# Patient Record
Sex: Female | Born: 1935 | Race: White | Hispanic: No | Marital: Married | Smoking: Never smoker
Health system: Southern US, Community
[De-identification: ages and names within clinical notes are randomized; demographics above are authoritative.]

## PROBLEM LIST (undated history)

## (undated) DIAGNOSIS — K8070 Calculus of gallbladder and bile duct without cholecystitis without obstruction: Secondary | ICD-10-CM

## (undated) DIAGNOSIS — J310 Chronic rhinitis: Secondary | ICD-10-CM

## (undated) DIAGNOSIS — H547 Unspecified visual loss: Secondary | ICD-10-CM

## (undated) DIAGNOSIS — F0390 Unspecified dementia without behavioral disturbance: Secondary | ICD-10-CM

## (undated) DIAGNOSIS — Z9889 Other specified postprocedural states: Secondary | ICD-10-CM

## (undated) DIAGNOSIS — I10 Essential (primary) hypertension: Secondary | ICD-10-CM

## (undated) DIAGNOSIS — J189 Pneumonia, unspecified organism: Secondary | ICD-10-CM

## (undated) DIAGNOSIS — J383 Other diseases of vocal cords: Secondary | ICD-10-CM

## (undated) DIAGNOSIS — A0472 Enterocolitis due to Clostridium difficile, not specified as recurrent: Secondary | ICD-10-CM

## (undated) DIAGNOSIS — H579 Unspecified disorder of eye and adnexa: Secondary | ICD-10-CM

## (undated) DIAGNOSIS — M349 Systemic sclerosis, unspecified: Secondary | ICD-10-CM

## (undated) DIAGNOSIS — H409 Unspecified glaucoma: Secondary | ICD-10-CM

## (undated) DIAGNOSIS — L719 Rosacea, unspecified: Secondary | ICD-10-CM

## (undated) DIAGNOSIS — I7300 Raynaud's syndrome without gangrene: Secondary | ICD-10-CM

## (undated) DIAGNOSIS — G250 Essential tremor: Secondary | ICD-10-CM

## (undated) HISTORY — DX: Chronic rhinitis: J31.0

## (undated) HISTORY — DX: Raynaud's syndrome without gangrene: I73.00

## (undated) HISTORY — DX: Rosacea, unspecified: L71.9

## (undated) HISTORY — DX: Calculus of gallbladder and bile duct without cholecystitis without obstruction: K80.70

## (undated) HISTORY — DX: Unspecified glaucoma: H40.9

## (undated) HISTORY — DX: Pneumonia, unspecified organism: J18.9

## (undated) HISTORY — DX: Unspecified visual loss: H54.7

## (undated) HISTORY — PX: CORNEAL TRANSPLANT: SHX108

## (undated) HISTORY — DX: Unspecified disorder of eye and adnexa: H57.9

## (undated) HISTORY — DX: Other diseases of vocal cords: J38.3

## (undated) HISTORY — DX: Other specified postprocedural states: Z98.890

## (undated) HISTORY — DX: Unspecified dementia, unspecified severity, without behavioral disturbance, psychotic disturbance, mood disturbance, and anxiety: F03.90

## (undated) HISTORY — DX: Essential tremor: G25.0

## (undated) HISTORY — DX: Enterocolitis due to Clostridium difficile, not specified as recurrent: A04.72

## (undated) HISTORY — DX: Systemic sclerosis, unspecified: M34.9

---

## 1980-12-06 HISTORY — PX: LUNG SURGERY: SHX703

## 2000-01-07 ENCOUNTER — Encounter: Payer: Self-pay | Admitting: Internal Medicine

## 2000-01-07 ENCOUNTER — Encounter: Admission: RE | Admit: 2000-01-07 | Discharge: 2000-01-07 | Payer: Self-pay | Admitting: Internal Medicine

## 2000-08-30 ENCOUNTER — Encounter (INDEPENDENT_AMBULATORY_CARE_PROVIDER_SITE_OTHER): Payer: Self-pay | Admitting: Specialist

## 2000-08-30 ENCOUNTER — Ambulatory Visit (HOSPITAL_COMMUNITY): Admission: RE | Admit: 2000-08-30 | Discharge: 2000-08-30 | Payer: Self-pay | Admitting: Obstetrics and Gynecology

## 2001-01-09 ENCOUNTER — Encounter: Admission: RE | Admit: 2001-01-09 | Discharge: 2001-01-09 | Payer: Self-pay | Admitting: Obstetrics and Gynecology

## 2001-01-09 ENCOUNTER — Encounter: Payer: Self-pay | Admitting: Obstetrics and Gynecology

## 2001-02-08 ENCOUNTER — Other Ambulatory Visit: Admission: RE | Admit: 2001-02-08 | Discharge: 2001-02-08 | Payer: Self-pay | Admitting: Obstetrics and Gynecology

## 2002-01-18 ENCOUNTER — Encounter: Admission: RE | Admit: 2002-01-18 | Discharge: 2002-01-18 | Payer: Self-pay | Admitting: Internal Medicine

## 2002-01-18 ENCOUNTER — Encounter: Payer: Self-pay | Admitting: Internal Medicine

## 2002-03-19 ENCOUNTER — Other Ambulatory Visit: Admission: RE | Admit: 2002-03-19 | Discharge: 2002-03-19 | Payer: Self-pay

## 2003-01-25 ENCOUNTER — Encounter: Payer: Self-pay | Admitting: Internal Medicine

## 2003-01-25 ENCOUNTER — Encounter: Admission: RE | Admit: 2003-01-25 | Discharge: 2003-01-25 | Payer: Self-pay | Admitting: Internal Medicine

## 2003-05-27 ENCOUNTER — Other Ambulatory Visit: Admission: RE | Admit: 2003-05-27 | Discharge: 2003-05-27 | Payer: Self-pay

## 2004-01-28 ENCOUNTER — Ambulatory Visit (HOSPITAL_COMMUNITY): Admission: RE | Admit: 2004-01-28 | Discharge: 2004-01-28 | Payer: Self-pay | Admitting: Obstetrics and Gynecology

## 2004-08-17 ENCOUNTER — Other Ambulatory Visit: Admission: RE | Admit: 2004-08-17 | Discharge: 2004-08-17 | Payer: Self-pay | Admitting: Obstetrics and Gynecology

## 2005-01-28 ENCOUNTER — Ambulatory Visit (HOSPITAL_COMMUNITY): Admission: RE | Admit: 2005-01-28 | Discharge: 2005-01-28 | Payer: Self-pay | Admitting: Internal Medicine

## 2005-02-03 ENCOUNTER — Encounter: Admission: RE | Admit: 2005-02-03 | Discharge: 2005-02-03 | Payer: Self-pay | Admitting: Internal Medicine

## 2005-08-26 ENCOUNTER — Other Ambulatory Visit: Admission: RE | Admit: 2005-08-26 | Discharge: 2005-08-26 | Payer: Self-pay | Admitting: Obstetrics and Gynecology

## 2005-12-06 DIAGNOSIS — J189 Pneumonia, unspecified organism: Secondary | ICD-10-CM

## 2005-12-06 DIAGNOSIS — K8070 Calculus of gallbladder and bile duct without cholecystitis without obstruction: Secondary | ICD-10-CM

## 2005-12-06 HISTORY — DX: Calculus of gallbladder and bile duct without cholecystitis without obstruction: K80.70

## 2005-12-06 HISTORY — DX: Pneumonia, unspecified organism: J18.9

## 2006-01-27 ENCOUNTER — Encounter: Admission: RE | Admit: 2006-01-27 | Discharge: 2006-01-27 | Payer: Self-pay | Admitting: Internal Medicine

## 2006-02-07 ENCOUNTER — Encounter: Admission: RE | Admit: 2006-02-07 | Discharge: 2006-02-07 | Payer: Self-pay | Admitting: Internal Medicine

## 2006-07-24 ENCOUNTER — Emergency Department (HOSPITAL_COMMUNITY): Admission: EM | Admit: 2006-07-24 | Discharge: 2006-07-24 | Payer: Self-pay | Admitting: Emergency Medicine

## 2006-08-29 ENCOUNTER — Other Ambulatory Visit: Admission: RE | Admit: 2006-08-29 | Discharge: 2006-08-29 | Payer: Self-pay | Admitting: Obstetrics and Gynecology

## 2006-10-06 DIAGNOSIS — A0472 Enterocolitis due to Clostridium difficile, not specified as recurrent: Secondary | ICD-10-CM

## 2006-10-06 HISTORY — DX: Enterocolitis due to Clostridium difficile, not specified as recurrent: A04.72

## 2007-02-09 ENCOUNTER — Encounter: Admission: RE | Admit: 2007-02-09 | Discharge: 2007-02-09 | Payer: Self-pay | Admitting: Internal Medicine

## 2007-10-30 ENCOUNTER — Encounter: Admission: RE | Admit: 2007-10-30 | Discharge: 2007-10-30 | Payer: Self-pay | Admitting: Internal Medicine

## 2008-03-13 ENCOUNTER — Encounter: Admission: RE | Admit: 2008-03-13 | Discharge: 2008-03-13 | Payer: Self-pay | Admitting: Internal Medicine

## 2009-03-14 ENCOUNTER — Encounter: Admission: RE | Admit: 2009-03-14 | Discharge: 2009-03-14 | Payer: Self-pay | Admitting: Internal Medicine

## 2009-05-01 ENCOUNTER — Emergency Department (HOSPITAL_COMMUNITY): Admission: EM | Admit: 2009-05-01 | Discharge: 2009-05-01 | Payer: Self-pay | Admitting: Emergency Medicine

## 2009-08-22 ENCOUNTER — Encounter: Admission: RE | Admit: 2009-08-22 | Discharge: 2009-08-22 | Payer: Self-pay | Admitting: Neurology

## 2010-04-06 ENCOUNTER — Encounter: Admission: RE | Admit: 2010-04-06 | Discharge: 2010-04-06 | Payer: Self-pay | Admitting: Internal Medicine

## 2010-08-14 ENCOUNTER — Ambulatory Visit (HOSPITAL_COMMUNITY): Admission: RE | Admit: 2010-08-14 | Discharge: 2010-08-14 | Payer: Self-pay | Admitting: Family Medicine

## 2010-08-21 ENCOUNTER — Encounter: Admission: RE | Admit: 2010-08-21 | Discharge: 2010-08-21 | Payer: Self-pay | Admitting: Otolaryngology

## 2010-09-05 DIAGNOSIS — Z9889 Other specified postprocedural states: Secondary | ICD-10-CM

## 2010-09-05 HISTORY — DX: Other specified postprocedural states: Z98.890

## 2010-09-10 ENCOUNTER — Inpatient Hospital Stay (HOSPITAL_BASED_OUTPATIENT_CLINIC_OR_DEPARTMENT_OTHER): Admission: RE | Admit: 2010-09-10 | Discharge: 2010-09-10 | Payer: Self-pay | Admitting: Cardiology

## 2010-12-27 ENCOUNTER — Encounter: Payer: Self-pay | Admitting: Internal Medicine

## 2011-02-18 LAB — POCT I-STAT 3, VENOUS BLOOD GAS (G3P V)
Acid-Base Excess: 1 mmol/L (ref 0.0–2.0)
Acid-base deficit: 1 mmol/L (ref 0.0–2.0)
Bicarbonate: 25.2 mEq/L — ABNORMAL HIGH (ref 20.0–24.0)
O2 Saturation: 71 %
TCO2: 27 mmol/L (ref 0–100)
TCO2: 27 mmol/L (ref 0–100)
pH, Ven: 7.358 — ABNORMAL HIGH (ref 7.250–7.300)
pO2, Ven: 36 mmHg (ref 30.0–45.0)
pO2, Ven: 38 mmHg (ref 30.0–45.0)

## 2011-03-15 ENCOUNTER — Other Ambulatory Visit: Payer: Self-pay | Admitting: Internal Medicine

## 2011-03-15 DIAGNOSIS — Z1231 Encounter for screening mammogram for malignant neoplasm of breast: Secondary | ICD-10-CM

## 2011-04-08 ENCOUNTER — Ambulatory Visit
Admission: RE | Admit: 2011-04-08 | Discharge: 2011-04-08 | Disposition: A | Discharge status: Home / Self Care | Payer: Medicare Other | Source: Ambulatory Visit | Attending: Internal Medicine | Admitting: Internal Medicine

## 2011-04-08 DIAGNOSIS — Z1231 Encounter for screening mammogram for malignant neoplasm of breast: Secondary | ICD-10-CM

## 2011-04-23 NOTE — H&P (Signed)
[facility]  Patient:    Audrey Obrien, Audrey Obrien                          MRN: [id number] Attending:  Esmeralda Arthur, M.D.                         History and Physical  HISTORY OF PRESENT ILLNESS:   This is a 77 year old female, para 2-0-2, who is admitted to the hospital for hysteroscopy and D&C for irregular bleeding. This patient takes Premarin and Provera and had an ultrasound done in July of 2000 which showed a solid endometrial stripe of 3 mm.  The ovaries were normal size.  It was hard to visualize.  Because she has had spotting again, she was admitted for tissue evaluation.  ALLERGIES:                    No known drug allergies.  Her last Pap smear was in March of 2001 which was reported as normal.  Her last period was August 16, 2000.  She has had no abnormal spotting since then.  She has had no hot flushes.  MEDICATIONS:                  1. Premarin 0.625 everyday.                               2. Medroxyprogesterone 10 mg 1 through 10.                               3. Plendil 10 mg everyday.                               4. Propanolol 80 mg everyday for blood pressure.                               5. Medicine for her eyes.  She has had no surgery since her last visit.  REVIEW OF SYSTEMS:            Basically within normal limits except her hypertension.  PERSONAL PAST HISTORY:        She used to take medicine for blood pressure. SHe has glaucoma.  FAMILY HISTORY:               Her mother had a stroke at age 48.  She has sisters and the mother had high blood pressure.  There is no history of cancer.  PHYSICAL EXAMINATION:  GENERAL:                      This is a well-developed, well-nourished female, oriented and alert.  VITAL SIGNS:                  Blood pressure 150/70, weight 130 pounds.  HEART:                        Normal sinus rhythm.  LUNGS:                        Clear to auscultation and percussion.  BREASTS:  Type IV without masses.  ABDOMEN:                      Liver is not palpable.  Spleen is not palpable.  PELVIC:                       External genitalia within normal limits.  The vagina has some slight estrogen effect.  She has a cystocele.  She has a minimal rectocele.  Her cervix is epithelialized.  Uterus feels normal size. Ovaries are not palpable.  Adnexa without masses.  IMPRESSION:                   Irregular bleeding, postmenopausal, on hormone replacement therapy, hypertensive cardiovascular disease on treatment.  DISPOSITION:                  Admit for hysteroscopy and D&C. DD:  08/29/00 TD:  08/29/00 Job: [id number]

## 2011-04-23 NOTE — Op Note (Signed)
[facility]  Patient:    Audrey Obrien, Audrey Obrien                        MRN: [id number] Proc. Date: 08/30/00 Adm. Date:  [id number] Disc. Date: [id number] Attending:  Amanda Cockayne                           Operative Report  PREOPERATIVE DIAGNOSIS:  Irregular bleeding, postmenopausal, hormone replacement therapy, slightly enlarged uterus.  POSTOPERATIVE DIAGNOSIS:  Irregular bleeding, postmenopausal, hormone replacement therapy, slightly enlarged uterus.  OPERATION PERFORMED:  SURGEON:  Esmeralda Arthur, M.D.  ANESTHESIA:  General.  PACKS:  None.  CATHETERS:  None.  MEDIUM:  Sorbitol deficit 30 cc.  ESTIMATED BLOOD LOSS:  30 cc.  OPERATIVE FINDINGS:  The patients uterus sounded to 80 cm.  She had loose endometrial tissue ____________ polyps and she had an irregular lining that causes her to bleed.  DESCRIPTION OF PROCEDURE:  The patient was taken to the operating room and after satisfactory anesthesia, placed in lithotomy position, prepped and draped in sterile field.  The bladder was emptied by catheterization.  The patients uterus was felt to be midlevel.  A weighted speculum was placed in the posterior vagina, cervix grasped with a tenaculum and the uterus sounded to 8 cm easily.  We then dilated to a #25 Hanks dilator and inserted the hysteroscope.  The tubal openings were negative.  They had good opening.  She had three or four areas of loose endometrial tissue which could have caused her to have bleeding.  I could not definitely see a polyp.  I then explored the uterus with Randall stone grasping forceps and did endometrial curettage with a small amount of tissue obtained.  I then looked again with the scope and it looked like she had a polyp on her left which was probably from doing the curettage.  We then removed some tissue on the left.  After we looked again and half the tissues removed.  I plan to see what the  pathology report is of this and cycle the patient cycly for two or three months to see if she can shed the lining and put her back on her original medication. DD:  08/30/00 TD:  08/31/00 Job: [id number]

## 2011-07-06 ENCOUNTER — Other Ambulatory Visit: Payer: Self-pay | Admitting: Geriatric Medicine

## 2011-07-06 ENCOUNTER — Ambulatory Visit
Admission: RE | Admit: 2011-07-06 | Discharge: 2011-07-06 | Disposition: A | Discharge status: Home / Self Care | Payer: Medicare Other | Source: Ambulatory Visit | Attending: Geriatric Medicine | Admitting: Geriatric Medicine

## 2011-07-06 MED ORDER — IOHEXOL 300 MG/ML  SOLN
100.0000 mL | Freq: Once | INTRAMUSCULAR | Status: AC | PRN
  Administered 2011-07-06: 100 mL via INTRAVENOUS

## 2011-09-02 ENCOUNTER — Emergency Department (HOSPITAL_COMMUNITY): Payer: Medicare Other

## 2011-09-02 ENCOUNTER — Encounter (HOSPITAL_COMMUNITY): Payer: Self-pay | Admitting: Radiology

## 2011-09-02 ENCOUNTER — Inpatient Hospital Stay (HOSPITAL_COMMUNITY)
Admission: EM | Admit: 2011-09-02 | Discharge: 2011-09-03 | DRG: 192 | Disposition: A | Discharge status: Home / Self Care | Payer: Medicare Other | Attending: Internal Medicine | Admitting: Internal Medicine

## 2011-09-02 DIAGNOSIS — J479 Bronchiectasis, uncomplicated: Principal | ICD-10-CM | POA: Diagnosis present

## 2011-09-02 DIAGNOSIS — R05 Cough: Secondary | ICD-10-CM

## 2011-09-02 DIAGNOSIS — K219 Gastro-esophageal reflux disease without esophagitis: Secondary | ICD-10-CM | POA: Diagnosis present

## 2011-09-02 DIAGNOSIS — I10 Essential (primary) hypertension: Secondary | ICD-10-CM | POA: Diagnosis present

## 2011-09-02 DIAGNOSIS — M810 Age-related osteoporosis without current pathological fracture: Secondary | ICD-10-CM | POA: Diagnosis present

## 2011-09-02 DIAGNOSIS — G250 Essential tremor: Secondary | ICD-10-CM | POA: Diagnosis present

## 2011-09-02 DIAGNOSIS — Z79899 Other long term (current) drug therapy: Secondary | ICD-10-CM

## 2011-09-02 DIAGNOSIS — G252 Other specified forms of tremor: Secondary | ICD-10-CM | POA: Diagnosis present

## 2011-09-02 DIAGNOSIS — R042 Hemoptysis: Secondary | ICD-10-CM

## 2011-09-02 DIAGNOSIS — R911 Solitary pulmonary nodule: Secondary | ICD-10-CM

## 2011-09-02 DIAGNOSIS — M349 Systemic sclerosis, unspecified: Secondary | ICD-10-CM | POA: Diagnosis present

## 2011-09-02 HISTORY — DX: Essential (primary) hypertension: I10

## 2011-09-02 LAB — POCT I-STAT, CHEM 8
Calcium, Ion: 1.24 mmol/L (ref 1.12–1.32)
Creatinine, Ser: 0.8 mg/dL (ref 0.50–1.10)
Hemoglobin: 11.6 g/dL — ABNORMAL LOW (ref 12.0–15.0)
Sodium: 130 mEq/L — ABNORMAL LOW (ref 135–145)
TCO2: 23 mmol/L (ref 0–100)

## 2011-09-02 LAB — CBC
Hemoglobin: 10.9 g/dL — ABNORMAL LOW (ref 12.0–15.0)
MCH: 32.6 pg (ref 26.0–34.0)
MCHC: 35.4 g/dL (ref 30.0–36.0)
Platelets: 269 10*3/uL (ref 150–400)
RDW: 13.6 % (ref 11.5–15.5)

## 2011-09-02 LAB — COMPREHENSIVE METABOLIC PANEL
Albumin: 3.6 g/dL (ref 3.5–5.2)
Alkaline Phosphatase: 95 U/L (ref 39–117)
BUN: 13 mg/dL (ref 6–23)
Creatinine, Ser: 0.6 mg/dL (ref 0.50–1.10)
GFR calc Af Amer: >60 mL/min (ref >60)
Glucose, Bld: 96 mg/dL (ref 70–99)
Total Protein: 7.2 g/dL (ref 6.0–8.3)

## 2011-09-02 LAB — DIFFERENTIAL
Basophils Absolute: 0 10*3/uL (ref 0.0–0.1)
Basophils Relative: 0 % (ref 0–1)
Eosinophils Absolute: 0.2 10*3/uL (ref 0.0–0.7)
Eosinophils Relative: 3 % (ref 0–5)
Monocytes Absolute: 1.1 10*3/uL — ABNORMAL HIGH (ref 0.1–1.0)
Monocytes Relative: 17 % — ABNORMAL HIGH (ref 3–12)
Neutro Abs: 4.6 10*3/uL (ref 1.7–7.7)

## 2011-09-02 LAB — TYPE AND SCREEN
ABO/RH(D): A POS
Antibody Screen: NEGATIVE

## 2011-09-02 LAB — PROTIME-INR
INR: 1.03 (ref 0.00–1.49)
Prothrombin Time: 13.7 seconds (ref 11.6–15.2)

## 2011-09-02 MED ORDER — IOHEXOL 300 MG/ML  SOLN
80.0000 mL | Freq: Once | INTRAMUSCULAR | Status: AC | PRN
  Administered 2011-09-02: 80 mL via INTRAVENOUS

## 2011-09-03 ENCOUNTER — Encounter (HOSPITAL_COMMUNITY): Payer: Medicare Other

## 2011-09-03 ENCOUNTER — Other Ambulatory Visit: Payer: Self-pay | Admitting: Emergency Medicine

## 2011-09-03 ENCOUNTER — Inpatient Hospital Stay (HOSPITAL_COMMUNITY): Payer: Medicare Other

## 2011-09-03 ENCOUNTER — Other Ambulatory Visit: Payer: Self-pay | Admitting: Internal Medicine

## 2011-09-03 LAB — BASIC METABOLIC PANEL
BUN: 16 mg/dL (ref 6–23)
CO2: 25 mEq/L (ref 19–32)
Chloride: 94 mEq/L — ABNORMAL LOW (ref 96–112)
GFR calc non Af Amer: >60 mL/min (ref >60)
Glucose, Bld: 103 mg/dL — ABNORMAL HIGH (ref 70–99)
Potassium: 4 mEq/L (ref 3.5–5.1)

## 2011-09-03 LAB — CBC
Hemoglobin: 12.2 g/dL (ref 12.0–15.0)
MCH: 32.5 pg (ref 26.0–34.0)
MCHC: 35.4 g/dL (ref 30.0–36.0)
MCV: 92 fL (ref 78.0–100.0)
Platelets: 297 10*3/uL (ref 150–400)
RBC: 3.75 MIL/uL — ABNORMAL LOW (ref 3.87–5.11)

## 2011-09-03 LAB — SURGICAL PCR SCREEN: MRSA, PCR: NEGATIVE

## 2011-09-03 LAB — ABO/RH: ABO/RH(D): A POS

## 2011-09-03 LAB — EXPECTORATED SPUTUM ASSESSMENT W GRAM STAIN, RFLX TO RESP C

## 2011-09-06 LAB — CULTURE, RESPIRATORY W GRAM STAIN

## 2011-09-10 NOTE — Op Note (Signed)
NAMEEVEY, MCMAHAN NO.:  [id number]  MEDICAL RECORD NO.:  [id number]  LOCATION:  5025                         FACILITY:  [facility]  PHYSICIAN:  Leslye Peer, MD    DATE OF BIRTH:  [birth year]  DATE OF PROCEDURE:  09/03/2011 DATE OF DISCHARGE:                              OPERATIVE REPORT   PROCEDURE:  Video fiberoptic bronchoscopy with bronchoalveolar lavage.  INDICATIONS:  Hemoptysis with abnormal CT scan of the chest.  OPERATOR:  Leslye Peer, MD  MEDICATIONS GIVEN:  Fentanyl 25 mcg IV, Versed 3 mg IV in divided doses, lidocaine 1% to the bronchoalveolar tree.  CONSENT:  Informed consent was obtained from the patient and a signed copy was on her hospital chart.  PROCEDURE DETAILS:  After informed consent was obtained, a formal time- out was performed and then conscious sedation was initiated as outlined above.  The bronchoscope was introduced into both nares to confirm that there was no evidence of epistaxis, no bleeding, either active or evidence for old bleeding was noted.  The bronchoscope was then introduced through the oropharynx and the posterior pharynx was visualized.  This was normal in appearance with some mild posterior pharyngeal cobblestoning.  The glottis and epiglottis were normal.  The false and true cords moved normally with inspiration and with phonation. The area was anesthetized with 1% lidocaine and then the trachea was intubated.  The proximal trachea was normal in appearance.  The bronchoscope was advanced to the main carina which was sharp without any significant abnormality.  Inspection of the left side showed a normal left mainstem bronchus.  The left upper lobe and lingular airways were all within normal limits.  There was a small amount of old blood noted in the left lower lobe which was easily suctioned away and there was no reaccumulation.  I suspect that this was blood-tinged mucous that had originated from elsewhere.   Attention was then turned to the right-sided exam.  The right mainstem bronchus was normal.  The right upper lobe had some thick old blood emanating from the posterior subsegment.  The right middle lobe and right lower lobe airways were completely normal.  The blood emanating from the posterior segment of the right upper lobe was suctioned without difficulty.  No endobronchial lesions could be seen. Once the blood was removed, some evidence of old blood emanating from the airways was noted but the scope would not pass into the smaller caliber airways to see its source.  A bronchoalveolar lavage was performed in the posterior segment of the right upper lobe to be sent for cytology and microbiology including AFB and fungal smears, 60 mL of normal saline was instilled and 20 mL was returned.  After the bronchoalveolar lavage, some fresh bright red blood was noted emanating from the posterior segmental airway.  This was suctioned away and there was good hemostasis at the end of the case.  No biopsies were performed. The patient tolerated the procedure well with the exception of some coughing.  There were no obvious complications.  She was recovered in the endoscopy suite before returning to her hospital room.  SAMPLES:  Bronchoalveolar lavage from the  posterior segment of the right upper lobe to be sent for AFB smear and culture, fungal smear and culture, bacterial culture, and cytology.  PLAN:  I would like to follow up the bronchoalveolar lavage results with Ms. Goodspeed when they mature.  I will follow up with her in the hospital if she remains an inpatient.  If she is to be discharged from the hospital before the results are final, please arrange for her to follow up with Dr. Levy Pupa at [facility] to go over these final results in detail.  The phone number is [phone].     Leslye Peer, MD     RSB/MEDQ  D:  09/03/2011  T:  09/03/2011  Job:  [id number]  Electronically  Signed by Levy Pupa MD on 09/10/2011 02:06:48 PM

## 2011-09-14 ENCOUNTER — Encounter: Payer: Self-pay | Admitting: Pulmonary Disease

## 2011-09-15 ENCOUNTER — Ambulatory Visit (INDEPENDENT_AMBULATORY_CARE_PROVIDER_SITE_OTHER): Payer: Medicare Other | Admitting: Emergency Medicine

## 2011-09-15 ENCOUNTER — Encounter: Payer: Self-pay | Admitting: Emergency Medicine

## 2011-09-15 DIAGNOSIS — R05 Cough: Secondary | ICD-10-CM

## 2011-09-15 DIAGNOSIS — J479 Bronchiectasis, uncomplicated: Secondary | ICD-10-CM

## 2011-09-15 DIAGNOSIS — K219 Gastro-esophageal reflux disease without esophagitis: Secondary | ICD-10-CM

## 2011-09-15 DIAGNOSIS — J309 Allergic rhinitis, unspecified: Secondary | ICD-10-CM | POA: Insufficient documentation

## 2011-09-15 MED ORDER — LORATADINE 10 MG PO TABS: 10.0000 mg | ORAL_TABLET | Freq: Every day | ORAL | Status: DC

## 2011-09-15 MED ORDER — FLUTICASONE PROPIONATE 50 MCG/ACT NA SUSP: 2.0000 | Freq: Two times a day (BID) | NASAL | Status: DC

## 2011-09-15 NOTE — Assessment & Plan Note (Signed)
-   follow cx data to completion; I believe the pseudomonas is a colonizer, want to r/o [facility] - repeat CT scan in march 2013  - rov after

## 2011-09-15 NOTE — Progress Notes (Signed)
Subjective:    Patient ID: Audrey Obrien, female    DOB: 11/16/1931, 77 y.o.   MRN: [id number]  HPI 77 yo never smoker w hx scleroderma, HTN, GERD, chronic cough (2-3 yrs). 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 I saw her in the hospital 9/27 for cough and hemoptysis. CT scan of the chest 9/27 showed nodular parenchymal opacity within the RUL with a  'tree in bud ' appearance, progressed compared with 06/2011. FOB done 9/28 showed old blood in the RUL posterior segment, BAL has been smear negative for AFB and fungal (final cx pending). The bacterial cx grew sensitive pseudomonas. Cytology on BAL negative. She has nasal gtt and a lot of throat mucous. Has tried nasonex before, not sure it helped. Not currently on anything. Started mucinex a week ago.    Review of Systems  Constitutional: Negative.  Negative for fever and unexpected weight change.  HENT: Positive for congestion. Negative for ear pain, nosebleeds, sore throat, rhinorrhea, sneezing, trouble swallowing, dental problem, postnasal drip and sinus pressure.   Eyes: Negative.  Negative for redness and itching.  Respiratory: Positive for cough. Negative for chest tightness, shortness of breath and wheezing.   Cardiovascular: Negative.  Negative for palpitations and leg swelling.  Gastrointestinal: Negative.  Negative for nausea and vomiting.  Genitourinary: Negative.  Negative for dysuria.  Musculoskeletal: Positive for joint swelling and arthralgias.  Skin: Negative.  Negative for rash.  Neurological: Negative.  Negative for headaches.  Hematological: Negative.  Does not bruise/bleed easily.  Psychiatric/Behavioral: Negative.  Negative for dysphoric mood. The patient is not nervous/anxious.     Past Medical History  Diagnosis Date  . Hypertension   . Acne rosacea   . Chronic rhinitis     no benefit from antihistamines  . Gallbladder & bile duct stone 2007    1.6 cm GB  stone; no symptoms  . Pneumonia 2007  . 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 C. difficile colitis 11/07  . Spastic dysphonia     botox injs at [facility]  . Hypertension   . Scleroderma     probable, ANA+ 1:160, anticentromers strongly pos, USRNP-, TH/TO-,RF equivocal, CCP-, Skin puffiness of hands, telangectasias of hands, raynauds, ECHO increased pulmonary pressures 8/11, pfts stable  . S/P right heart catheterization 10/11  . Benign essential tremor   . Osteoporosis     started alendronate 6/12     Family History  Problem Relation Age of Onset  . Asthma Son      History   Social History  . Marital Status: Married    Spouse Name: N/A    Number of Children: N/A  . Years of Education: N/A   Occupational History  . retired Ship broker   Social History Main Topics  . Smoking status: Never Smoker   . Smokeless tobacco: Not on file  . Alcohol Use: No  . Drug Use: No  . Sexually Active: Not on file   Other Topics Concern  . Not on file   Social History Narrative  . No narrative on file     No Known Allergies   Outpatient Prescriptions Prior to Visit  Medication Sig Dispense Refill  . alendronate (FOSAMAX) 70 MG tablet Take 70 mg by mouth every 7 (seven) days. Take with a full glass of water on an empty stomach.       . Ascorbic Acid (  VITAMIN C) 1000 MG tablet Take 1,000 mg by mouth daily.        Marland Kitchen aspirin 81 MG chewable tablet Chew 81 mg by mouth daily.        . Calcium Carbonate-Vitamin D (CALTRATE 600+D) 600-400 MG-UNIT per tablet Take 2 tablets by mouth daily.        . Cholecalciferol (VITAMIN D) 1000 UNITS capsule Take 1,000 Units by mouth daily.        . Cranberry (CRAN-MAX) 500 MG CAPS Take by mouth. Take as directed       . esomeprazole (NEXIUM) 40 MG capsule Take 40 mg by mouth daily.        . fish oil-omega-3 fatty acids 1000 MG capsule Take 1 g by mouth daily.        Marland Kitchen METRONIDAZOLE, TOPICAL, (METROLOTION) 0.75 % LOTN Apply topically.  As directed twice a day       . Multiple Vitamin (MULTIVITAMIN) tablet Take 1 tablet by mouth daily.        . nisoldipine (SULAR) 40 MG 24 hr tablet Take 40 mg by mouth daily.        . primidone (MYSOLINE) 50 MG tablet Take 50 mg by mouth. 2 tabs once a day       . propranolol (INDERAL LA) 80 MG 24 hr capsule Take 80 mg by mouth. 2 caps once a day       . telmisartan (MICARDIS) 80 MG tablet Take 80 mg by mouth daily.               Objective:   Physical Exam  Gen: Pleasant, well-nourished, in no distress,  normal affect  ENT: No lesions,  mouth clear,  oropharynx clear, gravel voice quality, some postnasal drip  Neck: No JVD, no TMG, no carotid bruits  Lungs: No use of accessory muscles, no dullness to percussion, clear without rales or rhonchi  Cardiovascular: RRR, heart sounds normal, no murmur or gallops, no peripheral edema  Musculoskeletal: No deformities, no cyanosis or clubbing  Neuro: alert, non focal, slightly slow to respond, mild tremor.   Skin: Warm, no lesions or rashes      Assessment & Plan:  Bronchiectasis without acute exacerbation - follow cx data to completion; I believe the pseudomonas is a colonizer, want to r/o [facility] - repeat CT scan in march 2013  - rov after  Chronic cough Suspect a contribution from allergic rhinitis and draiinage. ? Also a component of aspiration and poor airway protection after her botox injections - the cough seemed to start when the botox was started. The RUL bronchiectasis is likely also a factor.   Chronic allergic rhinitis - guifenesin - start loratadine and fluticasone - start [place]  GERD (gastroesophageal reflux disease) - nexium

## 2011-09-15 NOTE — Patient Instructions (Signed)
Continue your Mucinex Start loratadine 10mg  daily Start fluticasone 2 sprays each nostril twice a day Start nasal saline washes once a day We will repeat your CT scan in March 2013 Follow up with Dr Delton Coombes in march after your CT scan or sooner if you notice worsening symptoms, cough up blood.

## 2011-09-15 NOTE — Assessment & Plan Note (Signed)
nexium 

## 2011-09-15 NOTE — Assessment & Plan Note (Signed)
Suspect a contribution from allergic rhinitis and draiinage. ? Also a component of aspiration and poor airway protection after her botox injections - the cough seemed to start when the botox was started. The RUL bronchiectasis is likely also a factor.

## 2011-09-15 NOTE — Assessment & Plan Note (Signed)
-   guifenesin - start loratadine and fluticasone - start [employer]

## 2011-09-16 NOTE — Discharge Summary (Signed)
  NAMEANHAR, MCDERMOTT NO.:  [id number]  MEDICAL RECORD NO.:  [id number]  LOCATION:  5025                         FACILITY:  [facility]  PHYSICIAN:  Gordy Savers, MDDATE OF BIRTH:  12/02/1931  DATE OF ADMISSION:  09/02/2011 DATE OF DISCHARGE:  09/03/2011                              DISCHARGE SUMMARY   FINAL DIAGNOSES: 1. Hemoptysis. 2. Bronchiectasis.  ADDITIONAL DIAGNOSES: 1. Scleroderma. 2. Gastroesophageal flux disease. 3. Osteoporosis. 4. Chronic benign essential tremor. 5. Hypertension.  DISCHARGE MEDICATIONS: 1. Fosamax 70 mg weekly. 2. Primidone 50 mg one b.i.d. 3. Inderal 80 mg b.i.d. 4. Nexium 40 mg daily. 5. Sular 40 mg daily. 6. Micardis 80 mg daily.  HISTORY OF PRESENT ILLNESS:  The patient is a 77 year old female with past medical history of scleroderma, diagnosed in 2010.  She has a history of chronic cough and suspected bronchiectasis and began having hemoptysis a few days prior to admission.  This was approximated to be approximately a tablespoon in volume, described as bright red, and associated with coughing.  The patient had been seen as an outpatient and evaluation included a chest x-ray and chest CT that revealed scattered airspace disease, and findings consistent with bronchiectasis. The patient was subsequently admitted for further evaluation and treatment of her hemoptysis.  LABORATORY DATA AND HOSPITAL COURSE:  On the second hospital day, the patient underwent fiberoptic bronchoscopy without complications.  There was scattered old blood present in several bronchial segments.  Multiple specimens for culture and cytology obtained.  BAL was performed.  There is no active bleeding.  Laboratory studies revealed a serum sodium of 130, H and H 12.2, hematocrit 34.5, white count was normal at 6.7. Chemistries were unremarkable.  The patient was observed several hours post her bronchoscopy and remained clinically stable.  At  the time of discharge, bronchoscopy specimens for cytology, routine culture, fungal culture pending.  AFB culture pending.  AFB stain was negative for acid-fast bacilli.  DISPOSITION:  The patient will be discharged today with Pulmonary followup at [facility] within the next week.  She will report any further hemoptysis and will report to the ED if there is a large volume hemoptysis.  She will be discharged on her preadmission medications.     Gordy Savers, MD     PFK/MEDQ  D:  09/03/2011  T:  09/03/2011  Job:  [id number]  Electronically Signed by Eleonore Chiquito MD on 09/16/2011 09:14:48 AM

## 2011-09-17 ENCOUNTER — Telehealth: Payer: Self-pay | Admitting: Emergency Medicine

## 2011-09-17 NOTE — Telephone Encounter (Signed)
I spoke with Audrey Obrien and they are wanting the okay from Audrey Obrien for Audrey Obrien to get Audrey flu shot. I advised them he is not back in until 10/29. Audrey Obrien Obrien was fine with that bc they want the okay from Audrey Obrien. Dr. Delton Coombes, please advise thanks  Carver Fila, CMA

## 2011-09-18 NOTE — Discharge Summary (Signed)
  Audrey Obrien, HECKMANN NO.:  [id number]  MEDICAL RECORD NO.:  [id number]  LOCATION:  5025                         FACILITY:  [facility]  PHYSICIAN:  Theressa Millard, M.D.    DATE OF BIRTH:  [birth year]  DATE OF ADMISSION:  09/02/2011 DATE OF DISCHARGE:  09/03/2011                              DISCHARGE SUMMARY   ADMITTING DIAGNOSIS:  Hemoptysis.  DISCHARGE DIAGNOSES: 1. Hemoptysis secondary to right upper lobe process, status post     bronchoscopy with cultures pending. 2. Scleroderma. 3. Spastic dysphonia. 4. Hypertension. 5. Osteoporosis.  The patient is a 77 year old white female who has had a history of right upper lobe process that is being followed in the office.  She had scant hemoptysis and presented to the emergency department.  She was admitted for expedited a bronchoscopy due to hemoptysis.  She was seen in consultation by Dr. Delton Coombes who agreed the patient needed to undergo bronchoscopy and this was done on the morning of discharge. The bronchoscopy was well tolerated and initial AFB smear was negative. Therefore, she was discharged in improved condition to followup with Dr. Delton Coombes next week.  DISCHARGE MEDICATIONS: 1. Fosamax 70 mg weekly. 2. Primidone 50 mg b.i.d. 3. Inderal 80 mg b.i.d. 4. Nexium 40 mg daily. 5. Sular 40 mg daily. 6. Micardis 80 mg daily.  She will follow up with Dr. Delton Coombes next week.  She will follow up with Dr. Earl Gala as well and that appointment has been scheduled.  ACTIVITY:  As tolerated.  DIET:  No restrictions.     Theressa Millard, M.D.     JO/MEDQ  D:  09/10/2011  T:  09/10/2011  Job:  [id number]  Electronically Signed by Theressa Millard M.D. on 09/18/2011 08:47:41 PM

## 2011-09-22 ENCOUNTER — Telehealth: Payer: Self-pay | Admitting: Emergency Medicine

## 2011-09-22 MED ORDER — ETHAMBUTOL HCL 400 MG PO TABS: 400.0000 mg | ORAL_TABLET | Freq: Three times a day (TID) | ORAL | Status: DC

## 2011-09-22 MED ORDER — RIFAMPIN 300 MG PO CAPS: 300.0000 mg | ORAL_CAPSULE | Freq: Every day | ORAL | Status: AC

## 2011-09-22 MED ORDER — CLARITHROMYCIN 500 MG PO TABS: 500.0000 mg | ORAL_TABLET | Freq: Two times a day (BID) | ORAL | Status: DC

## 2011-09-22 NOTE — Telephone Encounter (Signed)
Per Dr. Delton Coombes, culture results from bronchoscopy showed [facility] and the pt will need to start on abx regimen for this. Dr. Delton Coombes spoke with Dr. Theressa Millard regarding results so he would be aware.   Pt should start:  Clarithromycin 500 mg BID                            Ethambutol 400 mg TID                            Rifampin 300 mg QD  RX has been sent to [facility]. Pt needs follow-up in 3-4 weeks with RB to see how she is doing.  Pt aware of results and had no questions at this time. She is scheduled for follow-up with RB on Tues., 10/12/11 @ 3:45pm. Pt will call if she has any trouble after starting abx regimen. Will forward msg to RB so he can sign off on this msg and is aware pt has been notified.

## 2011-09-22 NOTE — Telephone Encounter (Signed)
Pt aware okay for flu shot per Dr. Delton Coombes.

## 2011-09-22 NOTE — Telephone Encounter (Signed)
I spoke with Dr. Delton Coombes and he said it is fine for the pt to get flu shot.

## 2011-09-22 NOTE — H&P (Signed)
NAMEHENSLEE, LOTTMAN NO.:  [id number]  MEDICAL RECORD NO.:  [id number]  LOCATION:                                 FACILITY:  PHYSICIAN:  Marcellus Scott, MD     DATE OF BIRTH:  11-22-1931  DATE OF ADMISSION:  09/03/2011 DATE OF DISCHARGE:                             HISTORY & PHYSICAL   PRIMARY CARE PHYSICIAN:  Theressa Millard, MD  RHEUMATOLOGIST:  Zenovia Jordan, MD  CHIEF COMPLAINT:  Coughing up blood.  HISTORY OF PRESENT ILLNESS:  Ms. Gover is a pleasant 77 year old Caucasian female patient with a history of scleroderma, hypertension, benign tremors, gastroesophageal reflux disease, Botox injections to the neck for her voice.  She indicates that she has had a chronic cough for approximately 2 years.  This is mostly nonproductive.  It is not associated with chest pain or dyspnea or fevers or chills or weight loss or night sweats.  For this chronic cough, she was evaluated in July 2012 with the CAT scan, which showed a right upper lobe abnormality suggestive of pneumonia.  The patient received a course of antibiotics, but it made no change in her cough.  She was in her usual state of health until 2 days ago when she started coughing up dark-colored blood. She also noted blood on her pillow when she woke up in the morning. Initially, they did not make much about it.  However, she started to having repeated episodes of coughing and coughed up about 10-12 times with approximately a teaspoon to a tablespoon of blood, which was dark each time.  She continues to deny any difficulty breathing or fever or chills or chest pain.  She saw Dr. Elby Showers yesterday afternoon and had a chest x-ray done at the office.  She was called this morning and was asked to come to the emergency room for further evaluation and management.  In the emergency room, her CT scan showed a right upper lobe abnormality and the [facility] were requested to admit her for further  evaluation and management.  PAST MEDICAL HISTORY: 1. Scleroderma. 2. Hypertension. 3. Benign tremor. 4. Gastroesophageal reflux disease. 5. Osteoporosis. 6. Treated for pneumonia in July 2012. 7. Chronic cough. 8. Botox injections.  PAST SURGICAL HISTORY: 1. False eye placement on the right eye.  She had lost her eye during     childbirth. 2. Cataract surgery, left eye. 3. Kidney stone surgery by Dr. Vonita Moss in 1982.  ALLERGIES:  NO KNOWN DRUG ALLERGIES.  MEDICATIONS: 1. Fosamax 70 mg p.o. weekly. 2. Lubricant eyedrops over-the-counter 1 drop in each eye daily. 3. Vitamin D over-the-counter 1 tablet p.o. daily. 4. Primidone 100 mg p.o. daily. 5. Inderal 160 mg p.o. daily. 6. Nexium 40 mg p.o. daily. 7. Sular 40 mg p.o. daily. 8. Micardis 80 mg p.o. daily.  FAMILY HISTORY:  The patient's mother died at age 1.  She had hypertension and cerebrovascular accident.  The patient's father died at 17 years of age.  SOCIAL HISTORY:  The patient is married and her husband is at her bedside.  She is a retired Solicitor.  She is independent of activities of daily living.  She  has never smoked.  She drinks wine occasionally. There is no history of drug abuse.  ADVANCE DIRECTIVES:  Full code.  REVIEW OF SYSTEMS:  All systems reviewed and apart from history of presenting illness are negative.  PHYSICAL EXAMINATION:  GENERAL:  Ms. Vecchiarelli is a thinly-built and nourished female patient who is in no obvious distress. VITAL SIGNS:  Temperature 98.2 degrees Fahrenheit, blood pressure 146/74 mmHg, pulse 66 per minute, respirations 18 per minute, saturating at 97% on room air. HEAD/ENT:  Head is nontraumatic, normocephalic.  Right eye is a false eye.  Left pupil is 3 mm and reactive to light.  Oral mucosa is moist and there is no evidence of blood at this time. NECK:  Supple.  No JVD or carotid bruit. LYMPHATICS:  No lymphadenopathy. RESPIRATORY SYSTEM:  Question of bronchial breath sounds  in the right upper anterior lung fields, but otherwise clear to auscultation and no increased work of breathing. CARDIOVASCULAR SYSTEM:  First and second heart sounds heard, regular rate and rhythm.  No JVD or murmurs. ABDOMEN:  Nondistended, nontender, soft and normal bowel sounds heard. No organomegaly or mass appreciated. CENTRAL NERVOUS SYSTEM:  The patient is awake, alert, oriented x3 with no focal neurological deficits. EXTREMITIES:  With grade 5/5 power. SKIN:  Without any rashes.  LABORATORY DATA:  CBC shows hemoglobin of 10.9, hematocrit of 30.8, white blood cell of 6.7, platelets 269.  Basic metabolic panel significant for sodium 130, potassium 4.2, chloride 96, bicarbonate 23, BUN 16, creatinine 0.80, and glucose of 99.  CT of the chest in February 2007 showed nodular opacities in the right upper lobe.  CT of the chest in July 2012 shows patchy infiltrate in the right upper lobe.  CT of the chest done today shows nodular right upper lobe airspace disease "tree-in-bud."  ASSESSMENT AND PLAN: 1. Hemoptysis is possibly from right upper lobe bronchiectasis, the     etiology of which at this time is not certain.  The patient is     admitted to the medical floor.  We will obtain PT, PTT, type and     screen, place two large bore IV accesses.  Pulmonary physicians     have been consulted and plan for a fiberoptic bronchoscopy     tomorrow.  The other differential diagnosis obviously are     scleroderma, Mycobacterium avium complex and tuberculosis, although     index of suspicion for this is low.  At this time, we will hold     antibiotics because there is no clinical index of suspicion for an     acute infection. 2. Anemia.  Follow daily CBCs. 3. Hyponatremia.  Unclear etiology.  Question of syndrome of     inappropriate secretion of antidiuretic hormone.  Follow BMET in     the a.m.; and if it persists, then continue further evaluation. 4. Hypertension.  Continue home  medications. 5. Tremors.  Continue home medications.  The patient currently does     not have any tremors.  CODE STATUS:  Full code.  It was a pleasure admitting Ms. Grosz.  Her care has been discussed with her spouse at the bedside.  Her care will be resumed by Dr. Theressa Millard in the morning.  Time taken in coordinating this history and physical note is 1 hour.     Marcellus Scott, MD     AH/MEDQ  D:  09/02/2011  T:  09/02/2011  Job:  [id number]  cc:   Theressa Millard, M.D. Marylene Land  Nickola Major, MD Leslye Peer, MD  Electronically Signed by Marcellus Scott MD on 09/22/2011 08:03:56 PM

## 2011-10-01 LAB — FUNGUS CULTURE W SMEAR

## 2011-10-11 LAB — AFB CULTURE WITH SMEAR (NOT AT ARMC): Acid Fast Smear: NONE SEEN

## 2011-10-12 ENCOUNTER — Encounter: Payer: Self-pay | Admitting: Emergency Medicine

## 2011-10-12 ENCOUNTER — Ambulatory Visit (INDEPENDENT_AMBULATORY_CARE_PROVIDER_SITE_OTHER): Payer: Medicare Other | Admitting: Emergency Medicine

## 2011-10-12 VITALS — BP 118/50 | HR 65 | Temp 98.3°F | Ht 61.0 in | Wt 122.8 lb

## 2011-10-12 DIAGNOSIS — J309 Allergic rhinitis, unspecified: Secondary | ICD-10-CM

## 2011-10-12 DIAGNOSIS — J479 Bronchiectasis, uncomplicated: Secondary | ICD-10-CM

## 2011-10-12 MED ORDER — LORATADINE 10 MG PO TABS: 10.0000 mg | ORAL_TABLET | Freq: Every day | ORAL | Status: DC

## 2011-10-12 MED ORDER — FLUTICASONE PROPIONATE 50 MCG/ACT NA SUSP: 2.0000 | Freq: Two times a day (BID) | NASAL | Status: DC

## 2011-10-12 NOTE — Assessment & Plan Note (Signed)
Loratadine + fluticasone

## 2011-10-12 NOTE — Patient Instructions (Signed)
Continue the clarithromycin, ethambutol, rifampin for now.  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  If you still have rash after 5 days, call our office. We will have to change the antibiotics.  Continue your fluticasone nasal spray and loratadine.  Follow with Dr Delton Coombes in 4 months after your CT scan

## 2011-10-12 NOTE — Progress Notes (Signed)
  Subjective:    Patient ID: Audrey Obrien, female    DOB: 26-Mar-1931, 77 y.o.   MRN: [id number]  HPI 77 yo never smoker w hx scleroderma, HTN, GERD, chronic cough (2-3 yrs). 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 I saw her in the hospital 9/27 for cough and hemoptysis. CT scan of the chest 9/27 showed nodular parenchymal opacity within the RUL with a  'tree in bud ' appearance, progressed compared with 06/2011. FOB done 9/28 showed old blood in the RUL posterior segment, BAL has been smear negative for AFB and fungal (final cx pending). The bacterial cx grew sensitive pseudomonas. Cytology on BAL negative. She has nasal gtt and a lot of throat mucous. Has tried nasonex before, not sure it helped. Not currently on anything. Started mucinex a week ago.   ROV 10/12/11 -- scleroderma, chronic cough, bronchiectasis. BAL done 9/28 = [facility]. Started on ethambutol, rifampin, clarithromycin on 09/22/11. She has tolerated the antibiotics, has had a single episode diarrhea, has noticed a rash on her legs. Remains on the loratadine and fluticasone.  She believes that her mucous production is improved.      Objective:   Physical Exam  Gen: Pleasant, well-nourished, in no distress,  normal affect  ENT: No lesions,  mouth clear,  oropharynx clear, gravel voice quality, some postnasal drip  Neck: No JVD, no TMG, no carotid bruits  Lungs: No use of accessory muscles, no dullness to percussion, clear without rales or rhonchi  Cardiovascular: RRR, heart sounds normal, no murmur or gallops, no peripheral edema  Musculoskeletal: No deformities, no cyanosis or clubbing  Neuro: alert, non focal, slightly slow to respond, mild tremor.   Skin: Warm, no lesions or rashes      Assessment & Plan:  Chronic allergic rhinitis Loratadine + fluticasone  Bronchiectasis without acute exacerbation With [facility] comfirmed by FOB 09/03/11. Has a rash, ? Due to 3  drug rx. Will have her call if it persists and may need to change the abx regimen. Repeat CT scan planned for March 2013

## 2011-10-12 NOTE — Assessment & Plan Note (Signed)
With [facility] comfirmed by FOB 09/03/11. Has a rash, ? Due to 3 drug rx. Will have her call if it persists and may need to change the abx regimen. Repeat CT scan planned for March 2013

## 2011-10-14 ENCOUNTER — Telehealth: Payer: Self-pay | Admitting: Emergency Medicine

## 2011-10-14 NOTE — Telephone Encounter (Signed)
LMOMTCB x 1 

## 2011-10-14 NOTE — Telephone Encounter (Signed)
Husband aware to stop all 3 meds and will await RB's call for subset of meds

## 2011-10-14 NOTE — Telephone Encounter (Signed)
Called and spoke with pt's husband.  Husband states pt was recently seen by RB on 11/6 and at that time had a rash on both calves.  Husband states RB thought maybe rash was d/t pt's 3 abx she is currently on but wanted to wait and see if rash worsened/improved/or unchanged.  Husband calling today stating rash is much worse.  States it has covered her body except for her face.  Husband states tongue is red but pt denies any difficulty breathing, swelling of tongue or throat, or difficulty swallowing.  Husband requesting RB's recs.  Husband aware pt should seek emergency care if symptoms worsened and needed immediate attention.    Husband stated he also wanted to speak directly to RB regarding a few questions he had on pt's dx.  He states he did some research on the internet regarding her dx and is worried about pt being exposed to a lot of bacteria and contaminated water in their well that they use which could have caused her dx.  RB, please advise.  Thanks.

## 2011-10-14 NOTE — Telephone Encounter (Signed)
Please ask her to stop all 3 meds for now (clarithro, rifampin, ethambutol). I will have to restart a subset of them after we see the rash resolve. I will try to call husband later today or tomorrow. Thanks

## 2011-10-15 LAB — AFB CULTURE WITH SMEAR (NOT AT ARMC): Acid Fast Smear: NONE SEEN

## 2011-10-15 NOTE — Telephone Encounter (Signed)
Called pt's husband - no answer, LMOM. Will call back this pm or next week. RSB

## 2011-10-19 NOTE — Telephone Encounter (Signed)
RB, I spoke with pt's spouse and he states that pt has d/c'ed all of the abx and she still has red rash all over her body. The rash is no worse, but not improving and spouse wants RB to call him back. Please advise, thanks

## 2011-10-19 NOTE — Telephone Encounter (Signed)
Patient's husband calling with questions about what next step is with wife.  [phone]

## 2011-10-20 NOTE — Telephone Encounter (Signed)
Pt's spouse wants to hear back today re: recs  Re: abx. Hazel Sams

## 2011-10-20 NOTE — Telephone Encounter (Signed)
I spoke with Audrey Obrien-husband to patient-states that the rash patient was having has cleared up for the most part and would like to speak with RB for recs. [name] is aware that RB is out of the office this afternoon and will have him address this in the morning. I have sent this message to RB and Audrey Obrien to follow through on.

## 2011-10-21 NOTE — Telephone Encounter (Signed)
scheduled pt to see dr byrum tomorrow at 11:45--husband aware of appt

## 2011-10-21 NOTE — Telephone Encounter (Signed)
[name] calling again in reference to previous notes very irate, can be reached at [phone].Audrey Obrien

## 2011-10-22 ENCOUNTER — Ambulatory Visit (INDEPENDENT_AMBULATORY_CARE_PROVIDER_SITE_OTHER): Payer: Medicare Other | Admitting: Emergency Medicine

## 2011-10-22 ENCOUNTER — Encounter: Payer: Self-pay | Admitting: Emergency Medicine

## 2011-10-22 VITALS — BP 128/70 | HR 70 | Temp 98.3°F | Ht 61.0 in | Wt 117.8 lb

## 2011-10-22 DIAGNOSIS — J479 Bronchiectasis, uncomplicated: Secondary | ICD-10-CM

## 2011-10-22 NOTE — Patient Instructions (Signed)
Restart clarithromycin. Continue this if no rash appears.  If doing well in 2 weeks, then start the ethambutol also.  If you develop rash, stop the new medicine and call our office Follow up in 1 month

## 2011-10-22 NOTE — Progress Notes (Signed)
  Subjective:    Patient ID: Audrey Obrien, female    DOB: [birth year], 77 y.o.   MRN: [id number]  HPI 77 yo never smoker w hx scleroderma, HTN, GERD, chronic cough (2-3 yrs). 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 I saw her in the hospital 9/27 for cough and hemoptysis. CT scan of the chest 9/27 showed nodular parenchymal opacity within the RUL with a  'tree in bud ' appearance, progressed compared with 06/2011. FOB done 9/28 showed old blood in the RUL posterior segment, BAL has been smear negative for AFB and fungal (final cx pending). The bacterial cx grew sensitive pseudomonas. Cytology on BAL negative. She has nasal gtt and a lot of throat mucous. Has tried nasonex before, not sure it helped. Not currently on anything. Started mucinex a week ago.   ROV 10/12/11 -- scleroderma, chronic cough, bronchiectasis. BAL done 9/28 = [facility]. Started on ethambutol, rifampin, clarithromycin on 09/22/11. She has tolerated the antibiotics, has had a single episode diarrhea, has noticed a rash on her legs. Remains on the loratadine and fluticasone.  She believes that her mucous production is improved.   ROV 10/21/11 - scleroderma, chronic cough, bronchiectasis. Dx with [facility] on BAL. Started 3 drug rx but she developed rash, had to stop all 3 meds. Returns to discuss next plans. She says the rash is better, has low appetite.     Objective:   Physical Exam  Gen: Pleasant, well-nourished, in no distress,  normal affect  ENT: No lesions,  mouth clear,  oropharynx clear, gravel voice quality, some postnasal drip  Neck: No JVD, no TMG, no carotid bruits  Lungs: No use of accessory muscles, no dullness to percussion, clear without rales or rhonchi  Cardiovascular: RRR, heart sounds normal, no murmur or gallops, no peripheral edema  Musculoskeletal: No deformities, no cyanosis or clubbing  Neuro: alert, non focal, slightly slow to respond, mild tremor.     Skin: Warm, no lesions or rashes      Assessment & Plan:  Bronchiectasis without acute exacerbation With [facility] on BAL.  Rash on clarithro, ethambutol, rifampin.  - will restart clarithro first, if tolerated then add the ethambutol in 2 weeks.  - rov 1 month

## 2011-10-22 NOTE — Assessment & Plan Note (Signed)
With [facility] on BAL.  Rash on clarithro, ethambutol, rifampin.  - will restart clarithro first, if tolerated then add the ethambutol in 2 weeks.  - rov 1 month

## 2011-11-15 ENCOUNTER — Other Ambulatory Visit: Payer: Self-pay | Admitting: Internal Medicine

## 2011-11-15 DIAGNOSIS — R634 Abnormal weight loss: Secondary | ICD-10-CM

## 2011-11-16 ENCOUNTER — Telehealth: Payer: Self-pay | Admitting: Emergency Medicine

## 2011-11-16 MED ORDER — MAGIC MOUTHWASH: 5.0000 mL | Freq: Four times a day (QID) | ORAL | Status: DC | PRN

## 2011-11-16 NOTE — Telephone Encounter (Signed)
Spoke with pt's spouse. He states that the pt has thrush since has been on abx since Oct 2012. Her mouth and tongue are very sore and "blood red". He states that she needs something called in asap as she is very uncomfortable. Will forward this to doc of the day since RB not in this pm. SN, pls advise, thanks!

## 2011-11-16 NOTE — Telephone Encounter (Signed)
LMTCB

## 2011-11-16 NOTE — Telephone Encounter (Signed)
Per SN--ok to give MMW  #4oz   1 tsp gargle and swallow four times daily as needed and a good idea to take a probiotic once daily like align.  thanks

## 2011-11-16 NOTE — Telephone Encounter (Signed)
Caller aware and medication called to pharmacy.

## 2011-11-17 ENCOUNTER — Ambulatory Visit
Admission: RE | Admit: 2011-11-17 | Discharge: 2011-11-17 | Disposition: A | Discharge status: Home / Self Care | Payer: Medicare Other | Source: Ambulatory Visit | Attending: Internal Medicine | Admitting: Internal Medicine

## 2011-11-17 DIAGNOSIS — R634 Abnormal weight loss: Secondary | ICD-10-CM

## 2011-11-17 MED ORDER — IOHEXOL 300 MG/ML  SOLN
100.0000 mL | Freq: Once | INTRAMUSCULAR | Status: AC | PRN
  Administered 2011-11-17: 100 mL via INTRAVENOUS

## 2011-11-17 NOTE — Telephone Encounter (Signed)
Thank you :)

## 2011-11-19 ENCOUNTER — Telehealth: Payer: Self-pay | Admitting: Emergency Medicine

## 2011-11-19 NOTE — Telephone Encounter (Signed)
The best way to figure out if it is related to the medication is to stop it and follow for resolution of symptoms. It is also possible that it is [facility] itself that is causing the symptoms. Agree with having her stop clarithro, keep track of sx and then follow with me as planned on the 17th.

## 2011-11-19 NOTE — Telephone Encounter (Signed)
Per last ov note:   Patient Instructions     Restart clarithromycin. Continue this if no rash appears.  If doing well in 2 weeks, then start the ethambutol also.  If you develop rash, stop the new medicine and call our office  Follow up in 1 month     Which abx is pt currently taking?  biaxin is also on her med list as historical.  LMOM TCB x1.  Pt has upcoming appt with RB 12.17.12 @ 1:45pm.

## 2011-11-19 NOTE — Telephone Encounter (Signed)
pts spouse called back and stated that the pt has been on clarithromycin, biaxin and is to start on rifampin tomorrow.  pts spouse has spoke with Dr. Earl Gala and Dr. Sandria Manly and they seem to think per pts spouse that her symptoms are coming from one of the abx that the pt is on.  Spouse stated that he has not given the pt any of the abx today and will not over the weekend unless RB recs that he restart them.  Pt does have appt with RB on Monday at 1:45.  Pt is staying cold all the time and no appetite.  RB please advise.  thanks

## 2011-11-19 NOTE — Telephone Encounter (Signed)
Called and spoke with pts spouse and he is aware to hold the meds for the weekend and follow pts symptoms.  Keep appt with RB on Monday and he voiced his understanding of this.  He will call on call doctor if needed over the weekend.

## 2011-11-22 ENCOUNTER — Ambulatory Visit (INDEPENDENT_AMBULATORY_CARE_PROVIDER_SITE_OTHER): Payer: Medicare Other | Admitting: Emergency Medicine

## 2011-11-22 ENCOUNTER — Encounter: Payer: Self-pay | Admitting: Emergency Medicine

## 2011-11-22 DIAGNOSIS — R05 Cough: Secondary | ICD-10-CM

## 2011-11-22 DIAGNOSIS — J479 Bronchiectasis, uncomplicated: Secondary | ICD-10-CM

## 2011-11-22 NOTE — Assessment & Plan Note (Signed)
Appears to be better, although I do not believe that her Zoila Shutter has been treated

## 2011-11-22 NOTE — Progress Notes (Signed)
  Subjective:    Patient ID: Audrey Obrien, female    DOB: Apr 06, 1931, 77 y.o.   MRN: [id number]  HPI 77 yo never smoker w hx scleroderma, HTN, GERD, chronic cough (2-3 yrs). 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 I saw her in the hospital 9/27 for cough and hemoptysis. CT scan of the chest 9/27 showed nodular parenchymal opacity within the RUL with a  'tree in bud ' appearance, progressed compared with 06/2011. FOB done 9/28 showed old blood in the RUL posterior segment, BAL has been smear negative for AFB and fungal (final cx pending). The bacterial cx grew sensitive pseudomonas. Cytology on BAL negative. She has nasal gtt and a lot of throat mucous. Has tried nasonex before, not sure it helped. Not currently on anything. Started mucinex a week ago.   ROV 10/12/11 -- scleroderma, chronic cough, bronchiectasis. BAL done 9/28 = [facility]. Started on ethambutol, rifampin, clarithromycin on 09/22/11. She has tolerated the antibiotics, has had a single episode diarrhea, has noticed a rash on her legs. Remains on the loratadine and fluticasone.  She believes that her mucous production is improved.   ROV 10/21/11 - scleroderma, chronic cough, bronchiectasis. Dx with [facility] on BAL. Started 3 drug rx but she developed rash, had to stop all 3 meds. Returns to discuss next plans. She says the rash is better, has low appetite.   ROV 11/22/11 -- scleroderma, chronic cough, bronchiectasis. Dx with [facility] on BAL. Attempted to start clarithro alone (due to rash on 3 drug rx), but we had to stop it due to lack of appetite, nausea. Stopped it 3 days ago. She was on      Objective:   Physical Exam  Gen: Pleasant, well-nourished, in no distress,  normal affect  ENT: No lesions,  mouth clear,  oropharynx clear, gravel voice quality, some postnasal drip  Neck: No JVD, no TMG, no carotid bruits  Lungs: No use of accessory muscles, no dullness to percussion, clear  without rales or rhonchi  Cardiovascular: RRR, heart sounds normal, no murmur or gallops, no peripheral edema  Musculoskeletal: No deformities, no cyanosis or clubbing  Neuro: alert, non focal, slightly slow to respond, mild tremor.   Skin: Warm, no lesions or rashes      Assessment & Plan:  Bronchiectasis without acute exacerbation She tolerated clarithro x 2 weeks, then had poor PO and nausea 2 weeks after adding ethambutol.  - try no abx x 1 month, then try going back on clarithro alone. I am suspicious that the ethambutol is causing nausea, rifampin caused rash.  - if unable to make this regimen work, then I will refer to ID.   Chronic cough Appears to be better, although I do not believe that her Zoila Shutter has been treated

## 2011-11-22 NOTE — Assessment & Plan Note (Addendum)
She tolerated clarithro x 2 weeks, then had poor PO and nausea 2 weeks after adding ethambutol.  - try no abx x 1 month, then try going back on clarithro alone. I am suspicious that the ethambutol is causing nausea, rifampin caused rash.  - if unable to make this regimen work, then I will refer to ID.

## 2011-11-22 NOTE — Patient Instructions (Addendum)
Please don't take any antibiotics until December 21, 2011.  Go back on your clarithromycin on 12/21/11.  Follow with Dr Delton Coombes in February 2013

## 2011-12-29 ENCOUNTER — Other Ambulatory Visit: Payer: Self-pay | Admitting: Emergency Medicine

## 2012-01-04 ENCOUNTER — Other Ambulatory Visit (HOSPITAL_COMMUNITY): Payer: Self-pay | Admitting: Adult Health

## 2012-01-04 DIAGNOSIS — N83209 Unspecified ovarian cyst, unspecified side: Secondary | ICD-10-CM

## 2012-01-05 ENCOUNTER — Other Ambulatory Visit (HOSPITAL_COMMUNITY): Payer: Self-pay | Admitting: Adult Health

## 2012-01-05 ENCOUNTER — Ambulatory Visit (HOSPITAL_COMMUNITY)
Admission: RE | Admit: 2012-01-05 | Discharge: 2012-01-05 | Disposition: A | Discharge status: Home / Self Care | Payer: Medicare Other | Source: Ambulatory Visit | Attending: Adult Health | Admitting: Adult Health

## 2012-01-05 DIAGNOSIS — N83209 Unspecified ovarian cyst, unspecified side: Secondary | ICD-10-CM

## 2012-01-05 DIAGNOSIS — N9489 Other specified conditions associated with female genital organs and menstrual cycle: Secondary | ICD-10-CM | POA: Insufficient documentation

## 2012-01-06 ENCOUNTER — Other Ambulatory Visit (HOSPITAL_COMMUNITY): Payer: Medicare Other

## 2012-01-24 ENCOUNTER — Ambulatory Visit: Payer: Medicare Other | Admitting: Emergency Medicine

## 2012-02-11 ENCOUNTER — Telehealth: Payer: Self-pay | Admitting: Emergency Medicine

## 2012-02-11 NOTE — Telephone Encounter (Signed)
Lauren called back & asked to leave a different number & name to be contacted for this pt.  Please ask for Dr. Tania Ade and call [phone].  Antionette Fairy

## 2012-02-11 NOTE — Telephone Encounter (Signed)
Per call with Dr Tania Ade [phone] Dr Delton Coombes is aware pt is in [facility] and Dr Delton Coombes will contact Dr Tania Ade about Patient .

## 2012-02-15 ENCOUNTER — Inpatient Hospital Stay
Admission: RE | Admit: 2012-02-15 | Payer: Medicare Other | Source: Ambulatory Visit | Attending: Emergency Medicine | Admitting: Emergency Medicine

## 2012-03-28 ENCOUNTER — Encounter (HOSPITAL_COMMUNITY): Payer: Self-pay | Admitting: *Deleted

## 2012-03-28 ENCOUNTER — Inpatient Hospital Stay (HOSPITAL_COMMUNITY): Payer: Medicare Other

## 2012-03-28 ENCOUNTER — Inpatient Hospital Stay (HOSPITAL_COMMUNITY)
Admission: EM | Admit: 2012-03-28 | Discharge: 2012-04-04 | DRG: 644 | Disposition: A | Discharge status: Rehabilitation Facility | Payer: Medicare Other | Attending: Internal Medicine | Admitting: Internal Medicine

## 2012-03-28 DIAGNOSIS — M7989 Other specified soft tissue disorders: Secondary | ICD-10-CM

## 2012-03-28 DIAGNOSIS — G20A1 Parkinson's disease without dyskinesia, without mention of fluctuations: Secondary | ICD-10-CM | POA: Diagnosis present

## 2012-03-28 DIAGNOSIS — Z947 Corneal transplant status: Secondary | ICD-10-CM

## 2012-03-28 DIAGNOSIS — K219 Gastro-esophageal reflux disease without esophagitis: Secondary | ICD-10-CM | POA: Diagnosis present

## 2012-03-28 DIAGNOSIS — N3090 Cystitis, unspecified without hematuria: Secondary | ICD-10-CM | POA: Diagnosis present

## 2012-03-28 DIAGNOSIS — I10 Essential (primary) hypertension: Secondary | ICD-10-CM

## 2012-03-28 DIAGNOSIS — Z79899 Other long term (current) drug therapy: Secondary | ICD-10-CM

## 2012-03-28 DIAGNOSIS — R531 Weakness: Secondary | ICD-10-CM | POA: Diagnosis present

## 2012-03-28 DIAGNOSIS — M349 Systemic sclerosis, unspecified: Secondary | ICD-10-CM | POA: Diagnosis present

## 2012-03-28 DIAGNOSIS — R5381 Other malaise: Secondary | ICD-10-CM | POA: Diagnosis present

## 2012-03-28 DIAGNOSIS — I2789 Other specified pulmonary heart diseases: Secondary | ICD-10-CM | POA: Diagnosis present

## 2012-03-28 DIAGNOSIS — Z993 Dependence on wheelchair: Secondary | ICD-10-CM

## 2012-03-28 DIAGNOSIS — R1310 Dysphagia, unspecified: Secondary | ICD-10-CM | POA: Diagnosis present

## 2012-03-28 DIAGNOSIS — G40909 Epilepsy, unspecified, not intractable, without status epilepticus: Secondary | ICD-10-CM

## 2012-03-28 DIAGNOSIS — E236 Other disorders of pituitary gland: Principal | ICD-10-CM | POA: Diagnosis present

## 2012-03-28 DIAGNOSIS — E871 Hypo-osmolality and hyponatremia: Secondary | ICD-10-CM

## 2012-03-28 DIAGNOSIS — E8809 Other disorders of plasma-protein metabolism, not elsewhere classified: Secondary | ICD-10-CM | POA: Diagnosis present

## 2012-03-28 DIAGNOSIS — R609 Edema, unspecified: Secondary | ICD-10-CM | POA: Diagnosis not present

## 2012-03-28 DIAGNOSIS — G931 Anoxic brain damage, not elsewhere classified: Secondary | ICD-10-CM | POA: Diagnosis present

## 2012-03-28 DIAGNOSIS — N390 Urinary tract infection, site not specified: Secondary | ICD-10-CM

## 2012-03-28 DIAGNOSIS — Z7982 Long term (current) use of aspirin: Secondary | ICD-10-CM

## 2012-03-28 DIAGNOSIS — T420X1A Poisoning by hydantoin derivatives, accidental (unintentional), initial encounter: Secondary | ICD-10-CM | POA: Diagnosis present

## 2012-03-28 DIAGNOSIS — G20 Parkinson's disease: Secondary | ICD-10-CM | POA: Diagnosis present

## 2012-03-28 DIAGNOSIS — E860 Dehydration: Secondary | ICD-10-CM | POA: Diagnosis present

## 2012-03-28 DIAGNOSIS — M810 Age-related osteoporosis without current pathological fracture: Secondary | ICD-10-CM | POA: Diagnosis present

## 2012-03-28 DIAGNOSIS — T420X5A Adverse effect of hydantoin derivatives, initial encounter: Secondary | ICD-10-CM | POA: Diagnosis present

## 2012-03-28 DIAGNOSIS — J479 Bronchiectasis, uncomplicated: Secondary | ICD-10-CM | POA: Diagnosis present

## 2012-03-28 DIAGNOSIS — R600 Localized edema: Secondary | ICD-10-CM

## 2012-03-28 DIAGNOSIS — R339 Retention of urine, unspecified: Secondary | ICD-10-CM | POA: Diagnosis not present

## 2012-03-28 LAB — COMPREHENSIVE METABOLIC PANEL
ALT: 10 U/L (ref 0–35)
AST: 17 U/L (ref 0–37)
Albumin: 2.9 g/dL — ABNORMAL LOW (ref 3.5–5.2)
Alkaline Phosphatase: 109 U/L (ref 39–117)
CO2: 26 mEq/L (ref 19–32)
Chloride: 89 mEq/L — ABNORMAL LOW (ref 96–112)
Creatinine, Ser: 0.65 mg/dL (ref 0.50–1.10)
GFR calc non Af Amer: 82 mL/min — ABNORMAL LOW (ref >90)
Potassium: 4.3 mEq/L (ref 3.5–5.1)
Total Bilirubin: 0.4 mg/dL (ref 0.3–1.2)

## 2012-03-28 LAB — CBC
HCT: 29 % — ABNORMAL LOW (ref 36.0–46.0)
HCT: 28.4 % — ABNORMAL LOW (ref 36.0–46.0)
Hemoglobin: 10 g/dL — ABNORMAL LOW (ref 12.0–15.0)
Hemoglobin: 9.9 g/dL — ABNORMAL LOW (ref 12.0–15.0)
MCH: 33.4 pg (ref 26.0–34.0)
MCHC: 34.5 g/dL (ref 30.0–36.0)
MCHC: 34.9 g/dL (ref 30.0–36.0)
MCV: 95.9 fL (ref 78.0–100.0)
RDW: 14.7 % (ref 11.5–15.5)
WBC: 8.7 10*3/uL (ref 4.0–10.5)

## 2012-03-28 LAB — SODIUM, URINE, RANDOM: Sodium, Ur: 112 mEq/L

## 2012-03-28 LAB — URINE MICROSCOPIC-ADD ON

## 2012-03-28 LAB — BASIC METABOLIC PANEL
BUN: 13 mg/dL (ref 6–23)
CO2: 26 mEq/L (ref 19–32)
Calcium: 8.5 mg/dL (ref 8.4–10.5)
Glucose, Bld: 128 mg/dL — ABNORMAL HIGH (ref 70–99)
Sodium: 124 mEq/L — ABNORMAL LOW (ref 135–145)

## 2012-03-28 LAB — URINALYSIS, ROUTINE W REFLEX MICROSCOPIC
Bilirubin Urine: NEGATIVE
Glucose, UA: NEGATIVE mg/dL
Ketones, ur: NEGATIVE mg/dL
Protein, ur: 30 mg/dL — AB
Urobilinogen, UA: 1 mg/dL (ref 0.0–1.0)

## 2012-03-28 LAB — OSMOLALITY, URINE: Osmolality, Ur: 465 mOsm/kg (ref 390–1090)

## 2012-03-28 LAB — DIFFERENTIAL
Basophils Absolute: 0.2 10*3/uL — ABNORMAL HIGH (ref 0.0–0.1)
Basophils Relative: 2 % — ABNORMAL HIGH (ref 0–1)
Lymphocytes Relative: 11 % — ABNORMAL LOW (ref 12–46)
Monocytes Absolute: 1.9 10*3/uL — ABNORMAL HIGH (ref 0.1–1.0)
Neutro Abs: 5 10*3/uL (ref 1.7–7.7)
Neutrophils Relative %: 57 % (ref 43–77)

## 2012-03-28 MED ORDER — METRONIDAZOLE 0.75 % EX LOTN: 1.0000 "application " | TOPICAL_LOTION | Freq: Two times a day (BID) | CUTANEOUS | Status: DC | PRN

## 2012-03-28 MED ORDER — ACETAMINOPHEN 650 MG RE SUPP: 650.0000 mg | Freq: Four times a day (QID) | RECTAL | Status: DC | PRN

## 2012-03-28 MED ORDER — PRIMIDONE 50 MG PO TABS
50.0000 mg | ORAL_TABLET | Freq: Every day | ORAL | Status: DC
  Administered 2012-03-28 – 2012-04-03 (×7): 50 mg via ORAL
  Filled 2012-03-28 (×8): qty 1

## 2012-03-28 MED ORDER — ASPIRIN EC 81 MG PO TBEC
81.0000 mg | DELAYED_RELEASE_TABLET | Freq: Every day | ORAL | Status: DC
  Administered 2012-03-28 – 2012-04-03 (×7): 81 mg via ORAL
  Filled 2012-03-28 (×8): qty 1

## 2012-03-28 MED ORDER — SODIUM CHLORIDE 0.9 % IV SOLN
INTRAVENOUS | Status: DC
  Administered 2012-03-28: 75 mL/h via INTRAVENOUS
  Administered 2012-03-29 – 2012-03-31 (×4): via INTRAVENOUS
  Administered 2012-03-31: 75 mL/h via INTRAVENOUS
  Administered 2012-03-31: 07:00:00 via INTRAVENOUS

## 2012-03-28 MED ORDER — BIOTENE DRY MOUTH MT LIQD
15.0000 mL | Freq: Two times a day (BID) | OROMUCOSAL | Status: DC
  Administered 2012-03-28 – 2012-04-04 (×14): 15 mL via OROMUCOSAL

## 2012-03-28 MED ORDER — PREDNISOLONE ACETATE 1 % OP SUSP
1.0000 [drp] | Freq: Four times a day (QID) | OPHTHALMIC | Status: DC
  Administered 2012-03-28 – 2012-04-04 (×28): 1 [drp] via OPHTHALMIC
  Filled 2012-03-28 (×2): qty 1

## 2012-03-28 MED ORDER — SODIUM CHLORIDE 0.9 % IV BOLUS (SEPSIS)
500.0000 mL | Freq: Once | INTRAVENOUS | Status: AC
  Administered 2012-03-28: 13:00:00 via INTRAVENOUS

## 2012-03-28 MED ORDER — PRIMIDONE 50 MG PO TABS: 50.0000 mg | ORAL_TABLET | Freq: Two times a day (BID) | ORAL | Status: DC

## 2012-03-28 MED ORDER — HYDROCODONE-ACETAMINOPHEN 5-325 MG PO TABS
1.0000 | ORAL_TABLET | Freq: Four times a day (QID) | ORAL | Status: DC | PRN
  Administered 2012-03-28 – 2012-03-30 (×3): 1 via ORAL
  Filled 2012-03-28 (×3): qty 1

## 2012-03-28 MED ORDER — ONDANSETRON HCL 4 MG PO TABS: 4.0000 mg | ORAL_TABLET | Freq: Four times a day (QID) | ORAL | Status: DC | PRN

## 2012-03-28 MED ORDER — VITAMIN D 1000 UNITS PO CAPS: 1000.0000 [IU] | ORAL_CAPSULE | Freq: Every day | ORAL | Status: DC

## 2012-03-28 MED ORDER — WHITE PETROLATUM GEL
Status: AC
  Administered 2012-03-28: 17:00:00
  Filled 2012-03-28: qty 5

## 2012-03-28 MED ORDER — ENOXAPARIN SODIUM 40 MG/0.4ML ~~LOC~~ SOLN
40.0000 mg | SUBCUTANEOUS | Status: DC
  Administered 2012-03-28 – 2012-04-03 (×7): 40 mg via SUBCUTANEOUS
  Filled 2012-03-28 (×8): qty 0.4

## 2012-03-28 MED ORDER — PHENYTOIN 50 MG PO CHEW
100.0000 mg | CHEWABLE_TABLET | Freq: Three times a day (TID) | ORAL | Status: DC
  Administered 2012-03-28 (×2): 100 mg via ORAL
  Filled 2012-03-28 (×5): qty 2

## 2012-03-28 MED ORDER — ACETAMINOPHEN 325 MG PO TABS
650.0000 mg | ORAL_TABLET | Freq: Four times a day (QID) | ORAL | Status: DC | PRN
  Administered 2012-04-01: 650 mg via ORAL
  Filled 2012-03-28: qty 2

## 2012-03-28 MED ORDER — PANTOPRAZOLE SODIUM 40 MG PO TBEC
80.0000 mg | DELAYED_RELEASE_TABLET | Freq: Every day | ORAL | Status: DC
  Administered 2012-03-29 – 2012-04-04 (×7): 80 mg via ORAL
  Filled 2012-03-28: qty 2
  Filled 2012-03-28: qty 1
  Filled 2012-03-28 (×2): qty 2
  Filled 2012-03-28 (×2): qty 1
  Filled 2012-03-28: qty 2

## 2012-03-28 MED ORDER — ONDANSETRON HCL 4 MG/2ML IJ SOLN: 4.0000 mg | Freq: Four times a day (QID) | INTRAMUSCULAR | Status: DC | PRN

## 2012-03-28 MED ORDER — PRIMIDONE 50 MG PO TABS
100.0000 mg | ORAL_TABLET | Freq: Every day | ORAL | Status: DC
  Administered 2012-03-29 – 2012-04-04 (×7): 100 mg via ORAL
  Filled 2012-03-28 (×7): qty 2

## 2012-03-28 MED ORDER — VITAMIN D3 25 MCG (1000 UNIT) PO TABS
1000.0000 [IU] | ORAL_TABLET | Freq: Every day | ORAL | Status: DC
  Administered 2012-03-29 – 2012-04-04 (×7): 1000 [IU] via ORAL
  Filled 2012-03-28 (×7): qty 1

## 2012-03-28 MED ORDER — SODIUM CHLORIDE 0.9 % IJ SOLN: 3.0000 mL | Freq: Two times a day (BID) | INTRAMUSCULAR | Status: DC

## 2012-03-28 MED ORDER — HYDROMORPHONE HCL PF 1 MG/ML IJ SOLN
0.5000 mg | INTRAMUSCULAR | Status: DC | PRN
  Administered 2012-03-28 – 2012-03-31 (×5): 0.5 mg via INTRAVENOUS
  Filled 2012-03-28 (×5): qty 1

## 2012-03-28 MED ORDER — ASPIRIN 81 MG PO TABS: 81.0000 mg | ORAL_TABLET | Freq: Every day | ORAL | Status: DC

## 2012-03-28 MED ORDER — PROPRANOLOL HCL ER 80 MG PO CP24
80.0000 mg | ORAL_CAPSULE | Freq: Two times a day (BID) | ORAL | Status: DC
  Administered 2012-03-28 – 2012-04-04 (×14): 80 mg via ORAL
  Filled 2012-03-28 (×17): qty 1

## 2012-03-28 MED ORDER — DEXTROSE 5 % IV SOLN
1.0000 g | INTRAVENOUS | Status: DC
  Administered 2012-03-28 – 2012-03-29 (×2): 1 g via INTRAVENOUS
  Filled 2012-03-28 (×3): qty 10

## 2012-03-28 NOTE — ED Notes (Signed)
Family at bedside. 

## 2012-03-28 NOTE — Progress Notes (Signed)
Pt arrived to the unit with dx: vaginal pain. Pt is alert and orient x3. Pt is blind. She is unable to ambulate per her husband without full assist. Pt is continent of bowel and bladder. Pt is able to verbalize her needs. Pt has some excoriation on her vagina and buttocks, barrier cream was applied. Pt's vital signs were stable. Complain of moderate pain in vagina area. No distress present. Husband at bedside. Will cont to monitor.

## 2012-03-28 NOTE — ED Provider Notes (Signed)
Medical screening examination/treatment/procedure(s) were performed by non-physician practitioner and as supervising physician I was immediately available for consultation/collaboration.    Vickie Melnik L Elmar Antigua, MD 03/28/12 2031 

## 2012-03-28 NOTE — ED Provider Notes (Signed)
History     CSN: [id number]  Arrival date & time 03/28/12  4098   First MD Initiated Contact with Patient 03/28/12 [phone]      Chief Complaint  Patient presents with  . Abdominal Pain    (Consider location/radiation/quality/duration/timing/severity/associated sxs/prior treatment) HPI Hx from pt and husband at bedside; hx from pt herself is somewhat limited as she has some dementia. 78 year old female with complex past medical history presents with lower abdominal pain for the past several weeks. Pain is described as burning and is constant in nature. It worsens with voiding. She has had increased frequency of urination. Her husband states that he brought her in today as she was complaining of increased pain.  She has had a hx of recurrent UTIs for years. Husband states that she had a corneal transplant about 3 months ago at [facility]. During that hospitalization, she apparently had a period of altered mental status and was in the ICU for several days with a Foley. Since then, her symptoms of burning and discomfort have been almost constant; husband believes that the Foley may have caused this. She is followed by Dr. Margarita Grizzle with [facility] for this problem. She apparently had a urine culture 3 weeks ago which was negative. Since then, it was recommended that she take an over-the-counter product to help with the burning sensation.  No nausea, vomiting, fevers, chills, back pain. Bowel movements have been normal. She has not complained of chest pain or shortness of breath. Per husband she has no hx of abd surgeries.  Past Medical History  Diagnosis Date  . Hypertension   . Acne rosacea   . Chronic rhinitis     no benefit from antihistamines  . Gallbladder & bile duct stone 2007    1.6 cm GB stone; no symptoms  . Pneumonia 2007  . 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 C. difficile colitis 11/07  . Spastic dysphonia     botox injs at [facility]  .  Hypertension   . Scleroderma     probable, ANA+ 1:160, anticentromers strongly pos, USRNP-, TH/TO-,RF equivocal, CCP-, Skin puffiness of hands, telangectasias of hands, raynauds, ECHO increased pulmonary pressures 8/11, pfts stable  . S/P right heart catheterization 10/11  . Benign essential tremor   . Osteoporosis     started alendronate 6/12    Past Surgical History  Procedure Date  . Lung surgery 1982  . Corneal transplant     Family History  Problem Relation Age of Onset  . Asthma Son     History  Substance Use Topics  . Smoking status: Never Smoker   . Smokeless tobacco: Never Used  . Alcohol Use: No    OB History    Grav Para Term Preterm Abortions TAB SAB Ect Mult Living                  Review of Systems  Constitutional: Negative for fever, chills, activity change and appetite change.  Eyes: Positive for visual disturbance.       Visual disturbance secondary to corneal transplant  Respiratory: Negative for chest tightness and shortness of breath.   Cardiovascular: Negative for chest pain and palpitations.  Gastrointestinal: Positive for abdominal pain. Negative for nausea and vomiting.  Genitourinary: Positive for decreased urine volume and difficulty urinating. Negative for vaginal bleeding and vaginal discharge.  Skin: Negative.   Neurological: Negative for facial asymmetry and light-headedness.    Allergies  Review  of patient's allergies indicates no known allergies.  Home Medications   Current Outpatient Rx  Name Route Sig Dispense Refill  . MAGIC MOUTHWASH Oral Take 5 mLs by mouth 4 (four) times daily as needed. 120 mL 0  . VITAMIN C 1000 MG PO TABS Oral Take 1,000 mg by mouth daily.      . ASPIRIN 81 MG PO TABS Oral Take 81 mg by mouth daily.      Marland Kitchen CALCIUM CARBONATE-VITAMIN D 600-400 MG-UNIT PO TABS Oral Take 2 tablets by mouth daily.      Marland Kitchen VITAMIN D 1000 UNITS PO CAPS Oral Take 1,000 Units by mouth daily.      Marland Kitchen CLARITHROMYCIN 500 MG PO TABS  Oral Take 500 mg by mouth 2 (two) times daily.      Marland Kitchen CLARITHROMYCIN 500 MG PO TABS  TAKE ONE TABLET TWICE DAILY 60 tablet 3  . CRANBERRY 500 MG PO CAPS Oral Take by mouth. Take as directed     . ESOMEPRAZOLE MAGNESIUM 40 MG PO CPDR Oral Take 40 mg by mouth daily.      Marland Kitchen ETHAMBUTOL HCL 400 MG PO TABS Oral Take 1 tablet (400 mg total) by mouth 3 (three) times daily. 90 tablet 1  . OMEGA-3 FATTY ACIDS 1000 MG PO CAPS Oral Take 1 g by mouth daily.      Marland Kitchen FLUTICASONE PROPIONATE 50 MCG/ACT NA SUSP Nasal Place 2 sprays into the nose 2 (two) times daily. 48 g 3  . LORATADINE 10 MG PO TABS Oral Take 1 tablet (10 mg total) by mouth daily. 90 tablet 3  . METRONIDAZOLE 0.75 % EX LOTN Apply externally Apply topically. As directed twice a day     . ONE-DAILY MULTI VITAMINS PO TABS Oral Take 1 tablet by mouth daily.      Marland Kitchen NISOLDIPINE ER 40 MG PO TB24 Oral Take 40 mg by mouth daily.      Marland Kitchen PRIMIDONE 50 MG PO TABS Oral Take 50 mg by mouth at bedtime. 2 tabs once a day    . ALIGN 4 MG PO CAPS Oral Take 1 capsule by mouth daily.      Marland Kitchen PROPRANOLOL HCL ER 80 MG PO CP24 Oral Take 80 mg by mouth. 2 caps once a day     . RIFAMPIN 300 MG PO CAPS Oral Take 300 mg by mouth daily.      . TELMISARTAN 80 MG PO TABS Oral Take 80 mg by mouth daily.        BP 136/69  Pulse 57  Temp(Src) 97.4 F (36.3 C) (Oral)  Resp 22  SpO2 97%  Physical Exam  Nursing note and vitals reviewed. Constitutional: She is oriented to person, place, and time. She appears well-developed and well-nourished. No distress.  HENT:  Head: Normocephalic and atraumatic.       Pt's speech is halting, 2/2 spastic dysphonia  Eyes: Conjunctivae are normal.  Neck: Normal range of motion. Neck supple.  Cardiovascular: Normal rate, regular rhythm and normal heart sounds.   Pulmonary/Chest: Effort normal and breath sounds normal. She exhibits no tenderness.  Abdominal: Soft. Bowel sounds are normal. She exhibits no distension and no mass. There is  tenderness in the suprapubic area. There is no rebound and no guarding.    Musculoskeletal: Normal range of motion.  Lymphadenopathy:    She has no cervical adenopathy.  Neurological: She is alert and oriented to person, place, and time. She exhibits normal muscle tone.  Skin: Skin  is warm and dry. She is not diaphoretic.  Psychiatric: She has a normal mood and affect.    ED Course  Procedures (including critical care time)  Labs Reviewed  CBC - Abnormal; Notable for the following:    RBC 3.03 (*)    Hemoglobin 10.0 (*)    HCT 29.0 (*)    All other components within normal limits  DIFFERENTIAL - Abnormal; Notable for the following:    Lymphocytes Relative 11 (*)    Monocytes Relative 22 (*)    Monocytes Absolute 1.9 (*)    Eosinophils Relative 8 (*)    Basophils Relative 2 (*)    Basophils Absolute 0.2 (*)    All other components within normal limits  COMPREHENSIVE METABOLIC PANEL - Abnormal; Notable for the following:    Sodium 123 (*)    Chloride 89 (*)    Glucose, Bld 104 (*)    Total Protein 5.8 (*)    Albumin 2.9 (*)    GFR calc non Af Amer 82 (*)    All other components within normal limits  URINALYSIS, ROUTINE W REFLEX MICROSCOPIC - Abnormal; Notable for the following:    APPearance CLOUDY (*)    Hgb urine dipstick TRACE (*)    Protein, ur 30 (*)    Nitrite POSITIVE (*)    Leukocytes, UA LARGE (*)    All other components within normal limits  URINE MICROSCOPIC-ADD ON - Abnormal; Notable for the following:    Squamous Epithelial / LPF FEW (*)    Bacteria, UA MANY (*)    All other components within normal limits   No results found.   1. Hyponatremia   2. Urinary tract infection       MDM  78yo F with hx chronic UTIs presents with burning sensation to suprapubic area and increased burning with voiding. Followed by Dr. Margarita Grizzle. Exam generally unremarkable, VSS. Labs were significant for hyponatremia to 123 and a urinalysis with positive nitrites, large  leukocytes, large amount of white cells and bacteria seen on the microscopic. Given the significant hyponatremia, we will plan to get her admitted. Her PCP is Dr. Earl Gala with [facility]. Page placed to [facility] to arrange admission. I did discuss with husband who was agreeable with plan.  11:22 AM I talked with Dr. Isidoro Donning of [facility] who accepts the patient for admission. Pt to [facility] team 5.       Grant Fontana, [place] 03/28/12 1123

## 2012-03-28 NOTE — Progress Notes (Signed)
*  PRELIMINARY RESULTS* Vascular Ultrasound Lower extremity venous duplex has been completed.  Preliminary findings: Right= No evidence of DVT or baker's cyst. Evidence of superficial thrombus involving a PTV branch in the mid calf area. The thrombus does not extend into the PTV. Left= No evidence of DVT or SVT. Baker's cyst seen in medial pop fossa.  Farrel Demark, RDMS 03/28/2012, 12:41 PM

## 2012-03-28 NOTE — H&P (Signed)
History and Physical       Hospital Admission Note Date: 03/28/2012  Patient name: Audrey Obrien Medical record number: [id number] Date of birth: August 05, 1931 Age: 78 y.o. Gender: female PCP: Darnelle Bos, MD, MD  Attending physician: Cathren Harsh, MD   Chief Complaint:  Altered mental status with dysuria, lower abdominal pain for last 3 days  HPI: Patient is a 78 year old female with multiple medical problems including recurrent UTIs, hypertension, history of scleroderma, generalized weakness presented to [facility] ED from home with her husband. History was obtained from the patient's husband, as patient appears to be somewhat confused. Per patient's husband, she has history of recurrent UTIs, for last 3 days patient was complaining of burning dysuria, constant with lower abdominal pain with somewhat increased frequency of urination. He did not report any fevers or chills, nausea vomiting or back pain. He also states that patient is followed by Dr. Margarita Grizzle with [facility], had a urine culture done 3 weeks ago which was negative. Since then patient was taking an over-the-counter product to help with dysuria. Per patient's husband, she was recently admitted at [facility] for corneal transplant 3 months ago where she had a difficult hospitalization, when she had coded and was unresponsive and was placed on mechanical ventilation. She also had 3 seizures in the ICU and was followed by neurology, patient was started on phenytoin and primidone. Patient did not have any repeat seizures after that hospitalization, however per patient's husband, she has been more sleepy and lethargic since that hospitalization. She is wheelchair-bound, however they do not use the wheelchair at home and she needs assistance with all her ADLs. Patient's husband also reported, she's been having difficulty eating solids so he has been giving her Carnation liquid  breakfast. Patient was also noticed to have peripheral edema. In the ED, patient was noticed to have hyponatremia with sodium of 123 and UTI, hospitalist service was requested for admission.   Review of Systems:  Due to her altered mental status, patient unable to provide any review of system are soft  Past Medical History: Past Medical History  Diagnosis Date  . Hypertension   . Acne rosacea   . Chronic rhinitis     no benefit from antihistamines  . Gallbladder & bile duct stone 2007    1.6 cm GB stone; no symptoms  . Pneumonia 2007  . 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 C. difficile colitis 11/07  . Spastic dysphonia     botox injs at [facility]  . Hypertension   . Scleroderma     probable, ANA+ 1:160, anticentromers strongly pos, USRNP-, TH/TO-,RF equivocal, CCP-, Skin puffiness of hands, telangectasias of hands, raynauds, ECHO increased pulmonary pressures 8/11, pfts stable  . S/P right heart catheterization 10/11  . Benign essential tremor   . Osteoporosis     started alendronate 6/12   Past Surgical History  Procedure Date  . Lung surgery 1982  . Corneal transplant     Medications: Prior to Admission medications   Medication Sig Start Date End Date Taking? Authorizing Provider  aspirin 81 MG tablet Take 81 mg by mouth at bedtime.    Yes Historical Provider, MD  Cholecalciferol (VITAMIN D) 1000 UNITS capsule Take 1,000 Units by mouth daily.     Yes Historical Provider, MD  clarithromycin (BIAXIN) 500 MG tablet Take 500 mg by mouth 2 (two) times daily.     Yes Historical Provider, MD  esomeprazole (NEXIUM) 40 MG capsule Take 40 mg by mouth daily.     Yes Historical Provider, MD  loratadine (CLARITIN) 10 MG tablet Take 10 mg by mouth every evening.   Yes Historical Provider, MD  Methenamine-Sodium Salicylate (CYSTEX PO) Take 1 tablet by mouth 3 (three) times daily.   Yes Historical Provider, MD  METRONIDAZOLE, TOPICAL, (METROLOTION) 0.75 %  LOTN Apply 1 application topically 2 (two) times daily as needed. For rosecea   Yes Historical Provider, MD  phenytoin (DILANTIN) 50 MG tablet Chew 100 mg by mouth 3 (three) times daily.   Yes Historical Provider, MD  prednisoLONE acetate (PRED FORTE) 1 % ophthalmic suspension Place 1 drop into the left eye 4 (four) times daily.   Yes Historical Provider, MD  primidone (MYSOLINE) 50 MG tablet Take 50-100 mg by mouth 2 (two) times daily. 2 tabs in the morning and 1 tab at night   Yes Historical Provider, MD  propranolol (INDERAL LA) 80 MG 24 hr capsule Take 80 mg by mouth 2 (two) times daily.    Yes Historical Provider, MD    Allergies:  No Known Allergies  Social History:  reports that she has never smoked. She has never used smokeless tobacco. She reports that she does not drink alcohol or use illicit drugs.she currently lives at home with her husband, has a physical therapist who comes to the home, is dependent for all her ADLs.  Family History: Family History  Problem Relation Age of Onset  . Asthma Son     Physical Exam: Blood pressure 126/37, pulse 58, temperature 97.5 F (36.4 C), temperature source Oral, resp. rate 15, SpO2 96.00%. General: Lethargic, sleepy but easily arousable, no acute distress HEENT: Patient mostly keeps her right eye closed, dry mucosal membranes Neck: supple, no masses or lymphadenopathy, no goiter, no bruits  Heart: Regular rate and rhythm, without murmurs, rubs or gallops. Lungs: Clear to auscultation bilaterally, no wheezing, rales or rhonchi. Abdomen: Mild tenderness in the suprapubic region Soft, nondistended, positive bowel sounds, no masses. Extremities: No clubbing, cyanosis or edema with positive pedal pulses. Neuro: Due to lethargy, patient not cooperative with exam Skin: no rashes or lesions, warm and dry   LABS on Admission:  Basic Metabolic Panel:  Lab 03/28/12 1610  NA 123*  K 4.3  CL 89*  CO2 26  GLUCOSE 104*  BUN 15  CREATININE  0.65  CALCIUM 8.6  MG --  PHOS --   Liver Function Tests:  Lab 03/28/12 0845  AST 17  ALT 10  ALKPHOS 109  BILITOT 0.4  PROT 5.8*  ALBUMIN 2.9*    CBC:  Lab 03/28/12 0845  WBC 8.7  NEUTROABS 5.0  HGB 10.0*  HCT 29.0*  MCV 95.7  PLT 344     Radiological Exams on Admission: No results found.  Assessment/Plan Present on Admission:   Altered mental status : Likely secondary to UTI/dehydration/hyponatremia  - I will obtain CT head without contrast to rule out any other in acute intracranial pathology, treat the secondary causes   .UTI (lower urinary tract infection): With a history of recurrent UTIs - Obtain blood culture, urine culture, start on Rocephin, will follow culture to adjust antibiotics to sensitivities  - Follow urology outpatient, patient's urologist is Dr. Margarita Grizzle if inpatient consult is warranted   .Hyponatremia: Calculated serum osmolality 267, hypoosmolar hyponatremia, appears to be dehydrated, poor PO intake - Will obtain serum osmolarity, urine nodularity, urine lites, start IV fluids, recheck BMET later today    .  Dehydration: Continue IV fluids   .GERD (gastroesophageal reflux disease) - Continue PPI   .Bronchiectasis without acute exacerbation - Lungs clear, no acute intervention needed, on IV Rocephin   .HTN (hypertension) - Patient is on propranolol, will continue with parameters   .Seizure disorder: - I will continue primidone and phenytoin  .Scleroderma  .Dysphagia - Will place on a dysphagia 2 diet, for now, obtain swallow study for accurate consistencies  .Weakness generalized - Will obtain PT OT consult and evaluation once more alert and awake   Peripheral edema: Likely secondary to hypo-albuminemia, she also has a history of pulmonary hypertension due to scleroderma, possible to have right heart failure. However she is not in acute CHF. - Albumin is 2.9, obtain Doppler ultrasound of the lower extremities to rule out  DVT  DVT prophylaxis: Lovenox  CODE STATUS: I discussed in detail with the patient's husband at bedside, he wants her to be FULL CODE STATUS  Further plan will depend as patient's clinical course evolves and further radiologic and laboratory data become available.   @Time  Spent on Admission: 1 hour Dyson Sevey M.D. [facility] 03/28/2012, 12:16 PM

## 2012-03-28 NOTE — ED Notes (Signed)
Patient with complaints of lower abd pain for a couple of weeks.  Patient states she has pain when she voids.  She had urine study 3 weeks ago that was negative.

## 2012-03-28 NOTE — ED Notes (Signed)
Report given to Lori, RN

## 2012-03-29 DIAGNOSIS — I369 Nonrheumatic tricuspid valve disorder, unspecified: Secondary | ICD-10-CM

## 2012-03-29 LAB — COMPREHENSIVE METABOLIC PANEL
AST: 18 U/L (ref 0–37)
Albumin: 2.8 g/dL — ABNORMAL LOW (ref 3.5–5.2)
CO2: 26 mEq/L (ref 19–32)
Calcium: 8.3 mg/dL — ABNORMAL LOW (ref 8.4–10.5)
Creatinine, Ser: 0.71 mg/dL (ref 0.50–1.10)
GFR calc non Af Amer: 80 mL/min — ABNORMAL LOW (ref >90)
Total Protein: 5.6 g/dL — ABNORMAL LOW (ref 6.0–8.3)

## 2012-03-29 LAB — CBC
MCH: 33.4 pg (ref 26.0–34.0)
MCHC: 35.2 g/dL (ref 30.0–36.0)
MCV: 94.9 fL (ref 78.0–100.0)
Platelets: 315 10*3/uL (ref 150–400)
RDW: 14.9 % (ref 11.5–15.5)

## 2012-03-29 LAB — URINE CULTURE
Colony Count: 50000
Culture  Setup Time: 201304231556

## 2012-03-29 LAB — VITAMIN B12: Vitamin B-12: 447 pg/mL (ref 211–911)

## 2012-03-29 LAB — PHENOBARBITAL LEVEL: Phenobarbital: 7.8 ug/mL — ABNORMAL LOW (ref 15.0–40.0)

## 2012-03-29 LAB — PHENYTOIN LEVEL, TOTAL: Phenytoin Lvl: 32.3 ug/mL (ref 10.0–20.0)

## 2012-03-29 LAB — AMMONIA: Ammonia: 23 umol/L (ref 11–60)

## 2012-03-29 NOTE — Progress Notes (Addendum)
Subjective: Patient arousable but still groggy. Turns out that Dilantin level was quite high. May be contributing to most of her symptoms. She is being treated for UTI. She has blindness and actually a patch over her left eye from recent eye surgery. Feeling comfortable without other complaints  Objective: Weight change:   Intake/Output Summary (Last 24 hours) at 03/29/12 0804 Last data filed at 03/28/12 1700  Gross per 24 hour  Intake    340 ml  Output     50 ml  Net    290 ml   Filed Vitals:   03/28/12 1450 03/28/12 1705 03/28/12 2159 03/29/12 0508  BP: 162/60 153/71 112/63 161/68  Pulse: 62 65 62 65  Temp: 98.1 F (36.7 C)  97.7 F (36.5 C) 98 F (36.7 C)  TempSrc: Oral Oral Oral Oral  Resp: 18 18 18 18   Height: 5\' 2"  (1.575 m)     Weight: 50.3 kg (110 lb 14.3 oz)  50.8 kg (111 lb 15.9 oz)   SpO2: 96% 99% 98%    General: mildly lethargic, sleepy, but arousable, no acute distress  HEENT: Patient mostly keeps her right eye closed, dry mucosal membranes. Left eye patch in place  Neck: supple, no masses or lymphadenopathy, no goiter, no bruits  Heart: Regular rate and rhythm, without murmurs, rubs or gallops.  Lungs: Clear to auscultation bilaterally, no wheezing, rales or rhonchi.  Abdomen: No tenderness in the suprapubic region Soft, nondistended, positive bowel sounds, no masses.  Extremities: No clubbing, cyanosis or edema with positive pedal pulses. Mild tenderness to palpation in the right calf but no Homans Neuro: Due to lethargy, patient not cooperative with exam  Skin: no rashes or lesions, warm and dry   Lab Results:  [facility] 03/29/12 0540 03/28/12 1831  NA 124* 124*  K 4.3 4.5  CL 91* 90*  CO2 26 26  GLUCOSE 100* 128*  BUN 16 13  CREATININE 0.71 0.59  CALCIUM 8.3* 8.5  MG -- --  PHOS -- --    Basename 03/29/12 0540 03/28/12 0845  AST 18 17  ALT 9 10  ALKPHOS 101 109  BILITOT 0.3 0.4  PROT 5.6* 5.8*  ALBUMIN 2.8* 2.9*   No results found for this  basename: LIPASE:2,AMYLASE:2 in the last 72 hours  Basename 03/29/12 0540 03/28/12 1831 03/28/12 0845  WBC 7.4 6.3 --  NEUTROABS -- -- 5.0  HGB 9.9* 9.9* --  HCT 28.1* 28.4* --  MCV 94.9 95.9 --  PLT 315 363 --   No results found for this basename: CKTOTAL:3,CKMB:3,CKMBINDEX:3,TROPONINI:3 in the last 72 hours No components found with this basename: POCBNP:3 No results found for this basename: DDIMER:2 in the last 72 hours No results found for this basename: HGBA1C:2 in the last 72 hours No results found for this basename: CHOL:2,HDL:2,LDLCALC:2,TRIG:2,CHOLHDL:2,LDLDIRECT:2 in the last 72 hours  Basename 03/28/12 1831  TSH 2.839  T4TOTAL --  T3FREE --  THYROIDAB --   No results found for this basename: VITAMINB12:2,FOLATE:2,FERRITIN:2,TIBC:2,IRON:2,RETICCTPCT:2 in the last 72 hours  Studies/Results: Ct Head Wo Contrast  03/28/2012  *RADIOLOGY REPORT*  Clinical Data: Altered mental status.  Decreased level of consciousness.  Dementia.  CT HEAD WITHOUT CONTRAST  Technique:  Contiguous axial images were obtained from the base of the skull through the vertex without contrast.  Comparison: MRI brain 08/22/2009.  Findings: The study is mildly degraded by patient motion.  Some areas were repeated.  No acute cortical infarct, hemorrhage, or mass lesion is present.  Periventricular white matter  hypoattenuation is similar to the prior exam.  The ventricles are of normal size.  No significant extra-axial fluid collection is present.  IMPRESSION:  1.  Stable atrophy and mild white matter disease. 2.  No acute cortical infarct, hemorrhage, or mass lesion is present.  Original Report Authenticated By: Jamesetta Orleans. MATTERN, M.D.   Medications: Scheduled Meds:   . antiseptic oral rinse  15 mL Mouth Rinse BID  . aspirin EC  81 mg Oral QHS  . cefTRIAXone (ROCEPHIN)  IV  1 g Intravenous Q24H  . cholecalciferol  1,000 Units Oral Daily  . enoxaparin  40 mg Subcutaneous Q24H  . pantoprazole  80 mg  Oral Q1200  . prednisoLONE acetate  1 drop Left Eye QID  . primidone  100 mg Oral Daily  . primidone  50 mg Oral QHS  . propranolol ER  80 mg Oral BID  . sodium chloride  500 mL Intravenous Once  . sodium chloride  3 mL Intravenous Q12H  . white petrolatum      . DISCONTD: aspirin  81 mg Oral QHS  . DISCONTD: phenytoin  100 mg Oral TID  . DISCONTD: primidone  50-100 mg Oral BID  . DISCONTD: Vitamin D  1,000 Units Oral Daily   Continuous Infusions:   . sodium chloride 75 mL/hr (03/28/12 1522)   PRN Meds:.acetaminophen, acetaminophen, HYDROcodone-acetaminophen, HYDROmorphone, ondansetron (ZOFRAN) IV, ondansetron, DISCONTD: METRONIDAZOLE (TOPICAL)  Assessment/Plan:  Assessment/Plan  Present on Admission:  Altered mental status : Likely secondary to UTI/dehydration/hyponatremia  - I will obtain CT head without contrast to rule out any other in acute intracranial pathology, treat the secondary causes   .UTI (lower urinary tract infection): Followup on urinalysis - On IV Rocephin for now - urinalysis consistent with UTI. Culture pending  .Hyponatremia:  Continue IV hydration with normal saline  .Dehydration: Continue IV fluids   .GERD (gastroesophageal reflux disease)  - Continue PPI   .Bronchiectasis without acute exacerbation  - Lungs clear, no acute intervention needed   .HTN (hypertension)  - Patient is on propranolol, will continue with parameters   .Seizure disorder:  Dilantin has been discontinued and will resume primidone   .Scleroderma - aware, on PPI therapy  .Dysphagia  - continue dysphagia 2 diet, for now, obtain swallow study for accurate consistencies   .Weakness generalized - high Dilantin level likely contributing - Will obtain PT OT consult  Peripheral edema: Likely secondary to having a history of pulmonary hypertension due to scleroderma, possible to have right heart failure. However she is not in acute CHF.  - Albumin is 2.9, obtain   DVT  prophylaxis: Lovenox - she does have a superficial DVT in the right calf  CODE STATUS: FULL CODE STATUS   Further plan will depend as patient's clinical course evolves and further radiologic and laboratory data become available.     LOS: 1 day   Khaidyn Staebell NEVILL 03/29/2012, 8:04 AM

## 2012-03-29 NOTE — Plan of Care (Signed)
I was called by RN today AM for dilantin level critical value of 32.3. I have a placed HOLD on dilantin until dilantin level is therapeutic and less than 20. Recheck level in AM. Dr Earl Gala will assume care from today.    Audrey Obrien M.D. [facility] 03/29/2012, 7:31 AM  Pager: [phone]

## 2012-03-29 NOTE — Evaluation (Signed)
Clinical/Bedside Swallow Evaluation Patient Details  Name: Audrey Obrien MRN: [id number] DOB: 1931/11/20 Today's Date: 03/29/2012  HPI:78 yr old admitted with dysuria, AMS, abdominal pain, diagnosed with UTI.  History of esophagram 9/11 with mild esophageal dysphagia, mild GERD, minimal reflux esophagitis and severe degenerative disc disease C4-5, C6-7.  Pt. denies coughing with po's.  Husband reports frequent "bringing up phlegm" and weight loss, difficulty chewing/swallowing solids.  Past Medical History:  Past Medical History  Diagnosis Date  . Hypertension   . Acne rosacea   . Chronic rhinitis     no benefit from antihistamines  . Gallbladder & bile duct stone 2007    1.6 cm GB stone; no symptoms  . Pneumonia 2007  . 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 C. difficile colitis 11/07  . Spastic dysphonia     botox injs at [facility]  . Hypertension   . Scleroderma     probable, ANA+ 1:160, anticentromers strongly pos, USRNP-, TH/TO-,RF equivocal, CCP-, Skin puffiness of hands, telangectasias of hands, raynauds, ECHO increased pulmonary pressures 8/11, pfts stable  . S/P right heart catheterization 10/11  . Benign essential tremor   . Osteoporosis     started alendronate 6/12   Past Surgical History:  Past Surgical History  Procedure Date  . Lung surgery 1982  . Corneal transplant    Assessment/Recommendations/Treatment Plan Suspected Esophageal Findings Suspected Esophageal Findings:  (Pt. denies globus sensation)  SLP Assessment Clinical Impression Statement: Pt. exhibits mild oral dysphagia with mildly decreased manipulation.  Signs of penetration/aspiration include consistent, mild throat clearing with liquids and solids suspected due to possible cervical abnormalities (diagnosed on esophagram 9/11) and with documented esophageal dysphagia.  Recommend pt. continue with Dys 2 diet and thin liquids with pils whole in applesauce and MBS tomorrow to  fully assess oropharyngeal swallow function.      Risk for Aspiration: Moderate Other Related Risk Factors: History of GERD (cervical disc disease)  Swallow Evaluation Recommendations Recommended Consults: MBS Diet Recommendations: Dysphagia 2 (Fine chop);Thin liquid Liquid Administration via: Cup;Straw Medication Administration: Whole meds with puree Supervision: Full supervision/cueing for compensatory strategies Compensations: Slow rate;Small sips/bites Postural Changes and/or Swallow Maneuvers: Seated upright 90 degrees;Upright 30-60 min after meal Oral Care Recommendations: Oral care BID Follow up Recommendations:  (TBD)  Treatment Plan Treatment Plan Recommendations:  (will develop tx plan after MBS)  Prognosis Prognosis for Safe Diet Advancement: Fair  Individuals Consulted Consulted and Agree with Results and Recommendations: Patient;Family member/caregiver  Swallow Study   General  HPI: 78 yr old admitted with dysuria, AMS, abdominal pain, diagnosed with UTI.  History of esophagram 9/11 with mild esophageal dysphagia, mild GERD, minimal reflux esophagitis and severe degenerative disc disease C4-5, C6-7.  Pt. denies coughing with po's.  Husband reports frequent "bringing up phlegm" and weight loss, difficulty chewing/swallowing solids. Type of Study: Bedside swallow evaluation Diet Prior to this Study:  (Mostly liquids, purees/Dys 2, thin liquid) Respiratory Status: Room air Behavior/Cognition: Alert;Cooperative;Pleasant mood Oral Cavity - Dentition: Adequate natural dentition Vision: Functional for self-feeding Patient Positioning: Upright in bed Baseline Vocal Quality:  (Has spastic dysphonia) Volitional Cough: Strong Volitional Swallow: Able to elicit  Oral Motor/Sensory Function  Overall Oral Motor/Sensory Function: Appears within functional limits for tasks assessed  Consistency Results  Ice Chips Ice chips: Not tested  Thin Liquid Thin Liquid:  Impaired Presentation: Cup;Straw Pharyngeal  Phase Impairments: Throat Clearing - Delayed (Audible swallow)  Nectar Thick Liquid Nectar Thick Liquid:  Not tested  Honey Thick Liquid Honey Thick Liquid: Not tested  Puree Puree: Impaired Presentation: Spoon;Self Fed Pharyngeal Phase Impairments: Throat Clearing - Delayed (Audible swallow)  Solid Solid: Impaired Presentation: Self Fed Oral Phase Impairments: Reduced lingual movement/coordination (minimal)   Breck Coons Erhardt Dada M.Ed [employer] [phone]  03/29/2012

## 2012-03-29 NOTE — Progress Notes (Signed)
INITIAL ADULT NUTRITION ASSESSMENT Date: 03/29/2012   Time: 11:35 AM  Reason for Assessment: Nutrition Risk Report (Chewing/Swallowing Issues)  ASSESSMENT: Female 78 y.o.  Dx: Peripheral edema  Hx:  Past Medical History  Diagnosis Date  . Hypertension   . Acne rosacea   . Chronic rhinitis     no benefit from antihistamines  . Gallbladder & bile duct stone 2007    1.6 cm GB stone; no symptoms  . Pneumonia 2007  . 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 C. difficile colitis 11/07  . Spastic dysphonia     botox injs at [facility]  . Hypertension   . Scleroderma     probable, ANA+ 1:160, anticentromers strongly pos, USRNP-, TH/TO-,RF equivocal, CCP-, Skin puffiness of hands, telangectasias of hands, raynauds, ECHO increased pulmonary pressures 8/11, pfts stable  . S/P right heart catheterization 10/11  . Benign essential tremor   . Osteoporosis     started alendronate 6/12   Past Surgical History  Procedure Date  . Lung surgery 1982  . Corneal transplant    Related Meds:     . antiseptic oral rinse  15 mL Mouth Rinse BID  . aspirin EC  81 mg Oral QHS  . cefTRIAXone (ROCEPHIN)  IV  1 g Intravenous Q24H  . cholecalciferol  1,000 Units Oral Daily  . enoxaparin  40 mg Subcutaneous Q24H  . pantoprazole  80 mg Oral Q1200  . prednisoLONE acetate  1 drop Left Eye QID  . primidone  100 mg Oral Daily  . primidone  50 mg Oral QHS  . propranolol ER  80 mg Oral BID  . sodium chloride  500 mL Intravenous Once  . sodium chloride  3 mL Intravenous Q12H  . white petrolatum      . DISCONTD: aspirin  81 mg Oral QHS  . DISCONTD: phenytoin  100 mg Oral TID  . DISCONTD: primidone  50-100 mg Oral BID  . DISCONTD: Vitamin D  1,000 Units Oral Daily   Ht: 5\' 2"  (157.5 cm)  Wt: 111 lb 15.9 oz (50.8 kg)  Ideal Wt: 50 kg % Ideal Wt: 102%  Wt Readings from Last 10 Encounters:  03/28/12 111 lb 15.9 oz (50.8 kg)  11/22/11 112 lb (50.803 kg)  10/22/11 117 lb  12.8 oz (53.434 kg)  10/12/11 122 lb 12.8 oz (55.702 kg)  09/15/11 121 lb 12.8 oz (55.248 kg)  Usual Wt: 121 - 122 lb % Usual Wt: 92%  Body mass index is 20.48 kg/(m^2). Weight is WNL.  Food/Nutrition Related Hx: declining PO intake since last Summer 2/2 multiple hospital admission and acute medical issues  Labs:  CMP     Component Value Date/Time   NA 124* 03/29/2012 0540   K 4.3 03/29/2012 0540   CL 91* 03/29/2012 0540   CO2 26 03/29/2012 0540   GLUCOSE 100* 03/29/2012 0540   BUN 16 03/29/2012 0540   CREATININE 0.71 03/29/2012 0540   CALCIUM 8.3* 03/29/2012 0540   PROT 5.6* 03/29/2012 0540   ALBUMIN 2.8* 03/29/2012 0540   AST 18 03/29/2012 0540   ALT 9 03/29/2012 0540   ALKPHOS 101 03/29/2012 0540   BILITOT 0.3 03/29/2012 0540   GFRNONAA 80* 03/29/2012 0540   GFRAA >90 03/29/2012 0540    Intake/Output Summary (Last 24 hours) at 03/29/12 1138 Last data filed at 03/28/12 1700  Gross per 24 hour  Intake    340 ml  Output  50 ml  Net    290 ml   Diet Order: Dysphagia 2 with thin liquids  Supplements/Tube Feeding: none  IVF:    sodium chloride Last Rate: 75 mL/hr (03/28/12 1522)   Estimated Nutritional Needs:   Kcal: 1200 - 1400 kcal Protein:  58 - 68 grams Fluid:  1.5 - 1.8 L/d  Admitted with abdominal pain x 3 days. Also AMS.  Wheelchair bound at baseline. Husband reports pt has difficulty eating, and has been drinking [employer] in the mornings. Husband reports intake has been highly variable, ranging for 25-75% of usual intake since November. Pt with 8% wt loss since November (x 6 months.) Meets criteria for moderate malnutrition in the context of chronic illness 2/2 this wt loss, temporal wasting evident, and <75% of energy intake x at least 1 month.  NUTRITION DIAGNOSIS: -Inadequate oral intake (NI-2.1).  Status: Ongoing  RELATED TO: fluctuating appetite and swallowing issues  AS EVIDENCE BY: declining weight and decreased PO intake x at least 6  months.  MONITORING/EVALUATION(Goals): Goal: Pt to consume >/= 75% of meals. Monitor: weights, labs, PO intake, I/O's  EDUCATION NEEDS: -No education needs identified at this time  INTERVENTION: 1. [employer] Instant Breakfast PO BID 2. RD to continue to follow  Dietitian #: [phone]  DOCUMENTATION CODES Per approved criteria  -Non-severe (moderate) malnutrition in the context of chronic illness    Adair Laundry 03/29/2012, 11:35 AM

## 2012-03-29 NOTE — Progress Notes (Signed)
03-29-12 UR review completed. Levada Bowersox RN BSN  

## 2012-03-29 NOTE — Progress Notes (Signed)
*  PRELIMINARY RESULTS* Echocardiogram 2D Echocardiogram has been performed.  Katheren Puller 03/29/2012, 1:58 PM

## 2012-03-30 ENCOUNTER — Inpatient Hospital Stay (HOSPITAL_COMMUNITY): Payer: Medicare Other

## 2012-03-30 LAB — BASIC METABOLIC PANEL
BUN: 12 mg/dL (ref 6–23)
CO2: 25 mEq/L (ref 19–32)
Calcium: 8.1 mg/dL — ABNORMAL LOW (ref 8.4–10.5)
Chloride: 86 mEq/L — ABNORMAL LOW (ref 96–112)
Creatinine, Ser: 0.58 mg/dL (ref 0.50–1.10)
GFR calc Af Amer: >90 mL/min (ref >90)

## 2012-03-30 LAB — PHENYTOIN LEVEL, TOTAL: Phenytoin Lvl: 38.1 ug/mL (ref 10.0–20.0)

## 2012-03-30 MED ORDER — MAGIC MOUTHWASH
5.0000 mL | Freq: Three times a day (TID) | ORAL | Status: DC
  Administered 2012-03-30 – 2012-04-04 (×22): 5 mL via ORAL
  Filled 2012-03-30 (×28): qty 5

## 2012-03-30 MED ORDER — ZINC OXIDE 11.3 % EX CREA
TOPICAL_CREAM | Freq: Two times a day (BID) | CUTANEOUS | Status: DC
  Administered 2012-03-30 – 2012-04-04 (×11): via TOPICAL
  Filled 2012-03-30 (×3): qty 56

## 2012-03-30 NOTE — Progress Notes (Signed)
Speech Language Pathology Treatment  Patient Details Name: Audrey Obrien MRN: [id number] DOB: 1931/10/07 Today's Date: 03/30/2012  Full report to be documented RECOMMEND:  Pt. Continue with Dys 2 diet and thin liquids, pills whole in applesauce  General Respiratory Status: Room air Behavior/Cognition: Alert;Cooperative;Pleasant mood Oral Cavity - Dentition: Adequate natural dentition Patient Positioning: Upright in chair         Royce Macadamia 03/30/2012, 9:45 AM

## 2012-03-30 NOTE — Evaluation (Addendum)
Objective Swallowing Evaluation: Modified Barium Swallowing Study  Patient Details  Name: Audrey Obrien MRN: [id number] Date of Birth: 02-18-1931  Today's Date: 03/30/2012 Time:  -    HPI:  78 yr old admitted with dysuria, AMS, abdominal pain, diagnosed with UTI.  History of esophagram 9/11 with mild esophageal dysphagia, mild GERD, minimal reflux esophagitis and severe degenerative disc disease C4-5, C6-7.  Pt. denies coughing with po's.  Husband reports frequent "bringing up phlegm" and weight loss, difficulty chewing/swallowing solids.  Bedside swallow revealed consistent mild throat clearing and given history and current presentation, MBS was recommended.   Past Medical History:  Past Medical History  Diagnosis Date  . Hypertension   . Acne rosacea   . Chronic rhinitis     no benefit from antihistamines  . Gallbladder & bile duct stone 2007    1.6 cm GB stone; no symptoms  . Pneumonia 2007  . 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 C. difficile colitis 11/07  . Spastic dysphonia     [facility]otox injs at [facility]  . Hypertension   . Scleroderma     probable, ANA+ 1:160, anticentromers strongly pos, USRNP-, TH/TO-,RF equivocal, CCP-, Skin puffiness of hands, telangectasias of hands, raynauds, ECHO increased pulmonary pressures 8/11, pfts stable  . S/P right heart catheterization 10/11  . Benign essential tremor   . Osteoporosis     started alendronate 6/12   Past Surgical History:  Past Surgical History  Procedure Date  . Lung surgery 1982  . Corneal transplant    Recommendation/Prognosis  Clinical Impression Dysphagia Diagnosis: Moderate oral phase dysphagia;Mild pharyngeal phase dysphagia Clinical impression: Pt. exhibited moderate motor based oral dysphagia and mild motor pharyngeal dysphagia..  Oral dysphagia includes delayed bolus formation and transit, however, pt.able to keep bolus intact prior to transit.  Pharyngeal deficits include reduced tongue  base retraction and laryngeal elevation resulting in min-mild vallecular and pyriform sinus residue.  Brief esophageal scan revealed what appeared to be possibly mild stasis near the proximal esophagus.  SLP recommending pt. continue on Dys 2 diet and thin liquids with esophageal precautions.     Swallow Evaluation Recommendations Diet Recommendations: Dysphagia 2 (Fine chop);Thin liquid Liquid Administration via: Cup;Straw Medication Administration: Whole meds with puree Supervision: Full supervision/cueing for compensatory strategies Compensations: Slow rate;Small sips/bites;Follow solids with liquid;Multiple dry swallows after each bite/sip Postural Changes and/or Swallow Maneuvers: Seated upright 90 degrees;Upright 30-60 min after meal Oral Care Recommendations: Oral care BID Follow up Recommendations:  (TBD) Prognosis Prognosis for Safe Diet Advancement:  (fair-good) Individuals Consulted Consulted and Agree with Results and Recommendations: Patient  SLP Assessment/Plan SLP Goals  SLP Swallowing Goals Patient will consume recommended diet without observed clinical signs of aspiration with: Minimal cueing Patient will utilize recommended strategies during swallow to increase swallowing safety with: Minimal cueing  General:  HPI: 77 yr old admitted with dysuria, AMS, abdominal pain, diagnosed with UTI.  History of esophagram 9/11 with mild esophageal dysphagia, mild GERD, minimal reflux esophagitis and severe degenerative disc disease C4-5, C6-7.  Pt. denies coughing with po's.  Husband reports frequent "bringing up phlegm" and weight loss, difficulty chewing/swallowing solids.  Bedside swallow revealed consistent mild throat clearing and given history and current presentation, MBS was recommended.  Type of Study: Modified Barium Swallowing Study Diet Prior to this Study: Dysphagia 2 (chopped);Thin liquids Respiratory Status: Room air Behavior/Cognition: Alert;Cooperative;Pleasant  mood Oral Cavity - Dentition: Adequate natural dentition Oral Motor / Sensory Function:  Impaired - see Bedside swallow eval Vision: Functional for self-feeding Patient Positioning: Upright in chair Baseline Vocal Quality:  (spastic dysphonia) Volitional Cough: Strong Volitional Swallow: Able to elicit Anatomy: Within functional limits (appeared to have small bony cervical growth) Pharyngeal Secretions: Not observed secondary MBS  Oral Phase Oral Preparation/Oral Phase Oral Phase: Impaired Oral - Honey Oral - Honey Teaspoon: Holding of bolus;Weak lingual manipulation;Reduced posterior propulsion;Delayed oral transit Oral - Nectar Oral - Nectar Teaspoon: Weak lingual manipulation;Reduced posterior propulsion;Holding of bolus;Delayed oral transit Oral - Nectar Cup: Weak lingual manipulation;Holding of bolus;Delayed oral transit;Reduced posterior propulsion Oral - Thin Oral - Thin Teaspoon: Holding of bolus;Delayed oral transit Oral - Thin Cup: Delayed oral transit;Holding of bolus Oral - Thin Straw: Delayed oral transit;Holding of bolus Oral - Solids Oral - Regular: Impaired mastication;Weak lingual manipulation Pharyngeal Phase  Pharyngeal Phase Pharyngeal Phase: Impaired Pharyngeal - Honey Pharyngeal - Honey Teaspoon: Reduced laryngeal elevation;Reduced tongue base retraction;Pharyngeal residue - valleculae;Pharyngeal residue - pyriform Pharyngeal - Nectar Pharyngeal - Nectar Teaspoon: Pharyngeal residue - valleculae;Pharyngeal residue - pyriform;Reduced laryngeal elevation;Reduced tongue base retraction Pharyngeal - Nectar Cup: Reduced laryngeal elevation;Reduced tongue base retraction;Pharyngeal residue - valleculae;Pharyngeal residue - pyriform Pharyngeal - Thin Pharyngeal - Thin Teaspoon: Pharyngeal residue - valleculae;Pharyngeal residue - pyriform;Reduced laryngeal elevation;Reduced tongue base retraction Pharyngeal - Thin Cup: Reduced laryngeal elevation;Reduced tongue base  retraction;Pharyngeal residue - valleculae;Pharyngeal residue - pyriform Pharyngeal - Thin Straw: Reduced laryngeal elevation;Reduced tongue base retraction;Pharyngeal residue - valleculae;Pharyngeal residue - pyriform Pharyngeal - Solids Pharyngeal - Regular: Pharyngeal residue - valleculae;Reduced laryngeal elevation Cervical Esophageal Phase  Cervical Esophageal Phase Cervical Esophageal Phase: Impaired Cervical Esophageal Phase - Comment Cervical Esophageal Comment:  (decreased UES relaxation)    Darrow Bussing.Ed [employer] [phone]  03/30/2012

## 2012-03-30 NOTE — Progress Notes (Signed)
CRITICAL VALUE ALERT  Critical value received:  Dilantin level=38.1  Date of notification:  4/25  Time of notification: 0720  Critical value read back:yes  Nurse who received alert:  Minna Merritts  MD notified (1st page):  Dr Earl Gala

## 2012-03-30 NOTE — Evaluation (Signed)
Physical Therapy Evaluation Patient Details Name: Audrey Obrien MRN: [id number] DOB: 05-02-1931 Today's Date: 03/30/2012 Time: 7829-5621 PT Time Calculation (min): 33 min  PT Assessment / Plan / Recommendation Clinical Impression  Pt. 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  She is functioning at a decreased mobility level per husband but he desires to be able to take her back home and continue to provide 24 hour care for her.  She will need to be a little more mobile before she can safely DC home even with 24 hour help form husband. Hopefully she will clear and progress her mobility.  Otherwise, may need to look at CIR vs. ST SNF if husband can't meet her needs.    PT Assessment  Patient needs continued PT services    Follow Up Recommendations  Home health PT;Supervision/Assistance - 24 hour    Equipment Recommendations  None recommended by PT    Frequency Min 3X/week    Precautions / Restrictions Precautions Precautions: Fall Restrictions Weight Bearing Restrictions: No         Mobility  Bed Mobility Bed Mobility: Not assessed (pt. up in recliner and remained there post eval, pt. request) Transfers Transfers: Sit to Stand;Stand to Sit Sit to Stand: 3: Mod assist;From chair/3-in-1;Other (comment) (with shelf arm support) Stand to Sit: 3: Mod assist;To chair/3-in-1;Other (comment) (with shelf arm support; pt. unable to reach for armrests) Details for Transfer Assistance: Pt. needed safety and technique cues and lateral support for balance Ambulation/Gait Ambulation/Gait Assistance: Not tested (comment);Other (comment) (pt. not ready for ambulation)    Exercises     PT Goals Acute Rehab PT Goals PT Goal Formulation: With patient Time For Goal Achievement: 04/13/12 Potential to Achieve Goals: Fair Pt will go Supine/Side to Sit: with min  assist PT Goal: Supine/Side to Sit - Progress: Goal set today Pt will Sit at Edge of Bed: with supervision;3-5 min;with bilateral upper extremity support PT Goal: Sit at Edge Of Bed - Progress: Goal set today Pt will go Sit to Supine/Side: with min assist PT Goal: Sit to Supine/Side - Progress: Goal set today Pt will go Sit to Stand: with min assist PT Goal: Sit to Stand - Progress: Goal set today Pt will go Stand to Sit: with min assist PT Goal: Stand to Sit - Progress: Goal set today Pt will Transfer Bed to Chair/Chair to Bed: with min assist PT Transfer Goal: Bed to Chair/Chair to Bed - Progress: Goal set today Pt will Ambulate: 51 - 150 feet;with min assist;with least restrictive assistive device PT Goal: Ambulate - Progress: Goal set today Pt will Go Up / Down Stairs: 1-2 stairs;with mod assist;with least restrictive assistive device PT Goal: Up/Down Stairs - Progress: Goal set today Pt will Perform Home Exercise Program: with min assist PT Goal: Perform Home Exercise Program - Progress: Goal set today  Visit Information  Last PT Received On: 03/30/12 Assistance Needed: +2    Subjective Data  Subjective: [name] plans to take pt. back home at DC Patient Stated Goal: pt. did not state   Prior Functioning  Home Living Lives With: Spouse Available Help at Discharge: Available 24 hours/day;Family Type of Home: House Home Access: Stairs to enter [employer] of Steps: 2 Entrance Stairs-Rails: None Home Layout: Two level;Full bath on main level;Able to live on main level with bedroom/bathroom Bathroom Shower/Tub: Walk-in shower;Door [employer] Toilet: Handicapped height Bathroom  Accessibility: Yes How Accessible: Accessible via wheelchair Home Adaptive Equipment: Wheelchair - manual;Walker - rolling;Shower chair without back;Bedside commode/3-in-1 Prior Function Level of Independence: Needs assistance Needs Assistance:  Bathing;Dressing;Feeding;Grooming;Gait;Toileting;Transfers Gait Assistance: assists from back with safety belt Able to Take Stairs?: Yes Driving: No Vocation: Retired Musician: No difficulties Dominant Hand: Right    Cognition  Overall Cognitive Status: Difficult to assess Difficult to assess due to: Level of arousal Arousal/Alertness: Lethargic Orientation Level: Appears intact for tasks assessed Behavior During Session: Lethargic    Extremity/Trunk Assessment Right Upper Extremity Assessment RUE ROM/Strength/Tone:  (not tested; deferred to OT) Left Upper Extremity Assessment LUE ROM/Strength/Tone:  (not tested; deferred to OT) Right Lower Extremity Assessment RLE ROM/Strength/Tone: Deficits;Unable to fully assess (due to decreased level of arousal) RLE ROM/Strength/Tone Deficits: grossly at least 3/5; unable to MMT  RLE Sensation: WFL - Light Touch Left Lower Extremity Assessment LLE ROM/Strength/Tone: Deficits;Unable to fully assess (due to decreased levle of arousal) LLE ROM/Strength/Tone Deficits: grossly at least 3/5 LLE Sensation: [facility] - Light Touch   Balance Balance Balance Assessed: Yes Static Sitting Balance Static Sitting - Balance Support: Bilateral upper extremity supported;Feet supported Static Sitting - Level of Assistance: 4: Min assist  End of Session PT - End of Session Equipment Utilized During Treatment: Gait belt Activity Tolerance: Patient limited by fatigue;Other (comment) (limited by decreased arousal) Patient left: in chair;with call bell/phone within reach;with family/visitor present Nurse Communication: Mobility status   Ferman Hamming 03/30/2012, 3:43 PM Acute Rehabilitation Services [phone] (pager)

## 2012-03-30 NOTE — Plan of Care (Signed)
Problem: Phase I Progression Outcomes Goal: Voiding-avoid urinary catheter unless indicated Outcome: Not Progressing Foley catheter placed per MD order for I and O

## 2012-03-30 NOTE — Progress Notes (Addendum)
Subjective: Audrey Obrien is much more alert today. She is complaining of inability to urinate. She does have some mild soreness to the vulvar perineal area. No abdominal pain or chest pain. Mouth is very uncomfortable, extremely dry. Dilantin level actually higher this morning though she is more clear mentally. She is being treated for a possible UTI as well. Dilantin was discontinued yesterday  Objective: Weight change: 2.907 kg (6 lb 6.5 oz)  Intake/Output Summary (Last 24 hours) at 03/30/12 0740 Last data filed at 03/29/12 1700  Gross per 24 hour  Intake    360 ml  Output      0 ml  Net    360 ml   Filed Vitals:   03/29/12 1400 03/29/12 1800 03/29/12 2026 03/30/12 0516  BP: 128/68 136/69 182/81 132/81  Pulse: 74 69 77 79  Temp: 98.4 F (36.9 C) 98 F (36.7 C) 98.3 F (36.8 C) 98 F (36.7 C)  TempSrc: Oral Oral Oral Oral  Resp: 18 18 20 18   Height:   5\' 2"  (1.575 m)   Weight:   53.207 kg (117 lb 4.8 oz)   SpO2: 96% 98% 93% 90%   General: Awake and conversive, mildly confused. Complaining of dry mouth and inability to urinate HEENT: Eyes are open today, dry mucosal membranes. Left eye patch removed  Neck: supple, no masses or lymphadenopathy, no goiter, no bruits  Heart: Regular rate and rhythm, without murmurs, rubs or gallops.  Lungs: Clear to auscultation bilaterally, no wheezing, rales or rhonchi.  Abdomen: No tenderness in the suprapubic region Soft, nondistended, positive bowel sounds, no masses. Vulvar area slightly red but no excoriations noted  Extremities: No clubbing, cyanosis or edema with positive pedal pulses. Mild tenderness to palpation in the right calf but no Homans. Trace peripheral edema in the ankles and feet, improved Neuro: Due to lethargy, patient not cooperative with exam  Skin: no rashes or lesions, warm and dry  Lab Results:  [facility] 03/30/12 0515 03/29/12 0540  NA 120* 124*  K 3.6 4.3  CL 86* 91*  CO2 25 26  GLUCOSE 124* 100*  BUN 12 16    CREATININE 0.58 0.71  CALCIUM 8.1* 8.3*  MG -- --  PHOS -- --    Basename 03/29/12 0540 03/28/12 0845  AST 18 17  ALT 9 10  ALKPHOS 101 109  BILITOT 0.3 0.4  PROT 5.6* 5.8*  ALBUMIN 2.8* 2.9*   No results found for this basename: LIPASE:2,AMYLASE:2 in the last 72 hours  Basename 03/29/12 0540 03/28/12 1831 03/28/12 0845  WBC 7.4 6.3 --  NEUTROABS -- -- 5.0  HGB 9.9* 9.9* --  HCT 28.1* 28.4* --  MCV 94.9 95.9 --  PLT 315 363 --   No results found for this basename: CKTOTAL:3,CKMB:3,CKMBINDEX:3,TROPONINI:3 in the last 72 hours No components found with this basename: POCBNP:3 No results found for this basename: DDIMER:2 in the last 72 hours No results found for this basename: HGBA1C:2 in the last 72 hours No results found for this basename: CHOL:2,HDL:2,LDLCALC:2,TRIG:2,CHOLHDL:2,LDLDIRECT:2 in the last 72 hours  Basename 03/29/12 0540  TSH 3.330  T4TOTAL --  T3FREE --  THYROIDAB --    Basename 03/29/12 0540  VITAMINB12 447  FOLATE --  FERRITIN --  TIBC --  IRON --  RETICCTPCT --    Studies/Results: Ct Head Wo Contrast  03/28/2012  *RADIOLOGY REPORT*  Clinical Data: Altered mental status.  Decreased level of consciousness.  Dementia.  CT HEAD WITHOUT CONTRAST  Technique:  Contiguous axial  images were obtained from the base of the skull through the vertex without contrast.  Comparison: MRI brain 08/22/2009.  Findings: The study is mildly degraded by patient motion.  Some areas were repeated.  No acute cortical infarct, hemorrhage, or mass lesion is present.  Periventricular white matter hypoattenuation is similar to the prior exam.  The ventricles are of normal size.  No significant extra-axial fluid collection is present.  IMPRESSION:  1.  Stable atrophy and mild white matter disease. 2.  No acute cortical infarct, hemorrhage, or mass lesion is present.  Original Report Authenticated By: Jamesetta Orleans. MATTERN, M.D.   Medications: Scheduled Meds:   . antiseptic  oral rinse  15 mL Mouth Rinse BID  . aspirin EC  81 mg Oral QHS  . cefTRIAXone (ROCEPHIN)  IV  1 g Intravenous Q24H  . cholecalciferol  1,000 Units Oral Daily  . enoxaparin  40 mg Subcutaneous Q24H  . pantoprazole  80 mg Oral Q1200  . prednisoLONE acetate  1 drop Left Eye QID  . primidone  100 mg Oral Daily  . primidone  50 mg Oral QHS  . propranolol ER  80 mg Oral BID  . sodium chloride  3 mL Intravenous Q12H   Continuous Infusions:   . sodium chloride 75 mL/hr at 03/29/12 1204   PRN Meds:.acetaminophen, acetaminophen, HYDROcodone-acetaminophen, HYDROmorphone, ondansetron (ZOFRAN) IV, ondansetron  Assessment/Plan:  Altered mental status :  Mental status improving but Dilantin level still very high this morning. Also sodium level is low. I suspect possible SIADH from Dilantin toxicity. - Head CT nonacute. Stable atrophy and white matter disease. Urine culture with mixed morphotypes at 50,000 colonies, may not be contributing much to mental status change  .UTI (lower urinary tract infection): Discontinue Rocephin, likely no significant UTI  .Hyponatremia: Continue IV hydration with normal saline for one more day. If sodium level drops or does not improve, will fluid restrict him however her mouth is parched think that she is still dehydrated and BUN is not that high because she hasn't been eating  .Dehydration: Continue IV fluids at 100 cc an hour of normal saline  .GERD (gastroesophageal reflux disease) - Continue PPI   .Bronchiectasis without acute exacerbation  - Lungs clear, no acute intervention needed   .HTN (hypertension)  - Patient is on propranolol, will continue with parameters   .Seizure disorder: Dilantin has been discontinued and will resume primidone   .Scleroderma - aware, on PPI therapy   .Dysphagia  - continue dysphagia 2 diet, thin liquids after speech evaluation performed today  .Weakness generalized - high Dilantin level likely contributing  - Will  obtain PT OT consult   Peripheral edema: Likely secondary to having a history of pulmonary hypertension due to scleroderma, possible to have right heart failure. However she is not in acute CHF. Albumin is 2.9. Improving with leg elevation.   DVT prophylaxis: Lovenox - she does have a superficial DVT in the right calf, asymptomatic today  Urinary retention: Place Foley catheter today and monitor I&Os  Vulvar and perineal erythema:  Possibly from simple irritation from urine. Continue Balmex cream   CODE STATUS: FULL CODE STATUS         LOS: 2 days   Kortez Murtagh NEVILL 03/30/2012, 7:40 AM

## 2012-03-31 DIAGNOSIS — T420X1A Poisoning by hydantoin derivatives, accidental (unintentional), initial encounter: Secondary | ICD-10-CM | POA: Diagnosis present

## 2012-03-31 LAB — CBC
HCT: 25.9 % — ABNORMAL LOW (ref 36.0–46.0)
Hemoglobin: 9.3 g/dL — ABNORMAL LOW (ref 12.0–15.0)
MCH: 33.7 pg (ref 26.0–34.0)
MCHC: 35.9 g/dL (ref 30.0–36.0)
RDW: 15.2 % (ref 11.5–15.5)

## 2012-03-31 LAB — DIFFERENTIAL
Basophils Absolute: 0.1 10*3/uL (ref 0.0–0.1)
Basophils Relative: 2 % — ABNORMAL HIGH (ref 0–1)
Eosinophils Absolute: 0.3 10*3/uL (ref 0.0–0.7)
Monocytes Absolute: 2.3 10*3/uL — ABNORMAL HIGH (ref 0.1–1.0)
Monocytes Relative: 30 % — ABNORMAL HIGH (ref 3–12)

## 2012-03-31 LAB — BASIC METABOLIC PANEL
BUN: 15 mg/dL (ref 6–23)
Calcium: 8 mg/dL — ABNORMAL LOW (ref 8.4–10.5)
Creatinine, Ser: 0.72 mg/dL (ref 0.50–1.10)
GFR calc Af Amer: >90 mL/min (ref >90)
GFR calc non Af Amer: 80 mL/min — ABNORMAL LOW (ref >90)

## 2012-03-31 LAB — PHENYTOIN LEVEL, TOTAL: Phenytoin Lvl: 23.7 ug/mL — ABNORMAL HIGH (ref 10.0–20.0)

## 2012-03-31 NOTE — Progress Notes (Signed)
Speech Language Pathology Dysphagia Treatment  Patient Details Name: Audrey Obrien MRN: [id number] DOB: [birth year] Today's Date: 03/31/2012  SLP Assessment/Plan/Recommendation Assessment / Recommendations / Plan Clinical Impression Statement: Reviewed results of yesterday's MBS with pt. and spouse as well as provided education and clinical reasoning for recommended precautions during meal.  Pt. required min-mild reminders for small sips when using a straw.  Adequate airway protection exhibited.  Will continue to follow for safety with diet and ability to upgrade texture if able.  Continue with Current Diet: Dysphagia 2 (fine chop);Thin liquid Liquids provided via: Cup;Straw Medication Administration: Whole meds with puree Supervision: Full supervision/cueing for compensatory strategies Compensations: Slow rate;Small sips/bites;Follow solids with liquid Postural Changes and/or Swallow Maneuvers: Seated upright 90 degrees;Upright 30-60 min after meal Oral Care Recommendations: Oral care BID Plan: Continue with current plan of care Swallowing Goals  SLP Swallowing Goals Patient will consume recommended diet without observed clinical signs of aspiration with: Minimal cueing Swallow Study Goal #1 - Progress: Partly Met Patient will utilize recommended strategies during swallow to increase swallowing safety with: Minimal cueing Swallow Study Goal #2 - Progress: Partly met  General Temperature Spikes Noted: No Respiratory Status: Room air Behavior/Cognition: Alert;Cooperative;Pleasant mood Oral Cavity - Dentition: Adequate natural dentition Patient Positioning: Upright in bed  Oral Cavity - Oral Hygiene Does patient have any of the following "at risk" factors?: Other - dysphagia Brush patient's teeth BID with toothbrush (using toothpaste with fluoride): Yes Patient is AT RISK - Oral Care Protocol followed (see row info): Yes   Dysphagia Treatment Treatment focused on: Skilled observation  of diet tolerance;Patient/family/caregiver education;Facilitation of oral preparatory phase;Utilization of compensatory strategies;Facilitation of oral phase;Facilitation of pharyngeal phase Treatment Methods/Modalities: Skilled observation Patient observed directly with PO's: Yes Type of PO's observed: Thin liquids Feeding: Able to feed self Liquids provided via: Straw Type of cueing: Verbal Amount of cueing: Minimal   Royce Macadamia M.Ed CCC-SLP Pager [phone]  03/31/2012  Royce Macadamia 03/31/2012, 3:09 PM

## 2012-03-31 NOTE — Progress Notes (Signed)
Subjective: Pt awake  oriented to place  Denies pain No SOB No seizure overnight Lab pending   Objective: Vital signs in last 24 hours: Temp:  [97.8 F (36.6 C)-98.3 F (36.8 C)] 97.9 F (36.6 C) (04/26 0452) Pulse Rate:  [67-75] 73  (04/26 0452) Resp:  [16-18] 16  (04/26 0452) BP: (96-132)/(57-74) 114/66 mmHg (04/26 0452) SpO2:  [90 %-99 %] 94 % (04/26 0452) Weight change:  Last BM Date: 03/29/12  Intake/Output from previous day: 04/25 0701 - 04/26 0700 In: 2546.7 [P.O.:240; I.V.:2306.7] Out: 775 [Urine:775] Intake/Output this shift:    General appearance: alert and no distress Resp: clear to auscultation bilaterally Cardio: regular rate and rhythm GI: soft, non-tender; bowel sounds normal; no masses,  no organomegaly Neurologic: Mental status: oreinted to place.  Lab Results:  Basename 03/31/12 0545 03/29/12 0540  WBC 7.8 7.4  HGB 9.3* 9.9*  HCT 25.9* 28.1*  PLT 277 315   BMET  Basename 03/30/12 0515 03/29/12 0540  NA 120* 124*  K 3.6 4.3  CL 86* 91*  CO2 25 26  GLUCOSE 124* 100*  BUN 12 16  CREATININE 0.58 0.71  CALCIUM 8.1* 8.3*    Studies/Results: Dg Swallowing Func-no Report  03/30/2012  CLINICAL DATA: dysphagia   FLUOROSCOPY FOR SWALLOWING FUNCTION STUDY:  Fluoroscopy was provided for swallowing function study, which was  administered by a speech pathologist.  Final results and recommendations  from this study are contained within the speech pathology report.      Medications: I have reviewed the patient's current medications.  Assessment/Plan: Altered mental status : Mental status improving, Dilantin, 23.7  Down, improving. Also sodium level is low. I suspect possible SIADH from Dilantin toxicity. - Head CT nonacute. Stable atrophy and white matter disease. No UTI  .Hyponatremia: Continue IV hydration with normal saline Na 122-  Up from 120 Continue NS at 75 hr .Dehydration: Continue IV fluids at 100 cc an hour of normal saline   .GERD  (gastroesophageal reflux disease)  - Continue PPI   .Bronchiectasis without acute exacerbation  - Lungs clear, no acute intervention needed   .HTN (hypertension)  - Patient is on propranolol, will continue with parameters   .Seizure disorder: Dilantin has been discontinued and will resume primidone   .Scleroderma - aware, on PPI therapy   .Dysphagia  - continue dysphagia 2 diet, thin liquids, swallow eval noted  .Weakness generalized - high Dilantin level likely contributing  - PT eval noted- encourage PT-    Peripheral edema:less today..  DVT prophylaxis: Lovenox - she does have a superficial DVT in the right calf, asymptomatic today  Urinary retention: Place Foley catheter today and monitor I&Os  Vulvar and perineal erythema: Possibly from simple irritation from urine. Continue Balmex cream  CODE STATUS: FULL CODE STATUS       LOS: 3 days   Ariday Brinker 03/31/2012, 7:34 AM

## 2012-03-31 NOTE — Progress Notes (Signed)
Physical Therapy Treatment Patient Details Name: Audrey Obrien MRN: [id number] DOB: 1931-09-15 Today's Date: 03/31/2012 Time: 4098-1191 PT Time Calculation (min): 27 min  PT Assessment / Plan / Recommendation Comments on Treatment Session  Pt. found in room in bed trying to feed self.  UE tremor was making this difficult for her combined with visual impairment.  Assisted pt. in finishing breakfast.  Pt. much more alert today but balance remains a significant limiting factor for her.  Needs ongoing PT on acute and will monitor DC situation for most appropriate venue, but at this time, husband plans to care for her in the home.    Follow Up Recommendations  Home health PT;Supervision/Assistance - 24 hour    Equipment Recommendations  None recommended by PT    Frequency Min 3X/week   Plan Discharge plan remains appropriate    Precautions / Restrictions Precautions Precautions: Fall Restrictions Other Position/Activity Restrictions: eye patch to Left eye when pt. supine or bending or lifting       Mobility  Bed Mobility Bed Mobility: Rolling Right;Right Sidelying to Sit Rolling Right: 3: Mod assist Right Sidelying to Sit: 1: +2 Total assist;HOB elevated;With rails Right Sidelying to Sit: Patient Percentage: 40% Details for Bed Mobility Assistance: vc's for appropriate hand placement Transfers Transfers: Sit to Stand;Stand to Sit Sit to Stand: 1: +2 Total assist;From bed;With upper extremity assist Sit to Stand: Patient Percentage: 40% Stand to Sit: 1: +2 Total assist;With upper extremity assist;To chair/3-in-1 Stand to Sit: Patient Percentage: 40% Details for Transfer Assistance: cues for safe technique; pt. tends to grab hold of bed rail Ambulation/Gait Ambulation/Gait Assistance: 1: +2 Total assist Ambulation/Gait: Patient Percentage: 40 Ambulation Distance (Feet): 2 Feet Assistive device: Rolling walker Ambulation/Gait Assistance Details: Pt. with heavy lean toward right in  sitting and standing requiring assist to correct. Gait Pattern: Step-to pattern;Lateral trunk lean to right    Exercises     PT Goals Acute Rehab PT Goals PT Goal: Supine/Side to Sit - Progress: Progressing toward goal PT Goal: Sit at Edge Of Bed - Progress: Progressing toward goal PT Goal: Sit to Stand - Progress: Progressing toward goal PT Goal: Stand to Sit - Progress: Progressing toward goal PT Transfer Goal: Bed to Chair/Chair to Bed - Progress: Progressing toward goal PT Goal: Ambulate - Progress: Progressing toward goal  Visit Information  Last PT Received On: 03/31/12 Assistance Needed: +2    Subjective Data  Subjective: [name] advised PT that pt. is to wear eye patch if she will be bending over or lifting as well as when when she is supine in bed (left eye).  This therapist reiterated this to pt's RN Desma Paganini.        Cognition  Overall Cognitive Status: Appears within functional limits for tasks assessed/performed Arousal/Alertness: Awake/alert Orientation Level: Appears intact for tasks assessed Behavior During Session: [facility] for tasks performed    Balance  Static Sitting Balance Static Sitting - Balance Support: Bilateral upper extremity supported;Feet supported (assist for proper hand placement) Static Sitting - Level of Assistance: 4: Min assist;3: Mod assist;Other (comment) (fluctuating and lean to right)  End of Session PT - End of Session Equipment Utilized During Treatment: Gait belt Activity Tolerance: Patient limited by fatigue Patient left: in chair;with call bell/phone within reach Nurse Communication: Mobility status    Ferman Hamming 03/31/2012, 11:44 AM Acute Rehabilitation Services [phone] (pager)

## 2012-04-01 LAB — PHENYTOIN LEVEL, TOTAL: Phenytoin Lvl: 17.3 ug/mL (ref 10.0–20.0)

## 2012-04-01 LAB — BASIC METABOLIC PANEL
BUN: 13 mg/dL (ref 6–23)
Calcium: 8 mg/dL — ABNORMAL LOW (ref 8.4–10.5)
Chloride: 95 mEq/L — ABNORMAL LOW (ref 96–112)
Creatinine, Ser: 0.55 mg/dL (ref 0.50–1.10)
GFR calc Af Amer: >90 mL/min (ref >90)
GFR calc non Af Amer: 87 mL/min — ABNORMAL LOW (ref >90)

## 2012-04-01 MED ORDER — SODIUM CHLORIDE 0.9 % IV SOLN
INTRAVENOUS | Status: DC
  Administered 2012-04-01: 10:00:00 via INTRAVENOUS

## 2012-04-01 NOTE — Progress Notes (Signed)
Subjective: Pt feel some better Dilantin normal Still weak C/o some dry mouth  Objective: Vital signs in last 24 hours: Temp:  [97.4 F (36.3 C)-98.8 F (37.1 C)] 97.6 F (36.4 C) (04/27 0629) Pulse Rate:  [69-73] 73  (04/27 0629) Resp:  [16-18] 18  (04/27 0629) BP: (104-162)/(57-88) 162/88 mmHg (04/27 0629) SpO2:  [90 %-96 %] 90 % (04/27 0629) Weight:  [57.9 kg (127 lb 10.3 oz)-57.924 kg (127 lb 11.2 oz)] 57.9 kg (127 lb 10.3 oz) (04/26 2042) Weight change:  Last BM Date: 03/31/12  Intake/Output from previous day: 04/26 0701 - 04/27 0700 In: 2524.2 [P.O.:660; I.V.:1864.2] Out: 400 [Urine:400] Intake/Output this shift:    General appearance: alert Resp: clear to auscultation bilaterally Cardio: regular rate and rhythm Neurologic: Grossly normal  Lab Results:  [facility] 03/31/12 0545  WBC 7.8  HGB 9.3*  HCT 25.9*  PLT 277   BMET  Basename 04/01/12 0600 03/31/12 0545  NA 125* 122*  K 4.3 4.4  CL 95* 91*  CO2 23 24  GLUCOSE 86 86  BUN 13 15  CREATININE 0.55 0.72  CALCIUM 8.0* 8.0*    Studies/Results: Dg Swallowing Func-no Report  03/30/2012  CLINICAL DATA: dysphagia   FLUOROSCOPY FOR SWALLOWING FUNCTION STUDY:  Fluoroscopy was provided for swallowing function study, which was  administered by a speech pathologist.  Final results and recommendations  from this study are contained within the speech pathology report.      Medications: I have reviewed the patient's current medications.  Assessment/Plan: Altered mental status : Mental status improving, Dilantin, 17  Down, improving. Also sodium level is better today. I suspect possible SIADH from Dilantin toxicity. - Head CT nonacute. Stable atrophy and white matter disease. No UTI   .Hyponatremia: Continue IV hydration with normal saline Na 125 - Up from 122  Continue NS at 50 hr   .GERD (gastroesophageal reflux disease)  - Continue PPI   .Bronchiectasis without acute exacerbation  - Lungs clear, no  acute intervention needed   .HTN (hypertension)  - Patient is on propranolol, will continue with parameters mild high; will monitor; could be due to IV fluids  .Seizure disorder: Dilantin has been discontinued and will resume primidone   .Scleroderma - aware, on PPI therapy   .Dysphagia  - continue dysphagia 2 diet, thin liquids, swallow eval noted  .Weakness generalized - high Dilantin level likely contributing  - PT eval noted- encourage PT- HHN/PT  Peripheral edema:less today..  DVT prophylaxis: Lovenox - she does have a superficial DVT in the right calf, asymptomatic today  Urinary retention: Place Foley catheter today and monitor I&Os  Vulvar and perineal erythema: Possibly from simple irritation from urine. Continue Balmex cream  CODE STATUS: FULL CODE STATUS       LOS: 4 days   Durwin Davisson 04/01/2012, 8:03 AM

## 2012-04-02 LAB — BASIC METABOLIC PANEL
BUN: 10 mg/dL (ref 6–23)
Calcium: 8.2 mg/dL — ABNORMAL LOW (ref 8.4–10.5)
Chloride: 92 mEq/L — ABNORMAL LOW (ref 96–112)
Creatinine, Ser: 0.51 mg/dL (ref 0.50–1.10)
GFR calc Af Amer: >90 mL/min (ref >90)

## 2012-04-02 LAB — CBC
HCT: 28.9 % — ABNORMAL LOW (ref 36.0–46.0)
MCHC: 34.9 g/dL (ref 30.0–36.0)
Platelets: 290 10*3/uL (ref 150–400)
RDW: 15.9 % — ABNORMAL HIGH (ref 11.5–15.5)

## 2012-04-02 NOTE — Progress Notes (Signed)
Subjective: Pt feel some better Poor appetite Dilantin level normal NA stable- 125  Objective: Vital signs in last 24 hours: Temp:  [97.4 F (36.3 C)-98.4 F (36.9 C)] 98.4 F (36.9 C) (04/28 0514) Pulse Rate:  [65-80] 75  (04/28 0514) Resp:  [16-18] 16  (04/28 0514) BP: (125-167)/(61-80) 167/78 mmHg (04/28 0514) SpO2:  [90 %-93 %] 92 % (04/28 0514) Weight:  [57.607 kg (127 lb)] 57.607 kg (127 lb) (04/27 2040) Weight change: -0.318 kg (-11.2 oz) Last BM Date: 04/01/12  Intake/Output from previous day: 04/27 0701 - 04/28 0700 In: 150 [P.O.:150] Out: 5526 [Urine:5525; Stool:1] Intake/Output this shift:    General appearance: alert Cardio: regular rate and rhythm GI: soft, non-tender; bowel sounds normal; no masses,  no organomegaly  Lab Results:  [facility] 04/02/12 0712 03/31/12 0545  WBC 5.5 7.8  HGB 10.1* 9.3*  HCT 28.9* 25.9*  PLT 290 277   BMET  Basename 04/02/12 0712 04/01/12 0600  NA 125* 125*  K 3.7 4.3  CL 92* 95*  CO2 24 23  GLUCOSE 87 86  BUN 10 13  CREATININE 0.51 0.55  CALCIUM 8.2* 8.0*    Studies/Results: No results found.  Medications: I have reviewed the patient's current medications.  Assessment/Plan: Altered mental status : Mental status improving, Dilantin,normal. Also sodium level is low- stable now;Marland Kitchen I suspect possible SIADH from Dilantin toxicity- improved . - Head CT nonacute. Stable atrophy and white matter disease. No UTI   .Hyponatremia: stable D/c ivf Fluid restiction .Dehydration: improved  .GERD (gastroesophageal reflux disease)  - Continue PPI   .Bronchiectasis without acute exacerbation  - Lungs clear, no acute intervention needed   .HTN (hypertension)  - Patient is on propranolol, will continue with parameters   .Seizure disorder: Dilantin has been discontinued and will resume primidone   .Scleroderma - aware, on PPI therapy   .Dysphagia  - continue dysphagia 2 diet, thin liquids, swallow eval noted  .Weakness  generalized - HHN/PT - PT eval noted- encourage PT-  Peripheral edema:less today..  DVT prophylaxis: Lovenox - she does have a superficial DVT in the right calf, asymptomatic today  Urinary retention: d/c foley/ voiding trail. Vulvar and perineal erythema: Possibly from simple irritation from urine. Continue Balmex cream  CODE STATUS: FULL CODE STATUS    LOS: 5 days   Chanise Habeck 04/02/2012, 8:58 AM

## 2012-04-02 NOTE — Progress Notes (Signed)
Patient up in chair for 6-hours this shift, tolerated well.  Patient is a two person assist.

## 2012-04-02 NOTE — Progress Notes (Signed)
   CARE MANAGEMENT NOTE 04/02/2012  Patient:  Audrey Obrien, Audrey Obrien   Account Number:  [id number]  Date Initiated:  03/29/2012  Documentation initiated by:  Ronny Flurry  Subjective/Objective Assessment:   DX: UTI, Hyponatremia, dementia     Action/Plan:   Husband wants patient to be full code   Anticipated DC Date:  04/04/2012   Anticipated DC Plan:  HOME W HOME HEALTH SERVICES      DC Planning Services  CM consult      [facility] Choice  Resumption Of Svcs/PTA Provider   Choice offered to / List presented to:  C-3 Spouse        HH arranged  HH-2 PT      HH agency  [facility]   Status of service:  Completed, signed off Medicare Important Message given?   (If response is "NO", the following Medicare IM given date fields will be blank) Date Medicare IM given:   Date Additional Medicare IM given:    Discharge Disposition:  HOME W HOME HEALTH SERVICES  Per UR Regulation:  Reviewed for med. necessity/level of care/duration of stay  If discussed at Long Length of Stay Meetings, dates discussed:    Comments:  04/01/2012 1200  Spoke to husband. States no DME needed. States she was active with [facility] prior to admission. Contacted [facility] for resumption of HH for pt. Isidoro Donning RN CCM Case Mgmt phone [phone]

## 2012-04-03 LAB — BASIC METABOLIC PANEL
BUN: 12 mg/dL (ref 6–23)
CO2: 23 mEq/L (ref 19–32)
Chloride: 94 mEq/L — ABNORMAL LOW (ref 96–112)
GFR calc non Af Amer: 81 mL/min — ABNORMAL LOW (ref >90)
Glucose, Bld: 84 mg/dL (ref 70–99)
Potassium: 3.9 mEq/L (ref 3.5–5.1)
Sodium: 126 mEq/L — ABNORMAL LOW (ref 135–145)

## 2012-04-03 MED ORDER — PHENYTOIN SODIUM EXTENDED 100 MG PO CAPS
100.0000 mg | ORAL_CAPSULE | Freq: Two times a day (BID) | ORAL | Status: DC
  Administered 2012-04-03 – 2012-04-04 (×3): 100 mg via ORAL
  Filled 2012-04-03 (×4): qty 1

## 2012-04-03 NOTE — PMR Pre-admission (Signed)
PMR Admission Coordinator Pre-Admission Assessment  Patient: Audrey Obrien is an 78 y.o., female MRN: [id number] DOB: 05-06-1931 Height: 5\' 2"  (157.5 cm) Weight: 51.8 kg (114 lb 3.2 oz)  Insurance Information HMO:    PPO:      PCP:      IPA:      80/20: yes     OTHER: no HMO PRIMARY: Medicare a and b      Policy#: [id number]      Subscriber: pt CM Name:       Phone#:      Fax#:  Pre-Cert#:       Employer:  Benefits:  Phone #: vision share     Name: 04/03/12 Eff. Date: 12/06/97     Deduct: $1184      Out of Pocket Max: none      Life Max: none CIR: 100%      SNF: 20 full days LBD 02/24/12 Outpatient: 80%     Co-Pay: 20% Home Health: 100%      Co-Pay: none DME: 80%     Co-Pay: 20% Providers: pt choice  SECONDARY: Tricare      Policy#: [id number]      Subscriber: pt  Emergency Contact Information Contact Information    Name Relation Home Work Mobile   [place] Spouse [phone]       Current Medical History  Patient Admitting Diagnosis: Hypoxic encephalopathy, dilantin toxicity  History of Present Illness: 77 year old right-handed female admitted April 23 with altered mental status. Patient with noted urinary tract symptoms and dysuria for the past 3 weeks with urine culture negative. Per patient's husband, she was recently admitted to [facility] for corneal transplant 3 months ago where she had a difficult hospitalization, she had coded and was unresponsive placed on mechanical ventilation. She had 3 seizures in the ICU and followed by neurology placed on Dilantin and primidone and later discharged to home. She had no further seizures but her husband noted increased lethargy at home.  Noted Dilantin level of 32.3. Cranial CT scan showed no acute changes. Echocardiogram ejection fraction of 60% without emboli. Venous Dopplers with no evidence of deep vein thrombosis. Subcutaneous Lovenox as been initiated for DVT prophylaxis. A followup urine culture showed 50,000 multi-bacterial. Her  Dilantin was held and later resumed 04/03/2012 200 mg daily with latest Dilantin level 5.8 04/03/2012. She is currently on dysphagia to thin liquid diet.    The patient has a prior history of tremor and has seen neurology in the past. Her husband states she was never diagnosed with Parkinson's disease although he suspects it. She has not seen Dr. love for quite some time. The patient's husband states that the patient has really not regained her usual level of functioning since her ICU hospitalization earlier this year.   Past Medical History  Past Medical History  Diagnosis Date  . Hypertension   . Acne rosacea   . Chronic rhinitis     no benefit from antihistamines  . Gallbladder & bile duct stone 2007    1.6 cm GB stone; no symptoms  . Pneumonia 2007  . 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 C. difficile colitis 11/07  . Spastic dysphonia     botox injs at [facility]  . Hypertension   . Scleroderma     probable, ANA+ 1:160, anticentromers strongly pos, USRNP-, TH/TO-,RF equivocal, CCP-, Skin puffiness of hands, telangectasias of hands, raynauds, ECHO increased pulmonary pressures 8/11, pfts  stable  . S/P right heart catheterization 10/11  . Benign essential tremor   . Osteoporosis     started alendronate 6/12    Family History  family history includes Asthma in her son.  Prior Rehab/Hospitalizations: home health only  Current Medications  Current facility-administered medications:acetaminophen (TYLENOL) suppository 650 mg, 650 mg, Rectal, Q6H PRN, Ripudeep K Rai, MD;  acetaminophen (TYLENOL) tablet 650 mg, 650 mg, Oral, Q6H PRN, Ripudeep K Rai, MD, 650 mg at 04/01/12 1615;  antiseptic oral rinse (BIOTENE) solution 15 mL, 15 mL, Mouth Rinse, BID, Ripudeep K Rai, MD, 15 mL at 04/03/12 2040;  aspirin EC tablet 81 mg, 81 mg, Oral, QHS, Ripudeep K Rai, MD, 81 mg at 04/03/12 2202 cholecalciferol (VITAMIN D) tablet 1,000 Units, 1,000 Units, Oral, Daily, Ripudeep  K Rai, MD, 1,000 Units at 04/03/12 0902;  enoxaparin (LOVENOX) injection 40 mg, 40 mg, Subcutaneous, Q24H, Ripudeep K Rai, MD, 40 mg at 04/03/12 2202;  HYDROcodone-acetaminophen (NORCO) 5-325 MG per tablet 1 tablet, 1 tablet, Oral, Q6H PRN, Ripudeep K Rai, MD, 1 tablet at 03/30/12 1033 HYDROmorphone (DILAUDID) injection 0.5 mg, 0.5 mg, Intravenous, Q4H PRN, Ripudeep K Rai, MD, 0.5 mg at 03/31/12 2005;  magic mouthwash, 5 mL, Oral, TID AC & HS, Pearla Dubonnet, MD, 5 mL at 04/03/12 2203;  ondansetron (ZOFRAN) injection 4 mg, 4 mg, Intravenous, Q6H PRN, Ripudeep K Rai, MD;  ondansetron (ZOFRAN) tablet 4 mg, 4 mg, Oral, Q6H PRN, Ripudeep K Rai, MD pantoprazole (PROTONIX) EC tablet 80 mg, 80 mg, Oral, Q1200, Ripudeep K Rai, MD, 80 mg at 04/03/12 1312;  phenytoin (DILANTIN) ER capsule 100 mg, 100 mg, Oral, BID, Pearla Dubonnet, MD, 100 mg at 04/03/12 2202;  prednisoLONE acetate (PRED FORTE) 1 % ophthalmic suspension 1 drop, 1 drop, Left Eye, QID, Ripudeep K Rai, MD, 1 drop at 04/03/12 2203;  primidone (MYSOLINE) tablet 100 mg, 100 mg, Oral, Daily, Ripudeep K Rai, MD, 100 mg at 04/03/12 1610 primidone (MYSOLINE) tablet 50 mg, 50 mg, Oral, QHS, Ripudeep K Rai, MD, 50 mg at 04/03/12 2202;  propranolol ER (INDERAL LA) 24 hr capsule 80 mg, 80 mg, Oral, BID, Ripudeep K Rai, MD, 80 mg at 04/03/12 2202;  zinc oxide (BALMEX) 11.3 % cream, , Topical, BID, Pearla Dubonnet, MD  Patients Current Diet: Dysphagia  Precautions / Restrictions Precautions Precautions: Fall Other Brace/Splint: Eye patch L when supine, lifting, or bending over (recent corneal implant surgery) Restrictions Weight Bearing Restrictions: No Other Position/Activity Restrictions: eye patch to Left eye when pt. supine or bending or lifting   Prior Activity Level Household: household only   Journalist, newspaper / Equipment Home Assistive Devices/Equipment: Environmental consultant (specify type);Wheelchair;Shower chair without back Home Adaptive  Equipment: Wheelchair - manual;Walker - rolling;Shower chair without back;Bedside commode/3-in-1  Prior Functional Level Prior Function Level of Independence: Needs assistance Needs Assistance: Bathing;Dressing;Feeding;Grooming;Gait;Toileting;Transfers Gait Assistance: assists from back with safety belt Able to Take Stairs?: Yes Driving: No Vocation: Retired  Current Functional Level Cognition  Arousal/Alertness: Awake/alert Overall Cognitive Status: Appears within functional limits for tasks assessed/performed Difficult to assess due to: Level of arousal Orientation Level: Oriented to person;Oriented to place;Disoriented to time;Disoriented to situation    Sensation       Coordination       ADLs       Mobility  Bed Mobility: Rolling Right;Right Sidelying to Sit Rolling Right: 3: Mod assist Right Sidelying to Sit: 1: +2 Total assist;HOB elevated;With rails Right Sidelying to Sit: Patient Percentage: 40%  Transfers  Transfers: Sit to Stand;Stand to Sit Sit to Stand: 3: Mod assist;With armrests;From chair/3-in-1 Sit to Stand: Patient Percentage: 40% Stand to Sit: 3: Mod assist;To chair/3-in-1 Stand to Sit: Patient Percentage: 40%    Ambulation / Gait / Stairs / Wheelchair Mobility  Ambulation/Gait Ambulation/Gait Assistance: 1: +2 Total assist Ambulation/Gait: Patient Percentage: 50 Ambulation Distance (Feet): 80 Feet (X2) Assistive device: Rolling walker;2 person hand held assist Ambulation/Gait Assistance Details: Noted RW seems to get in the way with progressive ambulation, and husband stated a threapist (presumably HHPT) recommended pt not to use it -- we used 2 person handheld assist to tap into smoother gait pattern; With and without RW, noted significant posterior lean during amb, putting pt at high fall risk Gait Pattern: Step-through pattern;Shuffle    Posture / Balance Static Sitting Balance Static Sitting - Balance Support: Bilateral upper extremity  supported;Feet supported (assist for proper hand placement) Static Sitting - Level of Assistance: 4: Min assist;3: Mod assist;Other (comment) (fluctuating and lean to right)     Previous Home Environment Living Arrangements: Spouse/significant other Lives With: Spouse Type of Home: House Home Layout: Two level;Full bath on main level;Able to live on main level with bedroom/bathroom Home Access: Stairs to enter Entrance Stairs-Rails: None Entrance Stairs-Number of Steps: 2 Bathroom Shower/Tub: Walk-in shower;Door [employer] Toilet: Handicapped height Bathroom Accessibility: Yes How Accessible: Accessible via wheelchair Home Care Services: Yes Type of Home Care Services: Home PT [facility]if known[facility]  Discharge Living Setting Plans for Discharge Living Setting: Patient's home;Lives with (comment) (spouse) Type of Home at Discharge: House Discharge Home Layout: Two level;Full bath on main level;Able to live on main level with bedroom/bathroom Discharge Home Access: Stairs to enter Entrance Stairs-Number of Steps: none Discharge Bathroom Shower/Tub: Walk-in shower Discharge Bathroom Toilet: Handicapped height Discharge Bathroom Accessibility: Yes How Accessible: Accessible via wheelchair;Accessible via walker Do you have any problems obtaining your medications?: No  Social/Family/Support Systems Patient Roles: Spouse;Parent Contact Information: Jonise Weightman, spouse Anticipated Caregiver: spouse, daughters assist intermittently Anticipated Caregiver's Contact Information: [phone] Ability/Limitations of Caregiver: min assist Caregiver Availability: 24/7 Discharge Plan Discussed with Primary Caregiver: Yes Is Caregiver In Agreement with Plan?: Yes Does Caregiver/Family have Issues with Lodging/Transportation while Pt is in Rehab?: No Spouse former [employer]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, Civil Service  Goals/Additional Needs Patient/Family  Goal for Rehab: supervision PT, supervision to min assist OT, supervision with SLP Expected length of stay: ELOS 2 weeks Pt/Family Agrees to Admission and willing to participate: Yes Program Orientation Provided & Reviewed with Pt/Caregiver Including Roles  & Responsibilities: Yes  Patient Condition: This patient's condition remains as documented in the Consult dated 04/03/12, in which the Rehabilitation Physician determined and documented that the patient's condition is appropriate for intensive rehabilitative care in an inpatient rehabilitation facility.  Preadmission Screen Completed By:  Clois Dupes, 04/04/2012 7:39 AM ______________________________________________________________________   Discussed status with Dr. Riley Kill on 04/04/12 at [phone] and received telephone approval for admission today.  Admission Coordinator:  Clois Dupes, time 0865 Date 04/04/12.

## 2012-04-03 NOTE — Progress Notes (Signed)
Speech Language Pathology Dysphagia Treatment Patient Details Name: Audrey Obrien MRN: [id number] DOB: 04-21-1931 Today's Date: 04/03/2012 Time: 4098-1191 SLP Time Calculation (min): 13 min  Assessment / Plan / Recommendation Clinical Impression  Pt. looks as if she feels better than last week.  Pt. required moderate verbal cues to recall swallow precautions (alternate liquids and solids).  SLP did not observe her with solids therefore, unable to determine her implementation of strategy.  Took small sips thin liquid vis straw without s/s aspiration.  Pt. stated she would like to remain on the Dys 2 (minced) diet.  Reviewed clinical reasoning for sitting upright 45-60 minutes after eating.    Diet Recommendation  Continue with Current Diet: Dysphagia 2 (fine chop);Thin liquid    SLP Plan Continue with current plan of care   Pertinent Vitals/Pain    Swallowing Goals  SLP Swallowing Goals Patient will consume recommended diet without observed clinical signs of aspiration with: Minimal cueing Swallow Study Goal #1 - Progress: Not Met Patient will utilize recommended strategies during swallow to increase swallowing safety with: Minimal cueing Swallow Study Goal #2 - Progress: Not met  General Temperature Spikes Noted: No Respiratory Status: Room air Behavior/Cognition: Alert;Cooperative;Pleasant mood Oral Cavity - Dentition: Adequate natural dentition Patient Positioning: Upright in bed  Oral Cavity - Oral Hygiene Does patient have any of the following "at risk" factors?: Saliva - thick, dry mouth Brush patient's teeth BID with toothbrush (using toothpaste with fluoride): Yes Patient is HIGH RISK - Oral Care Protocol followed (see row info): Yes   Dysphagia Treatment Treatment focused on: Skilled observation of diet tolerance;Patient/family/caregiver education;Utilization of compensatory strategies Treatment Methods/Modalities: Skilled observation Patient observed directly with PO's:  Yes Type of PO's observed: Thin liquids Feeding: Able to feed self Liquids provided via: Straw Type of cueing: Verbal Amount of cueing: Moderate   Breck Coons [place]Ed [employer] [phone]  04/03/2012

## 2012-04-03 NOTE — Progress Notes (Signed)
I met with patient and her spouse at bedside. Patient will benefit from an admission on inpatient rehab and both are in agreement. Bed is available tomorrow. Please call me to verify in the morning so that I can arrange. Cell [phone]

## 2012-04-03 NOTE — Progress Notes (Signed)
Physical Therapy Treatment Patient Details Name: Audrey Obrien MRN: [id number] DOB: July 26, 1931 Today's Date: 04/03/2012 Time: 1136-1207 PT Time Calculation (min): 31 min  PT Assessment / Plan / Recommendation Comments on Treatment Session  [name] very interested and hopeful to take pt home (wondering if it will be today); While ultimately it is the patient and husband's choice to dc home with [facility] PT follow up (versus looking into a rehab stay), it is still worth considering a Rehab Consult to look into the possibility of a CIR stay to maximize independence and safety with mobility and amb prior to dc home; Discussed case with RN as well; [name] would like to talk with [name]; Placed a page for [name]    Follow Up Recommendations  Inpatient Rehab;Supervision/Assistance - 24 hour;Home health PT (It is possible pt's husband will decline rehab)    Equipment Recommendations  None recommended by PT    Frequency Min 3X/week   Plan Discharge plan needs to be updated;Frequency remains appropriate (Worth getting a rehab consult to consider CIR to maximize independence and safety with mobility prior to dc home)    Precautions / Restrictions Precautions Precautions: Fall Required Braces or Orthoses: Other Brace/Splint Other Brace/Splint: Eye patch L when supine, lifting, or bending over (recent corneal implant surgery) Restrictions Other Position/Activity Restrictions: eye patch to Left eye when pt. supine or bending or lifting   Pertinent Vitals/Pain No acute distress     Mobility  Transfers Transfers: Sit to Stand;Stand to Sit Sit to Stand: 3: Mod assist;With armrests;From chair/3-in-1 Stand to Sit: 3: Mod assist;To chair/3-in-1 Details for Transfer Assistance: Cues for hand placement and safe technique; worked on intitiating from an anterior weight shift Ambulation/Gait Ambulation/Gait Assistance: 1: +2 Total assist Ambulation/Gait: Patient Percentage:  50 Ambulation Distance (Feet): 80 Feet (X2) Assistive device: Rolling walker;2 person hand held assist Ambulation/Gait Assistance Details: Noted RW seems to get in the way with progressive ambulation, and husband stated a threapist (presumably HHPT) recommended pt not to use it -- we used 2 person handheld assist to tap into smoother gait pattern; With and without RW, noted significant posterior lean during amb, putting pt at high fall risk Gait Pattern: Step-through pattern;Shuffle         PT Goals Acute Rehab PT Goals Time For Goal Achievement: 04/13/12 Potential to Achieve Goals: Fair Pt will go Sit to Stand: with min assist PT Goal: Sit to Stand - Progress: Progressing toward goal Pt will go Stand to Sit: with min assist PT Goal: Stand to Sit - Progress: Progressing toward goal Pt will Ambulate: 51 - 150 feet;with min assist;with least restrictive assistive device PT Goal: Ambulate - Progress: Progressing toward goal  Visit Information  Last PT Received On: 04/03/12 Assistance Needed: +2    Subjective Data  Subjective: Pt reports feeling better post ambulating; Husband is hopeful for dc home today   Cognition  Overall Cognitive Status: Appears within functional limits for tasks assessed/performed Arousal/Alertness: Awake/alert Orientation Level: Appears intact for tasks assessed Behavior During Session: [facility] for tasks performed         End of Session PT - End of Session Equipment Utilized During Treatment: Gait belt Activity Tolerance: Patient tolerated treatment well Patient left: in chair;with call bell/phone within reach;with family/visitor present Nurse Communication: Mobility status    Olen Pel [place], [place] [phone]  04/03/2012, 12:41 PM

## 2012-04-03 NOTE — Progress Notes (Signed)
Subjective: Patient sleeping heavily but was arousable. States that she has not been up walking and by physical therapy's assessment she is a two-person assist for transfers. Will contact husband about a home needs and make sure that she is at baseline. She will need her Dilantin restarted at 100 mg twice a day. Sodium up to baseline at mid 120s  Objective: Weight change: -5.715 kg (-12 lb 9.6 oz)  Intake/Output Summary (Last 24 hours) at 04/03/12 0733 Last data filed at 04/03/12 0503  Gross per 24 hour  Intake    510 ml  Output   2601 ml  Net  -2091 ml   Filed Vitals:   04/02/12 1437 04/02/12 1620 04/02/12 2014 04/03/12 0504  BP: 147/76 156/76 169/77 164/82  Pulse: 64 69 78 75  Temp: 99.1 F (37.3 C) 98.2 F (36.8 C) 98.3 F (36.8 C) 97.6 F (36.4 C)  TempSrc: Oral Oral Oral Oral  Resp: 18 17 18 18   Height:   5\' 2"  (1.575 m)   Weight:   51.891 kg (114 lb 6.4 oz)   SpO2: 93% 92% 91% 92%    General: Arousable and conversive. Reports that she has been doing physical therapy HEENT: Eyes are open today, dry mucosal membranes. Left eye patch in place  Neck: supple, no masses or lymphadenopathy, no goiter, no bruits  Heart: Regular rate and rhythm, without murmurs, rubs or gallops.  Lungs: Clear to auscultation bilaterally, no wheezing, rales or rhonchi.  Abdomen: No tenderness in the suprapubic region Soft, nondistended, positive bowel sounds, no masses.  Extremities: No clubbing, cyanosis or edema with positive pedal pulses. Trace peripheral edema in the ankles and feet, improved  Neuro: Arousable and alert, nonfocal  Skin: no rashes or lesions, warm and dry   Lab Results:  Basename 04/02/12 0712 04/01/12 0600  NA 125* 125*  K 3.7 4.3  CL 92* 95*  CO2 24 23  GLUCOSE 87 86  BUN 10 13  CREATININE 0.51 0.55  CALCIUM 8.2* 8.0*  MG -- --  PHOS -- --   No results found for this basename: AST:2,ALT:2,ALKPHOS:2,BILITOT:2,PROT:2,ALBUMIN:2 in the last 72 hours No results  found for this basename: LIPASE:2,AMYLASE:2 in the last 72 hours  Basename 04/02/12 0712  WBC 5.5  NEUTROABS --  HGB 10.1*  HCT 28.9*  MCV 93.8  PLT 290   No results found for this basename: CKTOTAL:3,CKMB:3,CKMBINDEX:3,TROPONINI:3 in the last 72 hours No components found with this basename: POCBNP:3 No results found for this basename: DDIMER:2 in the last 72 hours No results found for this basename: HGBA1C:2 in the last 72 hours No results found for this basename: CHOL:2,HDL:2,LDLCALC:2,TRIG:2,CHOLHDL:2,LDLDIRECT:2 in the last 72 hours No results found for this basename: TSH,T4TOTAL,FREET3,T3FREE,THYROIDAB in the last 72 hours No results found for this basename: VITAMINB12:2,FOLATE:2,FERRITIN:2,TIBC:2,IRON:2,RETICCTPCT:2 in the last 72 hours  Studies/Results: No results found. Medications: Scheduled Meds:   . antiseptic oral rinse  15 mL Mouth Rinse BID  . aspirin EC  81 mg Oral QHS  . cholecalciferol  1,000 Units Oral Daily  . enoxaparin  40 mg Subcutaneous Q24H  . magic mouthwash  5 mL Oral TID AC & HS  . pantoprazole  80 mg Oral Q1200  . phenytoin  100 mg Oral BID  . prednisoLONE acetate  1 drop Left Eye QID  . primidone  100 mg Oral Daily  . primidone  50 mg Oral QHS  . propranolol ER  80 mg Oral BID  . zinc oxide   Topical BID  Continuous Infusions:   . DISCONTD: sodium chloride 50 mL/hr at 04/01/12 0945   PRN Meds:.acetaminophen, acetaminophen, HYDROcodone-acetaminophen, HYDROmorphone, ondansetron (ZOFRAN) IV, ondansetron  Assessment/Plan:  Altered mental status : Mental status back to baseline. We'll resume Dilantin at 200 mg daily   .UTI (lower urinary tract infection):  likely no significant UTI   .Hyponatremia:  Back to baseline admitted 127 125   .Dehydration: Resolved   .GERD (gastroesophageal reflux disease)  - Continue PPI   .Bronchiectasis without acute exacerbation  - Lungs clear, no acute intervention needed   .HTN (hypertension)  -  Patient is on propranolol, will continue with parameters   .Seizure disorder: Dilantin has been resumed today at 100 mg twice a day   .Scleroderma - aware, on PPI therapy   .Dysphagia  - continue dysphagia 2 diet, thin liquids after speech evaluation performed today   .Weakness generalized - continue home PT OT consult at discharge  Peripheral edema:  Improving with leg elevation - likely contributed to by pulmonary hypertension   DVT prophylaxis: Lovenox - she does have a superficial DVT in the right calf, asymptomatic today   Urinary retention:  Remove Foley   Vulvar and perineal erythema: Improved with Balmex cream   CODE STATUS: FULL CODE STATUS  Disposition: Possible discharge today    LOS: 6 days   Audrey Obrien NEVILL 04/03/2012, 7:33 AM

## 2012-04-03 NOTE — Consult Note (Signed)
Physical Medicine and Rehabilitation Consult Reason for Consult: Altered mental status/Dilantin toxicity Referring Phsyician: Dr. Michelle Nasuti Audrey Obrien is an 78 y.o. female.   HPI: 78 year old right-handed female admitted April 23 with altered mental status. Patient with noted urinary tract symptoms and dysuria for the past 3 weeks with urine culture negative. Per patient's husband, she was recently admitted to [facility] for corneal transplant 3 months ago where she had a difficult hospitalization, she had coded and was unresponsive placed on mechanical ventilation. She had 3 seizures in the ICU and followed by neurology placed on Dilantin and primidone and later discharged to home. She had no further seizures but her husband noted increased lethargy at home. Noted Dilantin level of 32.3. Cranial CT scan showed no acute changes. Echocardiogram ejection fraction of 60% without emboli. Venous Dopplers with no evidence of deep vein thrombosis. Subcutaneous Lovenox as been initiated for DVT prophylaxis. A followup urine culture showed 50,000 multi-bacterial. Her Dilantin was held and later resumed 04/03/2012 200 mg daily with latest Dilantin level 5.8 04/03/2012. She is currently on dysphagia to thin liquid diet. Physical therapy ongoing recommendations of physical medicine rehabilitation consult to consider inpatient rehabilitation services. The patient has a prior history of tremor and has seen neurology in the past. Her husband states she was never diagnosed with Parkinson's disease although he suspects it. She has not seen Dr. love for quite some time. The patient's husband states that the patient has really not regained her usual level of functioning since her ICU hospitalization earlier this year. Review of Systems  Constitutional: Positive for malaise/fatigue.  Eyes: Positive for blurred vision.  Gastrointestinal: Positive for constipation.  Neurological: Positive for seizures.  All other systems  reviewed and are negative.   Past Medical History  Diagnosis Date  . Hypertension   . Acne rosacea   . Chronic rhinitis     no benefit from antihistamines  . Gallbladder & bile duct stone 2007    1.6 cm GB stone; no symptoms  . Pneumonia 2007  . 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 C. difficile colitis 11/07  . Spastic dysphonia     botox injs at [facility]  . Hypertension   . Scleroderma     probable, ANA+ 1:160, anticentromers strongly pos, USRNP-, TH/TO-,RF equivocal, CCP-, Skin puffiness of hands, telangectasias of hands, raynauds, ECHO increased pulmonary pressures 8/11, pfts stable  . S/P right heart catheterization 10/11  . Benign essential tremor   . Osteoporosis     started alendronate 6/12   Past Surgical History  Procedure Date  . Lung surgery 1982  . Corneal transplant    Family History  Problem Relation Age of Onset  . Asthma Son    Social History:  reports that she has never smoked. She has never used smokeless tobacco. She reports that she does not drink alcohol or use illicit drugs. Allergies: No Known Allergies Medications Prior to Admission  Medication Sig Dispense Refill  . aspirin 81 MG tablet Take 81 mg by mouth at bedtime.       . Cholecalciferol (VITAMIN D) 1000 UNITS capsule Take 1,000 Units by mouth daily.        . clarithromycin (BIAXIN) 500 MG tablet Take 500 mg by mouth 2 (two) times daily.        Marland Kitchen esomeprazole (NEXIUM) 40 MG capsule Take 40 mg by mouth daily.        Marland Kitchen loratadine (CLARITIN) 10  MG tablet Take 10 mg by mouth every evening.      . Methenamine-Sodium Salicylate (CYSTEX PO) Take 1 tablet by mouth 3 (three) times daily.      Marland Kitchen METRONIDAZOLE, TOPICAL, (METROLOTION) 0.75 % LOTN Apply 1 application topically 2 (two) times daily as needed. For rosecea      . phenytoin (DILANTIN) 50 MG tablet Chew 100 mg by mouth 3 (three) times daily.      . prednisoLONE acetate (PRED FORTE) 1 % ophthalmic suspension Place 1  drop into the left eye 4 (four) times daily.      . primidone (MYSOLINE) 50 MG tablet Take 50-100 mg by mouth 2 (two) times daily. 2 tabs in the morning and 1 tab at night      . propranolol (INDERAL LA) 80 MG 24 hr capsule Take 80 mg by mouth 2 (two) times daily.         Home: Home Living Lives With: Spouse Available Help at Discharge: Available 24 hours/day;Family Type of Home: House Home Access: Stairs to enter [employer] of Steps: 2 Entrance Stairs-Rails: None Home Layout: Two level;Full bath on main level;Able to live on main level with bedroom/bathroom Bathroom Shower/Tub: Walk-in shower;Door [employer] Toilet: Handicapped height Bathroom Accessibility: Yes How Accessible: Accessible via wheelchair Home Adaptive Equipment: Wheelchair - manual;Walker - rolling;Shower chair without back;Bedside commode/3-in-1  Functional History: Prior Function Able to Take Stairs?: Yes Driving: No Vocation: Retired Functional Status:  Mobility: Bed Mobility Bed Mobility: Rolling Right;Right Sidelying to Sit Rolling Right: 3: Mod assist Right Sidelying to Sit: 1: +2 Total assist;HOB elevated;With rails Right Sidelying to Sit: Patient Percentage: 40% Transfers Transfers: Sit to Stand;Stand to Sit Sit to Stand: 3: Mod assist;With armrests;From chair/3-in-1 Sit to Stand: Patient Percentage: 40% Stand to Sit: 3: Mod assist;To chair/3-in-1 Stand to Sit: Patient Percentage: 40% Ambulation/Gait Ambulation/Gait Assistance: 1: +2 Total assist Ambulation/Gait: Patient Percentage: 50 Ambulation Distance (Feet): 80 Feet (X2) Assistive device: Rolling walker;2 person hand held assist Ambulation/Gait Assistance Details: Noted RW seems to get in the way with progressive ambulation, and husband stated a threapist (presumably HHPT) recommended pt not to use it -- we used 2 person handheld assist to tap into smoother gait pattern; With and without RW, noted significant posterior lean during amb,  putting pt at high fall risk Gait Pattern: Step-through pattern;Shuffle    ADL:    Cognition: Cognition Arousal/Alertness: Awake/alert Orientation Level: Oriented to person;Oriented to place;Disoriented to time Cognition Overall Cognitive Status: Appears within functional limits for tasks assessed/performed Difficult to assess due to: Level of arousal Arousal/Alertness: Awake/alert Orientation Level: Appears intact for tasks assessed Behavior During Session: [facility] for tasks performed  Blood pressure 128/67, pulse 62, temperature 98 F (36.7 C), temperature source Oral, resp. rate 17, height 5\' 2"  (1.575 m), weight 51.891 kg (114 lb 6.4 oz), SpO2 92.00%. Physical Exam  Vitals reviewed. HENT:  Head: Normocephalic.  Eyes:       Mild left eye ptosis  Neck: Neck supple. No thyromegaly present.  Cardiovascular: Regular rhythm.   Pulmonary/Chest: Breath sounds normal. She has no wheezes.  Abdominal: She exhibits no distension. There is no tenderness.  Musculoskeletal: She exhibits no edema.  Neurological: She is alert.       Speech is a bit halting but fully intelligible. She is alert and oriented x3 and follows commands. She exhibits no tremor activity  Skin: Skin is warm and dry.  Psychiatric: She has a normal mood and affect.   general: Mask  facies,  microstomia, xerostomia Motor strength is 4/5 in bilateral deltoid, biceps, triceps, grip 3 minus/5 in the hip flexors, knee extensors ankle dorsiflexors. Tone there is lead pipe type rigidity in the biceps. There is decreased finger to thumb opposition bilaterally. There is a resting tremor in bilateral upper extremities. There is a lingual tremor as well. Sensation is intact to light touch No evidence of peripheral edema No evidence of joint swelling  Results for orders placed during the hospital encounter of 03/28/12 (from the past 24 hour(s))  PHENYTOIN LEVEL, TOTAL     Status: Abnormal   Collection Time   04/03/12  5:55 AM       Component Value Range   Phenytoin Lvl 5.8 (*) 10.0 - 20.0 (ug/mL)  BASIC METABOLIC PANEL     Status: Abnormal   Collection Time   04/03/12  5:55 AM      Component Value Range   Sodium 126 (*) 135 - 145 (mEq/L)   Potassium 3.9  3.5 - 5.1 (mEq/L)   Chloride 94 (*) 96 - 112 (mEq/L)   CO2 23  19 - 32 (mEq/L)   Glucose, Bld 84  70 - 99 (mg/dL)   BUN 12  6 - 23 (mg/dL)   Creatinine, Ser 4.09  0.50 - 1.10 (mg/dL)   Calcium 8.3 (*) 8.4 - 10.5 (mg/dL)   GFR calc non Af Amer 81 (*) >90 (mL/min)   GFR calc Af Amer >90  >90 (mL/min)   No results found.  Assessment/Plan: Diagnosis: Declining functional status which started after an ICU stay at least a couple months ago. She had seizure and respiratory arrest. I suspect she may have had a hypoxic encephalopathy. She is not clearing as much as I would expect if this was a simple Dilantin toxicity 1. Does the need for close, 24 hr/day medical supervision in concert with the patient's rehab needs make it unreasonable for this patient to be served in a less intensive setting? Yes 2. Co-Morbidities requiring supervision/potential complications: Scleroderma, chronic tremor, spastic dysphonia, seizure disorder 3. Due to bladder management, bowel management, safety, skin/wound care, disease management and medication administration, does the patient require 24 hr/day rehab nursing? Yes 4. Does the patient require coordinated care of a physician, rehab nurse, PT (1-2 hrs/day, 5 days/week), OT (1-2 hrs/day, 5 days/week) and SLP (0.5-1 hrs/day, 5 days/week) to address physical and functional deficits in the context of the above medical diagnosis(es)? Yes Addressing deficits in the following areas: balance, endurance, locomotion, strength, transferring, bowel/bladder control, bathing, dressing, grooming, toileting and cognition 5. Can the patient actively participate in an intensive therapy program of at least 3 hrs of therapy per day at least 5 days per week?  Yes 6. The potential for patient to make measurable gains while on inpatient rehab is good 7. Anticipated functional outcomes upon discharge from inpatient rehab are supervision mobility with PT, supervision ADLs with OT, 100% orientation, express basic needs with SLP. 8. Estimated rehab length of stay to reach the above functional goals is: 2 weeks 9. Does the patient have adequate social supports to accommodate these discharge functional goals? Yes 10. Anticipated D/C setting: Home 11. Anticipated post D/C treatments: HH therapy 12. Overall Rehab/Functional Prognosis: good  RECOMMENDATIONS: This patient's condition is appropriate for continued rehabilitative care in the following setting: CIR Patient has agreed to participate in recommended program. Potentially Note that insurance prior authorization may be required for reimbursement for recommended care.  Comment:   ANGIULLI,DANIEL J. 04/03/2012

## 2012-04-04 ENCOUNTER — Inpatient Hospital Stay (HOSPITAL_COMMUNITY)
Admission: RE | Admit: 2012-04-04 | Discharge: 2012-04-19 | DRG: 945 | Disposition: A | Discharge status: Home / Self Care | Payer: Medicare Other | Source: Ambulatory Visit | Attending: Physical Medicine & Rehabilitation | Admitting: Physical Medicine & Rehabilitation

## 2012-04-04 DIAGNOSIS — Z5189 Encounter for other specified aftercare: Principal | ICD-10-CM

## 2012-04-04 DIAGNOSIS — R5381 Other malaise: Secondary | ICD-10-CM

## 2012-04-04 DIAGNOSIS — T420X5A Adverse effect of hydantoin derivatives, initial encounter: Secondary | ICD-10-CM

## 2012-04-04 DIAGNOSIS — G250 Essential tremor: Secondary | ICD-10-CM

## 2012-04-04 DIAGNOSIS — M810 Age-related osteoporosis without current pathological fracture: Secondary | ICD-10-CM

## 2012-04-04 DIAGNOSIS — Z7982 Long term (current) use of aspirin: Secondary | ICD-10-CM

## 2012-04-04 DIAGNOSIS — Z947 Corneal transplant status: Secondary | ICD-10-CM

## 2012-04-04 DIAGNOSIS — K5900 Constipation, unspecified: Secondary | ICD-10-CM

## 2012-04-04 DIAGNOSIS — G931 Anoxic brain damage, not elsewhere classified: Secondary | ICD-10-CM

## 2012-04-04 DIAGNOSIS — N390 Urinary tract infection, site not specified: Secondary | ICD-10-CM

## 2012-04-04 DIAGNOSIS — I10 Essential (primary) hypertension: Secondary | ICD-10-CM

## 2012-04-04 DIAGNOSIS — Z79899 Other long term (current) drug therapy: Secondary | ICD-10-CM

## 2012-04-04 DIAGNOSIS — G40909 Epilepsy, unspecified, not intractable, without status epilepticus: Secondary | ICD-10-CM

## 2012-04-04 DIAGNOSIS — R269 Unspecified abnormalities of gait and mobility: Secondary | ICD-10-CM

## 2012-04-04 DIAGNOSIS — A310 Pulmonary mycobacterial infection: Secondary | ICD-10-CM | POA: Diagnosis present

## 2012-04-04 DIAGNOSIS — E871 Hypo-osmolality and hyponatremia: Secondary | ICD-10-CM

## 2012-04-04 LAB — CREATININE, SERUM
GFR calc Af Amer: >90 mL/min (ref >90)
GFR calc non Af Amer: 81 mL/min — ABNORMAL LOW (ref >90)

## 2012-04-04 LAB — PHENYTOIN LEVEL, TOTAL: Phenytoin Lvl: 6 ug/mL — ABNORMAL LOW (ref 10.0–20.0)

## 2012-04-04 MED ORDER — VITAMIN D3 25 MCG (1000 UNIT) PO TABS
1000.0000 [IU] | ORAL_TABLET | Freq: Every day | ORAL | Status: DC
  Administered 2012-04-05 – 2012-04-19 (×15): 1000 [IU] via ORAL
  Filled 2012-04-04 (×18): qty 1

## 2012-04-04 MED ORDER — ENOXAPARIN SODIUM 40 MG/0.4ML ~~LOC~~ SOLN
40.0000 mg | SUBCUTANEOUS | Status: DC
  Administered 2012-04-04 – 2012-04-18 (×15): 40 mg via SUBCUTANEOUS
  Filled 2012-04-04 (×16): qty 0.4

## 2012-04-04 MED ORDER — PROPRANOLOL HCL ER 80 MG PO CP24
80.0000 mg | ORAL_CAPSULE | Freq: Two times a day (BID) | ORAL | Status: DC
  Administered 2012-04-04 – 2012-04-19 (×30): 80 mg via ORAL
  Filled 2012-04-04 (×34): qty 1

## 2012-04-04 MED ORDER — PANTOPRAZOLE SODIUM 40 MG PO TBEC: 80.0000 mg | DELAYED_RELEASE_TABLET | Freq: Every day | ORAL | Status: DC

## 2012-04-04 MED ORDER — PHENYTOIN SODIUM EXTENDED 100 MG PO CAPS
100.0000 mg | ORAL_CAPSULE | Freq: Two times a day (BID) | ORAL | Status: DC
  Administered 2012-04-04 – 2012-04-12 (×16): 100 mg via ORAL
  Filled 2012-04-04 (×18): qty 1

## 2012-04-04 MED ORDER — PANTOPRAZOLE SODIUM 40 MG PO TBEC
80.0000 mg | DELAYED_RELEASE_TABLET | Freq: Every day | ORAL | Status: DC
  Administered 2012-04-05 – 2012-04-18 (×14): 80 mg via ORAL
  Filled 2012-04-04 (×2): qty 2
  Filled 2012-04-04 (×2): qty 1
  Filled 2012-04-04: qty 2
  Filled 2012-04-04 (×2): qty 1
  Filled 2012-04-04 (×6): qty 2
  Filled 2012-04-04: qty 1
  Filled 2012-04-04 (×4): qty 2

## 2012-04-04 MED ORDER — ASPIRIN EC 81 MG PO TBEC
81.0000 mg | DELAYED_RELEASE_TABLET | Freq: Every day | ORAL | Status: DC
  Administered 2012-04-04 – 2012-04-18 (×15): 81 mg via ORAL
  Filled 2012-04-04 (×18): qty 1

## 2012-04-04 MED ORDER — PREDNISOLONE ACETATE 1 % OP SUSP
1.0000 [drp] | Freq: Four times a day (QID) | OPHTHALMIC | Status: DC
  Administered 2012-04-04 – 2012-04-06 (×6): 1 [drp] via OPHTHALMIC

## 2012-04-04 MED ORDER — ZINC OXIDE 11.3 % EX CREA
TOPICAL_CREAM | Freq: Two times a day (BID) | CUTANEOUS | Status: DC
  Administered 2012-04-05 – 2012-04-11 (×13): via TOPICAL
  Administered 2012-04-11: 1 via TOPICAL
  Administered 2012-04-12 – 2012-04-13 (×3): via TOPICAL
  Administered 2012-04-13: 1 via TOPICAL
  Administered 2012-04-14 – 2012-04-19 (×11): via TOPICAL
  Filled 2012-04-04 (×3): qty 56

## 2012-04-04 MED ORDER — PRIMIDONE 50 MG PO TABS
50.0000 mg | ORAL_TABLET | Freq: Every day | ORAL | Status: DC
  Administered 2012-04-04 – 2012-04-18 (×15): 50 mg via ORAL
  Filled 2012-04-04 (×18): qty 1

## 2012-04-04 MED ORDER — BIOTENE DRY MOUTH MT LIQD
15.0000 mL | Freq: Two times a day (BID) | OROMUCOSAL | Status: DC
  Administered 2012-04-04 – 2012-04-19 (×25): 15 mL via OROMUCOSAL

## 2012-04-04 MED ORDER — ONDANSETRON HCL 4 MG/2ML IJ SOLN: 4.0000 mg | Freq: Four times a day (QID) | INTRAMUSCULAR | Status: DC | PRN

## 2012-04-04 MED ORDER — POLYETHYLENE GLYCOL 3350 17 G PO PACK
17.0000 g | PACK | Freq: Every day | ORAL | Status: DC | PRN
  Filled 2012-04-04: qty 1

## 2012-04-04 MED ORDER — PRIMIDONE 50 MG PO TABS
100.0000 mg | ORAL_TABLET | Freq: Every day | ORAL | Status: DC
  Administered 2012-04-05 – 2012-04-19 (×15): 100 mg via ORAL
  Filled 2012-04-04 (×18): qty 2

## 2012-04-04 MED ORDER — ACETAMINOPHEN 325 MG PO TABS: 325.0000 mg | ORAL_TABLET | ORAL | Status: DC | PRN

## 2012-04-04 MED ORDER — MAGIC MOUTHWASH: 5.0000 mL | Freq: Three times a day (TID) | ORAL | Status: DC

## 2012-04-04 MED ORDER — HYDROCODONE-ACETAMINOPHEN 5-325 MG PO TABS
1.0000 | ORAL_TABLET | Freq: Four times a day (QID) | ORAL | Status: DC | PRN
  Administered 2012-04-05 – 2012-04-11 (×4): 1 via ORAL
  Filled 2012-04-04 (×4): qty 1

## 2012-04-04 MED ORDER — ONDANSETRON HCL 4 MG PO TABS: 4.0000 mg | ORAL_TABLET | Freq: Four times a day (QID) | ORAL | Status: DC | PRN

## 2012-04-04 MED ORDER — MAGIC MOUTHWASH
5.0000 mL | Freq: Three times a day (TID) | ORAL | Status: DC
  Administered 2012-04-04 – 2012-04-19 (×54): 5 mL via ORAL
  Filled 2012-04-04 (×64): qty 5

## 2012-04-04 MED ORDER — SORBITOL 70 % SOLN
30.0000 mL | Freq: Every day | Status: DC | PRN
  Administered 2012-04-09: 30 mL via ORAL
  Filled 2012-04-04 (×2): qty 30

## 2012-04-04 MED ORDER — PHENYTOIN SODIUM EXTENDED 100 MG PO CAPS: 100.0000 mg | ORAL_CAPSULE | Freq: Two times a day (BID) | ORAL | Status: DC

## 2012-04-04 NOTE — Progress Notes (Signed)
Nutrition Follow-up  Intake variable, ranging from 25 -65%. Husband suspects intake will continue to improve with time. States that pt will drink [employer] at least daily. Planning to go to CIR later today. Weight stable at this time.  Diet Order:  Dysphagia 2 - thin liquids  Meds: Scheduled Meds:   . antiseptic oral rinse  15 mL Mouth Rinse BID  . aspirin EC  81 mg Oral QHS  . cholecalciferol  1,000 Units Oral Daily  . enoxaparin  40 mg Subcutaneous Q24H  . magic mouthwash  5 mL Oral TID AC & HS  . pantoprazole  80 mg Oral Q1200  . phenytoin  100 mg Oral BID  . prednisoLONE acetate  1 drop Left Eye QID  . primidone  100 mg Oral Daily  . primidone  50 mg Oral QHS  . propranolol ER  80 mg Oral BID  . zinc oxide   Topical BID   Continuous Infusions:  PRN Meds:.acetaminophen, acetaminophen, HYDROcodone-acetaminophen, HYDROmorphone, ondansetron (ZOFRAN) IV, ondansetron  Labs:  CMP     Component Value Date/Time   NA 126* 04/03/2012 0555   K 3.9 04/03/2012 0555   CL 94* 04/03/2012 0555   CO2 23 04/03/2012 0555   GLUCOSE 84 04/03/2012 0555   BUN 12 04/03/2012 0555   CREATININE 0.67 04/04/2012 0555   CALCIUM 8.3* 04/03/2012 0555   PROT 5.6* 03/29/2012 0540   ALBUMIN 2.8* 03/29/2012 0540   AST 18 03/29/2012 0540   ALT 9 03/29/2012 0540   ALKPHOS 101 03/29/2012 0540   BILITOT 0.3 03/29/2012 0540   GFRNONAA 81* 04/04/2012 0555   GFRAA >90 04/04/2012 0555     Intake/Output Summary (Last 24 hours) at 04/04/12 1141 Last data filed at 04/04/12 0700  Gross per 24 hour  Intake    680 ml  Output    150 ml  Net    530 ml   Weight Status:  51.8 kg - wt up 1 kg x 6 days  Estimated needs:  1200 - 1400 kcal, 58 - 68 grams protein  Nutrition Dx:  Inadequate oral intake r/t fluctuating appetite and swallowing issues AEB declining weight and decreased PO intake x at least 6 months. Ongoing.  Goal:  Pt to consume >/= 75% of meals. Unmet.  Intervention:  Continue [employer]  Breakfast PO BID  Monitor: weights, labs, PO intake  Adair La[facility] Pager #:  [phone]

## 2012-04-04 NOTE — Discharge Summary (Signed)
Physician Discharge Summary  NAME:Audrey Obrien  ZOX:[id number]  DOB: 10/24/1931   Admit date: 03/28/2012 Discharge date: 04/04/2012  Discharge Diagnoses:  Principal Problem:  *Dilantin toxicity - resolved Active Problems:  Bronchiectasis without acute exacerbation  GERD (gastroesophageal reflux disease)  UTI (lower urinary tract infection) - no UTI on this admission  Hyponatremia - back to baseline in the mid 120s  Dehydration  HTN (hypertension)  Seizure disorder - Dilantin resumed at a lower dose. Patient had been on clarithromycin which may have slowed metabolism  Scleroderma  Dysphagia  Weakness generalized - needs intensive rehabilitation for strengthening  Peripheral edema - resolved with bed rest. Has history of pulmonary hypertension   Discharge Condition: Improved  Hospital Course: Very pleasant 78 year old female with multiple medical issues including recurrent UTI, hypertension, history of scleroderma, GERD who started on seizure medications while in the ICU at [facility] several months ago after a corneal transplant. She suffered an apparent cardiac arrest and was placed on mechanical ventilation and it was at that time that she had seizures. She did not have a seizure disorder prior to this. Her husband reports that she has been more sleepy and lethargic since that hospitalization and she had been extremely ataxic as well. She was wheelchair-bound at home but had not been wheelchair bound prior to that. She has a history of low sodiums tending to run in the mid 120s  Consults: Physical therapy and occupational therapy  Disposition: Transfer to inpatient rehabilitation  Discharge Orders    Future Orders Please Complete By Expires   Diet - low sodium heart healthy      Increase activity slowly        Medication List  As of 04/04/2012  7:04 AM   STOP taking these medications         clarithromycin 500 MG tablet      NEXIUM 40 MG capsule      phenytoin 50 MG  tablet         TAKE these medications         aspirin 81 MG tablet   Take 81 mg by mouth at bedtime.      CYSTEX PO   Take 1 tablet by mouth 3 (three) times daily.      loratadine 10 MG tablet   Commonly known as: CLARITIN   Take 10 mg by mouth every evening.      magic mouthwash Soln   Take 5 mLs by mouth 4 (four) times daily -  before meals and at bedtime.      METROLOTION 0.75 % Lotn   Generic drug: METRONIDAZOLE (TOPICAL)   Apply 1 application topically 2 (two) times daily as needed. For rosecea      pantoprazole 40 MG tablet   Commonly known as: PROTONIX   Take 2 tablets (80 mg total) by mouth daily at 12 noon.      phenytoin 100 MG ER capsule   Commonly known as: DILANTIN   Take 1 capsule (100 mg total) by mouth 2 (two) times daily.      prednisoLONE acetate 1 % ophthalmic suspension   Commonly known as: PRED FORTE   Place 1 drop into the left eye 4 (four) times daily.      primidone 50 MG tablet   Commonly known as: MYSOLINE   Take 50-100 mg by mouth 2 (two) times daily. 2 tabs in the morning and 1 tab at night      propranolol ER 80 MG 24  hr capsule   Commonly known as: INDERAL LA   Take 80 mg by mouth 2 (two) times daily.      Vitamin D 1000 UNITS capsule   Take 1,000 Units by mouth daily.           Follow-up Information    Follow up with [facility] . (Home Health Physical Therapy)    Contact information:   [phone]         Things to follow up in the outpatient setting: He to follow Dilantin levels and level of consciousness and ability to ambulate  Time coordinating discharge: 25 minutes  The results of significant diagnostics from this hospitalization (including imaging, microbiology, ancillary and laboratory) are listed below for reference.    Significant Diagnostic Studies: Ct Head Wo Contrast  03/28/2012  *RADIOLOGY REPORT*  Clinical Data: Altered mental status.  Decreased level of consciousness.  Dementia.  CT HEAD WITHOUT  CONTRAST  Technique:  Contiguous axial images were obtained from the base of the skull through the vertex without contrast.  Comparison: MRI brain 08/22/2009.  Findings: The study is mildly degraded by patient motion.  Some areas were repeated.  No acute cortical infarct, hemorrhage, or mass lesion is present.  Periventricular white matter hypoattenuation is similar to the prior exam.  The ventricles are of normal size.  No significant extra-axial fluid collection is present.  IMPRESSION:  1.  Stable atrophy and mild white matter disease. 2.  No acute cortical infarct, hemorrhage, or mass lesion is present.  Original Report Authenticated By: Jamesetta Orleans. MATTERN, M.D.   Dg Swallowing Func-no Report  03/30/2012  CLINICAL DATA: dysphagia   FLUOROSCOPY FOR SWALLOWING FUNCTION STUDY:  Fluoroscopy was provided for swallowing function study, which was  administered by a speech pathologist.  Final results and recommendations  from this study are contained within the speech pathology report.      Microbiology: Recent Results (from the past 240 hour(s))  URINE CULTURE     Status: Normal   Collection Time   03/28/12  3:31 PM      Component Value Range Status Comment   Specimen Description URINE, CLEAN CATCH   Final    Special Requests NONE   Final    Culture  Setup Time [id number]   Final    Colony Count 50,000 COLONIES/ML   Final    Culture     Final    Value: Multiple bacterial morphotypes present, none predominant. Suggest appropriate recollection if clinically indicated.   Report Status 03/29/2012 FINAL   Final      Labs: Results for orders placed during the hospital encounter of 03/28/12  CBC      Component Value Range   WBC 8.7  4.0 - 10.5 (K/uL)   RBC 3.03 (*) 3.87 - 5.11 (MIL/uL)   Hemoglobin 10.0 (*) 12.0 - 15.0 (g/dL)   HCT 56.2 (*) 13.0 - 46.0 (%)   MCV 95.7  78.0 - 100.0 (fL)   MCH 33.0  26.0 - 34.0 (pg)   MCHC 34.5  30.0 - 36.0 (g/dL)   RDW 86.5  78.4 - 69.6 (%)   Platelets 344   150 - 400 (K/uL)  DIFFERENTIAL      Component Value Range   Neutrophils Relative 57  43 - 77 (%)   Neutro Abs 5.0  1.7 - 7.7 (K/uL)   Lymphocytes Relative 11 (*) 12 - 46 (%)   Lymphs Abs 1.0  0.7 - 4.0 (K/uL)   Monocytes Relative 22 (*)  3 - 12 (%)   Monocytes Absolute 1.9 (*) 0.1 - 1.0 (K/uL)   Eosinophils Relative 8 (*) 0 - 5 (%)   Eosinophils Absolute 0.7  0.0 - 0.7 (K/uL)   Basophils Relative 2 (*) 0 - 1 (%)   Basophils Absolute 0.2 (*) 0.0 - 0.1 (K/uL)  COMPREHENSIVE METABOLIC PANEL      Component Value Range   Sodium 123 (*) 135 - 145 (mEq/L)   Potassium 4.3  3.5 - 5.1 (mEq/L)   Chloride 89 (*) 96 - 112 (mEq/L)   CO2 26  19 - 32 (mEq/L)   Glucose, Bld 104 (*) 70 - 99 (mg/dL)   BUN 15  6 - 23 (mg/dL)   Creatinine, Ser 4.09  0.50 - 1.10 (mg/dL)   Calcium 8.6  8.4 - 81.1 (mg/dL)   Total Protein 5.8 (*) 6.0 - 8.3 (g/dL)   Albumin 2.9 (*) 3.5 - 5.2 (g/dL)   AST 17  0 - 37 (U/L)   ALT 10  0 - 35 (U/L)   Alkaline Phosphatase 109  39 - 117 (U/L)   Total Bilirubin 0.4  0.3 - 1.2 (mg/dL)   GFR calc non Af Amer 82 (*) >90 (mL/min)   GFR calc Af Amer >90  >90 (mL/min)  URINALYSIS, ROUTINE W REFLEX MICROSCOPIC      Component Value Range   Color, Urine YELLOW  YELLOW    APPearance CLOUDY (*) CLEAR    Specific Gravity, Urine 1.018  1.005 - 1.030    pH 7.0  5.0 - 8.0    Glucose, UA NEGATIVE  NEGATIVE (mg/dL)   Hgb urine dipstick TRACE (*) NEGATIVE    Bilirubin Urine NEGATIVE  NEGATIVE    Ketones, ur NEGATIVE  NEGATIVE (mg/dL)   Protein, ur 30 (*) NEGATIVE (mg/dL)   Urobilinogen, UA 1.0  0.0 - 1.0 (mg/dL)   Nitrite POSITIVE (*) NEGATIVE    Leukocytes, UA LARGE (*) NEGATIVE   URINE MICROSCOPIC-ADD ON      Component Value Range   Squamous Epithelial / LPF FEW (*) RARE    WBC, UA TOO NUMEROUS TO COUNT  <3 (WBC/hpf)   RBC / HPF 0-2  <3 (RBC/hpf)   Bacteria, UA MANY (*) RARE    Urine-Other MUCOUS PRESENT    URINE CULTURE      Component Value Range   Specimen Description URINE,  CLEAN CATCH     Special Requests NONE     Culture  Setup Time [id number]     Colony Count 50,000 COLONIES/ML     Culture       Value: Multiple bacterial morphotypes present, none predominant. Suggest appropriate recollection if clinically indicated.   Report Status 03/29/2012 FINAL    BASIC METABOLIC PANEL      Component Value Range   Sodium 124 (*) 135 - 145 (mEq/L)   Potassium 4.5  3.5 - 5.1 (mEq/L)   Chloride 90 (*) 96 - 112 (mEq/L)   CO2 26  19 - 32 (mEq/L)   Glucose, Bld 128 (*) 70 - 99 (mg/dL)   BUN 13  6 - 23 (mg/dL)   Creatinine, Ser 0.86  0.50 - 1.10 (mg/dL)   Calcium 8.5  8.4 - 57.8 (mg/dL)   GFR calc non Af Amer 85 (*) >90 (mL/min)   GFR calc Af Amer >90  >90 (mL/min)  OSMOLALITY      Component Value Range   Osmolality 256 (*) 275 - 300 (mOsm/kg)  OSMOLALITY, URINE      Component  Value Range   Osmolality, Ur 465  390 - 1090 (mOsm/kg)  SODIUM, URINE, RANDOM      Component Value Range   Sodium, Ur 112    TSH      Component Value Range   TSH 2.839  0.350 - 4.500 (uIU/mL)  CBC      Component Value Range   WBC 6.3  4.0 - 10.5 (K/uL)   RBC 2.96 (*) 3.87 - 5.11 (MIL/uL)   Hemoglobin 9.9 (*) 12.0 - 15.0 (g/dL)   HCT 16.1 (*) 09.6 - 46.0 (%)   MCV 95.9  78.0 - 100.0 (fL)   MCH 33.4  26.0 - 34.0 (pg)   MCHC 34.9  30.0 - 36.0 (g/dL)   RDW 04.5  40.9 - 81.1 (%)   Platelets 363  150 - 400 (K/uL)  CBC      Component Value Range   WBC 7.4  4.0 - 10.5 (K/uL)   RBC 2.96 (*) 3.87 - 5.11 (MIL/uL)   Hemoglobin 9.9 (*) 12.0 - 15.0 (g/dL)   HCT 91.4 (*) 78.2 - 46.0 (%)   MCV 94.9  78.0 - 100.0 (fL)   MCH 33.4  26.0 - 34.0 (pg)   MCHC 35.2  30.0 - 36.0 (g/dL)   RDW 95.6  21.3 - 08.6 (%)   Platelets 315  150 - 400 (K/uL)  COMPREHENSIVE METABOLIC PANEL      Component Value Range   Sodium 124 (*) 135 - 145 (mEq/L)   Potassium 4.3  3.5 - 5.1 (mEq/L)   Chloride 91 (*) 96 - 112 (mEq/L)   CO2 26  19 - 32 (mEq/L)   Glucose, Bld 100 (*) 70 - 99 (mg/dL)   BUN 16  6 - 23  (mg/dL)   Creatinine, Ser 5.78  0.50 - 1.10 (mg/dL)   Calcium 8.3 (*) 8.4 - 10.5 (mg/dL)   Total Protein 5.6 (*) 6.0 - 8.3 (g/dL)   Albumin 2.8 (*) 3.5 - 5.2 (g/dL)   AST 18  0 - 37 (U/L)   ALT 9  0 - 35 (U/L)   Alkaline Phosphatase 101  39 - 117 (U/L)   Total Bilirubin 0.3  0.3 - 1.2 (mg/dL)   GFR calc non Af Amer 80 (*) >90 (mL/min)   GFR calc Af Amer >90  >90 (mL/min)  PHENYTOIN LEVEL, TOTAL      Component Value Range   Phenytoin Lvl 32.3 (*) 10.0 - 20.0 (ug/mL)  AMMONIA      Component Value Range   Ammonia 23  11 - 60 (umol/L)  VITAMIN B12      Component Value Range   Vitamin B-12 447  211 - 911 (pg/mL)  TSH      Component Value Range   TSH 3.330  0.350 - 4.500 (uIU/mL)  PHENOBARBITAL LEVEL      Component Value Range   Phenobarbital 7.8 (*) 15.0 - 40.0 (ug/mL)  PHENYTOIN LEVEL, TOTAL      Component Value Range   Phenytoin Lvl 38.1 (*) 10.0 - 20.0 (ug/mL)  BASIC METABOLIC PANEL      Component Value Range   Sodium 120 (*) 135 - 145 (mEq/L)   Potassium 3.6  3.5 - 5.1 (mEq/L)   Chloride 86 (*) 96 - 112 (mEq/L)   CO2 25  19 - 32 (mEq/L)   Glucose, Bld 124 (*) 70 - 99 (mg/dL)   BUN 12  6 - 23 (mg/dL)   Creatinine, Ser 4.69  0.50 - 1.10 (mg/dL)   Calcium  8.1 (*) 8.4 - 10.5 (mg/dL)   GFR calc non Af Amer 85 (*) >90 (mL/min)   GFR calc Af Amer >90  >90 (mL/min)  PHENYTOIN LEVEL, TOTAL      Component Value Range   Phenytoin Lvl 23.7 (*) 10.0 - 20.0 (ug/mL)  CBC      Component Value Range   WBC 7.8  4.0 - 10.5 (K/uL)   RBC 2.76 (*) 3.87 - 5.11 (MIL/uL)   Hemoglobin 9.3 (*) 12.0 - 15.0 (g/dL)   HCT 16.1 (*) 09.6 - 46.0 (%)   MCV 93.8  78.0 - 100.0 (fL)   MCH 33.7  26.0 - 34.0 (pg)   MCHC 35.9  30.0 - 36.0 (g/dL)   RDW 04.5  40.9 - 81.1 (%)   Platelets 277  150 - 400 (K/uL)  DIFFERENTIAL      Component Value Range   Neutrophils Relative 44  43 - 77 (%)   Neutro Abs 3.4  1.7 - 7.7 (K/uL)   Lymphocytes Relative 20  12 - 46 (%)   Lymphs Abs 1.6  0.7 - 4.0 (K/uL)    Monocytes Relative 30 (*) 3 - 12 (%)   Monocytes Absolute 2.3 (*) 0.1 - 1.0 (K/uL)   Eosinophils Relative 4  0 - 5 (%)   Eosinophils Absolute 0.3  0.0 - 0.7 (K/uL)   Basophils Relative 2 (*) 0 - 1 (%)   Basophils Absolute 0.1  0.0 - 0.1 (K/uL)  BASIC METABOLIC PANEL      Component Value Range   Sodium 122 (*) 135 - 145 (mEq/L)   Potassium 4.4  3.5 - 5.1 (mEq/L)   Chloride 91 (*) 96 - 112 (mEq/L)   CO2 24  19 - 32 (mEq/L)   Glucose, Bld 86  70 - 99 (mg/dL)   BUN 15  6 - 23 (mg/dL)   Creatinine, Ser 9.14  0.50 - 1.10 (mg/dL)   Calcium 8.0 (*) 8.4 - 10.5 (mg/dL)   GFR calc non Af Amer 80 (*) >90 (mL/min)   GFR calc Af Amer >90  >90 (mL/min)  PHENYTOIN LEVEL, TOTAL      Component Value Range   Phenytoin Lvl 17.3  10.0 - 20.0 (ug/mL)  BASIC METABOLIC PANEL      Component Value Range   Sodium 125 (*) 135 - 145 (mEq/L)   Potassium 4.3  3.5 - 5.1 (mEq/L)   Chloride 95 (*) 96 - 112 (mEq/L)   CO2 23  19 - 32 (mEq/L)   Glucose, Bld 86  70 - 99 (mg/dL)   BUN 13  6 - 23 (mg/dL)   Creatinine, Ser 7.82  0.50 - 1.10 (mg/dL)   Calcium 8.0 (*) 8.4 - 10.5 (mg/dL)   GFR calc non Af Amer 87 (*) >90 (mL/min)   GFR calc Af Amer >90  >90 (mL/min)  PHENYTOIN LEVEL, TOTAL      Component Value Range   Phenytoin Lvl 12.5  10.0 - 20.0 (ug/mL)  BASIC METABOLIC PANEL      Component Value Range   Sodium 125 (*) 135 - 145 (mEq/L)   Potassium 3.7  3.5 - 5.1 (mEq/L)   Chloride 92 (*) 96 - 112 (mEq/L)   CO2 24  19 - 32 (mEq/L)   Glucose, Bld 87  70 - 99 (mg/dL)   BUN 10  6 - 23 (mg/dL)   Creatinine, Ser 9.56  0.50 - 1.10 (mg/dL)   Calcium 8.2 (*) 8.4 - 10.5 (mg/dL)   GFR calc non  Af Amer 89 (*) >90 (mL/min)   GFR calc Af Amer >90  >90 (mL/min)  CBC      Component Value Range   WBC 5.5  4.0 - 10.5 (K/uL)   RBC 3.08 (*) 3.87 - 5.11 (MIL/uL)   Hemoglobin 10.1 (*) 12.0 - 15.0 (g/dL)   HCT 40.9 (*) 81.1 - 46.0 (%)   MCV 93.8  78.0 - 100.0 (fL)   MCH 32.8  26.0 - 34.0 (pg)   MCHC 34.9  30.0 - 36.0  (g/dL)   RDW 91.4 (*) 78.2 - 15.5 (%)   Platelets 290  150 - 400 (K/uL)  PHENYTOIN LEVEL, TOTAL      Component Value Range   Phenytoin Lvl 5.8 (*) 10.0 - 20.0 (ug/mL)  BASIC METABOLIC PANEL      Component Value Range   Sodium 126 (*) 135 - 145 (mEq/L)   Potassium 3.9  3.5 - 5.1 (mEq/L)   Chloride 94 (*) 96 - 112 (mEq/L)   CO2 23  19 - 32 (mEq/L)   Glucose, Bld 84  70 - 99 (mg/dL)   BUN 12  6 - 23 (mg/dL)   Creatinine, Ser 9.56  0.50 - 1.10 (mg/dL)   Calcium 8.3 (*) 8.4 - 10.5 (mg/dL)   GFR calc non Af Amer 81 (*) >90 (mL/min)   GFR calc Af Amer >90  >90 (mL/min)     Signed: Eduard Penkala NEVILL 04/04/2012, 7:04 AM

## 2012-04-04 NOTE — H&P (Signed)
Physical Medicine and Rehabilitation Admission H&P  Chief Complaint   Patient presents with   .  Abdominal Pain   :  HPI: 78 year old right-handed female admitted April 23 with altered mental status. Patient with noted urinary tract symptoms and dysuria for the past 3 weeks with urine culture negative. Per patient's husband, she was recently admitted to [facility] for corneal transplant 3 months ago where she had a difficult hospitalization, she had coded and was unresponsive placed on mechanical ventilation. She had 3 seizures in the ICU and followed by neurology placed on Dilantin and primidone and later discharged to home. She had no further seizures but her husband noted increased lethargy at home.The patient has a prior history of tremor and has seen neurology in the past. Her husband states she was never diagnosed with Parkinson's disease although he suspects it. She has not seen Dr. Sandria Manly for quite some time. The patient's husband states that the patient has really not regained her usual level of functioning since her ICU hospitalization earlier this year.Noted Dilantin level of 32.3. Cranial CT scan showed no acute changes. Echocardiogram ejection fraction of 60% without emboli. Venous Dopplers with no evidence of deep vein thrombosis. Subcutaneous Lovenox as been initiated for DVT prophylaxis. A followup urine culture showed 50,000 multi-bacterial. Her Dilantin was held and later resumed 04/03/2012 200 mg daily with latest Dilantin level 6.0 04/04/2012. She is currently on dysphagia 2 thin liquid diet. Physical therapy ongoing recommendations of physical medicine rehabilitation consult to consider inpatient rehabilitation services  Review of Systems  Constitutional: Positive for malaise/fatigue.  Eyes: Positive for blurred vision.  Gastrointestinal: Positive for constipation.  Neurological: Positive for seizures.  All other systems reviewed and are negative  Past Medical History     Diagnosis  Date   .  Hypertension    .  Acne rosacea    .  Chronic rhinitis      no benefit from antihistamines   .  Gallbladder & bile duct stone  2007     1.6 cm GB stone; no symptoms   .  Pneumonia  2007   .  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  C. difficile colitis  11/07   .  Spastic dysphonia      botox injs at [facility]   .  Hypertension    .  Scleroderma      probable, ANA+ 1:160, anticentromers strongly pos, USRNP-, TH/TO-,RF equivocal, CCP-, Skin puffiness of hands, telangectasias of hands, raynauds, ECHO increased pulmonary pressures 8/11, pfts stable   .  S/P right heart catheterization  10/11   .  Benign essential tremor    .  Osteoporosis      started alendronate 6/12    Past Surgical History   Procedure  Date   .  Lung surgery  1982   .  Corneal transplant     Family History   Problem  Relation  Age of Onset   .  Asthma  Son     Social History: reports that she has never smoked. She has never used smokeless tobacco. She reports that she does not drink alcohol or use illicit drugs.  Allergies: No Known Allergies  Medications Prior to Admission   Medication  Sig  Dispense  Refill   .  aspirin 81 MG tablet  Take 81 mg by mouth at bedtime.     .  Cholecalciferol (VITAMIN D) 1000 UNITS capsule  Take 1,000 Units by mouth daily.     Marland Kitchen  loratadine (CLARITIN) 10 MG tablet  Take 10 mg by mouth every evening.     .  Methenamine-Sodium Salicylate (CYSTEX PO)  Take 1 tablet by mouth 3 (three) times daily.     Marland Kitchen  METRONIDAZOLE, TOPICAL, (METROLOTION) 0.75 % LOTN  Apply 1 application topically 2 (two) times daily as needed. For rosecea     .  prednisoLONE acetate (PRED FORTE) 1 % ophthalmic suspension  Place 1 drop into the left eye 4 (four) times daily.     .  primidone (MYSOLINE) 50 MG tablet  Take 50-100 mg by mouth 2 (two) times daily. 2 tabs in the morning and 1 tab at night     .  propranolol (INDERAL LA) 80 MG 24 hr capsule  Take 80 mg  by mouth 2 (two) times daily.     Marland Kitchen  DISCONTD: clarithromycin (BIAXIN) 500 MG tablet  Take 500 mg by mouth 2 (two) times daily.     Marland Kitchen  DISCONTD: esomeprazole (NEXIUM) 40 MG capsule  Take 40 mg by mouth daily.     Marland Kitchen  DISCONTD: phenytoin (DILANTIN) 50 MG tablet  Chew 100 mg by mouth 3 (three) times daily.      Home:  Home Living  Lives With: Spouse  Available Help at Discharge: Available 24 hours/day;Family  Type of Home: House  Home Access: Stairs to enter  [employer] of Steps: 2  Entrance Stairs-Rails: None  Home Layout: Two level;Full bath on main level;Able to live on main level with bedroom/bathroom  Bathroom Shower/Tub: Walk-in shower;Door  [employer] Toilet: Handicapped height  Bathroom Accessibility: Yes  How Accessible: Accessible via wheelchair  Home Adaptive Equipment: Wheelchair - manual;Walker - rolling;Shower chair without back;Bedside commode/3-in-1  Functional History:  Prior Function  Able to Take Stairs?: Yes  Driving: No  Vocation: Retired  Functional Status:  Mobility:  Bed Mobility  Bed Mobility: Rolling Right;Right Sidelying to Sit  Rolling Right: 3: Mod assist  Right Sidelying to Sit: 1: +2 Total assist;HOB elevated;With rails  Right Sidelying to Sit: Patient Percentage: 40%  Transfers  Transfers: Sit to Stand;Stand to Sit  Sit to Stand: 3: Mod assist;With armrests;From chair/3-in-1  Sit to Stand: Patient Percentage: 40%  Stand to Sit: 3: Mod assist;To chair/3-in-1  Stand to Sit: Patient Percentage: 40%  Ambulation/Gait  Ambulation/Gait Assistance: 1: +2 Total assist  Ambulation/Gait: Patient Percentage: 50  Ambulation Distance (Feet): 80 Feet (X2)  Assistive device: Rolling walker;2 person hand held assist  Ambulation/Gait Assistance Details: Noted RW seems to get in the way with progressive ambulation, and husband stated a threapist (presumably HHPT) recommended pt not to use it -- we used 2 person handheld assist to tap into smoother gait  pattern; With and without RW, noted significant posterior lean during amb, putting pt at high fall risk  Gait Pattern: Step-through pattern;Shuffle   ADL:   Cognition:  Cognition  Arousal/Alertness: Awake/alert  Orientation Level: Oriented to person;Oriented to place;Disoriented to time;Disoriented to situation  Cognition  Overall Cognitive Status: Appears within functional limits for tasks assessed/performed  Difficult to assess due to: Level of arousal  Arousal/Alertness: Awake/alert  Orientation Level: Appears intact for tasks assessed  Behavior During Session: [facility] for tasks performed  Blood pressure 126/71, pulse 66, temperature 97.9 F (36.6 C), temperature source Oral, resp. rate 18, height 5\' 2"  (1.575 m), weight 51.8 kg (114 lb 3.2 oz), SpO2 90.00%.  Physical Exam  Vitals reviewed.  HENT:  Head: Normocephalic.  Eyes: Prosthetic right eye. Left eye tracks well. Does have double vision with right ward gaze. Mild left eye ptosis  Neck: Neck supple. No thyromegaly present.  Cardiovascular: Regular rhythm. Regular rate.  Pulmonary/Chest: Breath sounds normal. She has no wheezes, rales or rhonchi. She's in no distress.  Abdominal: She exhibits no distension. There is no tenderness.  Musculoskeletal: She exhibits no edema.  Neurological: She is alert.  Speech is a bit halting but fully intelligible. She is alert and oriented x3 and follows commands.  Skin: Skin is warm and dry.  Psychiatric: She has a normal mood and affect.  general: Mask facies, microstomia, xerostomia  Motor strength is 4/5 in bilateral deltoid, biceps, triceps, grip  3 minus/5 in the hip flexors, knee extensors ankle dorsiflexors.  Tone there is lead pipe type rigidity in the biceps. There is decreased finger to thumb opposition bilaterally. There is a resting tremor in bilateral upper extremities, almost a pill rolling tremor--continued, to amplified tremor with intentional movement.  There is a lingual  tremor as well.  Sensation is intact to light touch  No evidence of peripheral edema  No evidence of joint swelling  Results for orders placed during the hospital encounter of 03/28/12 (from the past 48 hour(s))   PHENYTOIN LEVEL, TOTAL Status: Abnormal    Collection Time    04/03/12 5:55 AM   Component  Value  Range  Comment    Phenytoin Lvl  5.8 (*)  10.0 - 20.0 (ug/mL)    BASIC METABOLIC PANEL Status: Abnormal    Collection Time    04/03/12 5:55 AM   Component  Value  Range  Comment    Sodium  126 (*)  135 - 145 (mEq/L)     Potassium  3.9  3.5 - 5.1 (mEq/L)     Chloride  94 (*)  96 - 112 (mEq/L)     CO2  23  19 - 32 (mEq/L)     Glucose, Bld  84  70 - 99 (mg/dL)     BUN  12  6 - 23 (mg/dL)     Creatinine, Ser  9.60  0.50 - 1.10 (mg/dL)     Calcium  8.3 (*)  8.4 - 10.5 (mg/dL)     GFR calc non Af Amer  81 (*)  >90 (mL/min)     GFR calc Af Amer  >90  >90 (mL/min)    CREATININE, SERUM Status: Abnormal    Collection Time    04/04/12 5:55 AM   Component  Value  Range  Comment    Creatinine, Ser  0.67  0.50 - 1.10 (mg/dL)     GFR calc non Af Amer  81 (*)  >90 (mL/min)     GFR calc Af Amer  >90  >90 (mL/min)    PHENYTOIN LEVEL, TOTAL Status: Abnormal    Collection Time    04/04/12 5:55 AM   Component  Value  Range  Comment    Phenytoin Lvl  6.0 (*)  10.0 - 20.0 (ug/mL)     No results found.  Post Admission Physician Evaluation:  1. Functional deficits secondary to altered mental status, gait disorder, weakness related to seizures and multiple medical. ?Parkinson's disease 2. Patient is admitted to receive collaborative, interdisciplinary care between the physiatrist, rehab nursing staff, and therapy team. 3. Patient's level of medical complexity and substantial therapy needs in context of that medical necessity cannot be provided at a lesser intensity of care such  as a SNF. 4. Patient has experienced substantial functional loss from his/her baseline which was documented above under  the "Functional History" and "Functional Status" headings. Judging by the patient's diagnosis, physical exam, and functional history, the patient has potential for functional progress which will result in measurable gains while on inpatient rehab. These gains will be of substantial and practical use upon discharge in facilitating mobility and self-care at the household level. 5. Physiatrist will provide 24 hour management of medical needs as well as oversight of the therapy plan/treatment and provide guidance as appropriate regarding the interaction of the two. 6. 24 hour rehab nursing will assist with bladder management, bowel management, safety, skin/wound care, disease management, medication administration and patient education and help integrate therapy concepts, techniques,education, etc. 7. PT will assess and treat for: LES, balance, coordination, safety, NMR, adaptive equipment, family ed. Goals are: minimal plus assist. 8. OT will assess and treat for: UES, ADL's, fxnl mobility, NMR, education, coordination, CPT. Goals are: min to moderate assist. 9. SLP will assess and treat for: speech intelligibility, cognition, swallowing. Goals are: supervision to min assist. 10. Case Management and Social Worker will assess and treat for psychological issues and discharge planning. 11. Team conference will be held weekly to assess progress toward goals and to determine barriers to discharge. 12. Patient will receive at least 3 hours of therapy per day at least 5 days per week. 13. ELOS and Prognosis: 3 weeks good Medical Problem List and Plan:  1. Deconditioning/hypoxic encephalopathy/Dilantin toxicity  2. DVT Prophylaxis/Anticoagulation: Subcutaneous Lovenox. Monitor platelet counts any signs of bleeding  3. seizure disorder. Dilantin resumed 100 mg twice a day. Continue primidone 100 mg daily and 50 mg at bedtime. Dilantin level 6.0 04/04/2012  4. Benign essential tremor. Inderal 80 mg twice a day.  Patient has seen Dr. Sandria Manly in the past. Exam consistent with parkinson's disease.  5. History of corneal transplant 3 months ago at [facility].  6. Hyponatremia. 120-126. Followup labs. Appears to be chronic with noted sodium level 128 09/03/2011  Ivory Broad, MD  04/04/2012

## 2012-04-04 NOTE — H&P (Signed)
Physical Medicine and Rehabilitation Admission H&P  Chief Complaint   Patient presents with   .  Abdominal Pain   :  HPI: 78-year-old right-handed female admitted April 23 with altered mental status. Patient with noted urinary tract symptoms and dysuria for the past 3 weeks with urine culture negative. Per patient's husband, she was recently admitted to [facility] for corneal transplant 3 months ago where she had a difficult hospitalization, she had coded and was unresponsive placed on mechanical ventilation. She had 3 seizures in the ICU and followed by neurology placed on Dilantin and primidone and later discharged to home. She had no further seizures but her husband noted increased lethargy at home.The patient has a prior history of tremor and has seen neurology in the past. Her husband states she was never diagnosed with Parkinson's disease although he suspects it. She has not seen Dr. Love for quite some time. The patient's husband states that the patient has really not regained her usual level of functioning since her ICU hospitalization earlier this year.Noted Dilantin level of 32.3. Cranial CT scan showed no acute changes. Echocardiogram ejection fraction of 60% without emboli. Venous Dopplers with no evidence of deep vein thrombosis. Subcutaneous Lovenox as been initiated for DVT prophylaxis. A followup urine culture showed 50,000 multi-bacterial. Her Dilantin was held and later resumed 04/03/2012 200 mg daily with latest Dilantin level 6.0 04/04/2012. She is currently on dysphagia 2 thin liquid diet. Physical therapy ongoing recommendations of physical medicine rehabilitation consult to consider inpatient rehabilitation services  Review of Systems  Constitutional: Positive for malaise/fatigue.  Eyes: Positive for blurred vision.  Gastrointestinal: Positive for constipation.  Neurological: Positive for seizures.  All other systems reviewed and are negative  Past Medical History     Diagnosis  Date   .  Hypertension    .  Acne rosacea    .  Chronic rhinitis      no benefit from antihistamines   .  Gallbladder & bile duct stone  2007     1.6 cm GB stone; no symptoms   .  Pneumonia  2007   .  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  C. difficile colitis  11/07   .  Spastic dysphonia      botox injs at [facility]   .  Hypertension    .  Scleroderma      probable, ANA+ 1:160, anticentromers strongly pos, USRNP-, TH/TO-,RF equivocal, CCP-, Skin puffiness of hands, telangectasias of hands, raynauds, ECHO increased pulmonary pressures 8/11, pfts stable   .  S/P right heart catheterization  10/11   .  Benign essential tremor    .  Osteoporosis      started alendronate 6/12    Past Surgical History   Procedure  Date   .  Lung surgery  1982   .  Corneal transplant     Family History   Problem  Relation  Age of Onset   .  Asthma  Son     Social History: reports that she has never smoked. She has never used smokeless tobacco. She reports that she does not drink alcohol or use illicit drugs.  Allergies: No Known Allergies  Medications Prior to Admission   Medication  Sig  Dispense  Refill   .  aspirin 81 MG tablet  Take 81 mg by mouth at bedtime.     .  Cholecalciferol (VITAMIN D) 1000 UNITS capsule    Take 1,000 Units by mouth daily.     .  loratadine (CLARITIN) 10 MG tablet  Take 10 mg by mouth every evening.     .  Methenamine-Sodium Salicylate (CYSTEX PO)  Take 1 tablet by mouth 3 (three) times daily.     .  METRONIDAZOLE, TOPICAL, (METROLOTION) 0.75 % LOTN  Apply 1 application topically 2 (two) times daily as needed. For rosecea     .  prednisoLONE acetate (PRED FORTE) 1 % ophthalmic suspension  Place 1 drop into the left eye 4 (four) times daily.     .  primidone (MYSOLINE) 50 MG tablet  Take 50-100 mg by mouth 2 (two) times daily. 2 tabs in the morning and 1 tab at night     .  propranolol (INDERAL LA) 80 MG 24 hr capsule  Take 80 mg  by mouth 2 (two) times daily.     .  DISCONTD: clarithromycin (BIAXIN) 500 MG tablet  Take 500 mg by mouth 2 (two) times daily.     .  DISCONTD: esomeprazole (NEXIUM) 40 MG capsule  Take 40 mg by mouth daily.     .  DISCONTD: phenytoin (DILANTIN) 50 MG tablet  Chew 100 mg by mouth 3 (three) times daily.      Home:  Home Living  Lives With: Spouse  Available Help at Discharge: Available 24 hours/day;Family  Type of Home: House  Home Access: Stairs to enter  Entrance Stairs-Number of Steps: 2  Entrance Stairs-Rails: None  Home Layout: Two level;Full bath on main level;Able to live on main level with bedroom/bathroom  Bathroom Shower/Tub: Walk-in shower;Door  Bathroom Toilet: Handicapped height  Bathroom Accessibility: Yes  How Accessible: Accessible via wheelchair  Home Adaptive Equipment: Wheelchair - manual;Walker - rolling;Shower chair without back;Bedside commode/3-in-1  Functional History:  Prior Function  Able to Take Stairs?: Yes  Driving: No  Vocation: Retired  Functional Status:  Mobility:  Bed Mobility  Bed Mobility: Rolling Right;Right Sidelying to Sit  Rolling Right: 3: Mod assist  Right Sidelying to Sit: 1: +2 Total assist;HOB elevated;With rails  Right Sidelying to Sit: Patient Percentage: 40%  Transfers  Transfers: Sit to Stand;Stand to Sit  Sit to Stand: 3: Mod assist;With armrests;From chair/3-in-1  Sit to Stand: Patient Percentage: 40%  Stand to Sit: 3: Mod assist;To chair/3-in-1  Stand to Sit: Patient Percentage: 40%  Ambulation/Gait  Ambulation/Gait Assistance: 1: +2 Total assist  Ambulation/Gait: Patient Percentage: 50  Ambulation Distance (Feet): 80 Feet (X2)  Assistive device: Rolling walker;2 person hand held assist  Ambulation/Gait Assistance Details: Noted RW seems to get in the way with progressive ambulation, and husband stated a threapist (presumably HHPT) recommended pt not to use it -- we used 2 person handheld assist to tap into smoother gait  pattern; With and without RW, noted significant posterior lean during amb, putting pt at high fall risk  Gait Pattern: Step-through pattern;Shuffle   ADL:   Cognition:  Cognition  Arousal/Alertness: Awake/alert  Orientation Level: Oriented to person;Oriented to place;Disoriented to time;Disoriented to situation  Cognition  Overall Cognitive Status: Appears within functional limits for tasks assessed/performed  Difficult to assess due to: Level of arousal  Arousal/Alertness: Awake/alert  Orientation Level: Appears intact for tasks assessed  Behavior During Session: WFL for tasks performed  Blood pressure 126/71, pulse 66, temperature 97.9 F (36.6 C), temperature source Oral, resp. rate 18, height 5' 2" (1.575 m), weight 51.8 kg (114 lb 3.2 oz), SpO2 90.00%.  Physical Exam  Vitals reviewed.    HENT:  Head: Normocephalic.  Eyes: Prosthetic right eye. Left eye tracks well. Does have double vision with right ward gaze. Mild left eye ptosis  Neck: Neck supple. No thyromegaly present.  Cardiovascular: Regular rhythm. Regular rate.  Pulmonary/Chest: Breath sounds normal. She has no wheezes, rales or rhonchi. She's in no distress.  Abdominal: She exhibits no distension. There is no tenderness.  Musculoskeletal: She exhibits no edema.  Neurological: She is alert.  Speech is a bit halting but fully intelligible. She is alert and oriented x3 and follows commands.  Skin: Skin is warm and dry.  Psychiatric: She has a normal mood and affect.  general: Mask facies, microstomia, xerostomia  Motor strength is 4/5 in bilateral deltoid, biceps, triceps, grip  3 minus/5 in the hip flexors, knee extensors ankle dorsiflexors.  Tone there is lead pipe type rigidity in the biceps. There is decreased finger to thumb opposition bilaterally. There is a resting tremor in bilateral upper extremities, almost a pill rolling tremor--continued, to amplified tremor with intentional movement.  There is a lingual  tremor as well.  Sensation is intact to light touch  No evidence of peripheral edema  No evidence of joint swelling  Results for orders placed during the hospital encounter of 03/28/12 (from the past 48 hour(s))   PHENYTOIN LEVEL, TOTAL Status: Abnormal    Collection Time    04/03/12 5:55 AM   Component  Value  Range  Comment    Phenytoin Lvl  5.8 (*)  10.0 - 20.0 (ug/mL)    BASIC METABOLIC PANEL Status: Abnormal    Collection Time    04/03/12 5:55 AM   Component  Value  Range  Comment    Sodium  126 (*)  135 - 145 (mEq/L)     Potassium  3.9  3.5 - 5.1 (mEq/L)     Chloride  94 (*)  96 - 112 (mEq/L)     CO2  23  19 - 32 (mEq/L)     Glucose, Bld  84  70 - 99 (mg/dL)     BUN  12  6 - 23 (mg/dL)     Creatinine, Ser  0.67  0.50 - 1.10 (mg/dL)     Calcium  8.3 (*)  8.4 - 10.5 (mg/dL)     GFR calc non Af Amer  81 (*)  >90 (mL/min)     GFR calc Af Amer  >90  >90 (mL/min)    CREATININE, SERUM Status: Abnormal    Collection Time    04/04/12 5:55 AM   Component  Value  Range  Comment    Creatinine, Ser  0.67  0.50 - 1.10 (mg/dL)     GFR calc non Af Amer  81 (*)  >90 (mL/min)     GFR calc Af Amer  >90  >90 (mL/min)    PHENYTOIN LEVEL, TOTAL Status: Abnormal    Collection Time    04/04/12 5:55 AM   Component  Value  Range  Comment    Phenytoin Lvl  6.0 (*)  10.0 - 20.0 (ug/mL)     No results found.  Post Admission Physician Evaluation:  1. Functional deficits secondary to altered mental status, gait disorder, weakness related to seizures and multiple medical. ?Parkinson's disease 2. Patient is admitted to receive collaborative, interdisciplinary care between the physiatrist, rehab nursing staff, and therapy team. 3. Patient's level of medical complexity and substantial therapy needs in context of that medical necessity cannot be provided at a lesser intensity of care such   as a SNF. 4. Patient has experienced substantial functional loss from his/her baseline which was documented above under  the "Functional History" and "Functional Status" headings. Judging by the patient's diagnosis, physical exam, and functional history, the patient has potential for functional progress which will result in measurable gains while on inpatient rehab. These gains will be of substantial and practical use upon discharge in facilitating mobility and self-care at the household level. 5. Physiatrist will provide 24 hour management of medical needs as well as oversight of the therapy plan/treatment and provide guidance as appropriate regarding the interaction of the two. 6. 24 hour rehab nursing will assist with bladder management, bowel management, safety, skin/wound care, disease management, medication administration and patient education and help integrate therapy concepts, techniques,education, etc. 7. PT will assess and treat for: LES, balance, coordination, safety, NMR, adaptive equipment, family ed. Goals are: minimal plus assist. 8. OT will assess and treat for: UES, ADL's, fxnl mobility, NMR, education, coordination, CPT. Goals are: min to moderate assist. 9. SLP will assess and treat for: speech intelligibility, cognition, swallowing. Goals are: supervision to min assist. 10. Case Management and Social Worker will assess and treat for psychological issues and discharge planning. 11. Team conference will be held weekly to assess progress toward goals and to determine barriers to discharge. 12. Patient will receive at least 3 hours of therapy per day at least 5 days per week. 13. ELOS and Prognosis: 3 weeks good Medical Problem List and Plan:  1. Deconditioning/hypoxic encephalopathy/Dilantin toxicity  2. DVT Prophylaxis/Anticoagulation: Subcutaneous Lovenox. Monitor platelet counts any signs of bleeding  3. seizure disorder. Dilantin resumed 100 mg twice a day. Continue primidone 100 mg daily and 50 mg at bedtime. Dilantin level 6.0 04/04/2012  4. Benign essential tremor. Inderal 80 mg twice a day.  Patient has seen Dr. Love in the past. Exam consistent with parkinson's disease.  5. History of corneal transplant 3 months ago at [facility].  6. Hyponatremia. 120-126. Followup labs. Appears to be chronic with noted sodium level 128 09/03/2011  Zach Arlyss Weathersby, MD  04/04/2012  

## 2012-04-04 NOTE — Progress Notes (Signed)
Physical Therapy Treatment Patient Details Name: CALE DECAROLIS MRN: [id number] DOB: July 13, 1931 Today's Date: 04/04/2012 Time: 6440-3474 PT Time Calculation (min): 21 min  PT Assessment / Plan / Recommendation Comments on Treatment Session  For dc to Rehab today; Discussed pt's movement restrictions with Ottie Glazier, Rehab Admissions Coordinator    Follow Up Recommendations  Inpatient Rehab    Equipment Recommendations  Defer to next venue    Frequency Min 3X/week   Plan Discharge plan remains appropriate    Precautions / Restrictions Precautions Precautions: Fall Other Brace/Splint: Eye patch L when supine, lifting, or bending over (recent corneal implant surgery) Restrictions Weight Bearing Restrictions: No Other Position/Activity Restrictions: eye patch to Left eye when pt. supine or bending or lifting   Pertinent Vitals/Pain no apparent distress     Mobility  Transfers Transfers: Sit to Stand;Stand to Sit Sit to Stand: 3: Mod assist;With armrests;From chair/3-in-1 Stand to Sit: 3: Mod assist;To chair/3-in-1 Details for Transfer Assistance: Cues for hand placement and safe technique; worked on intitiating from an anterior weight shift Ambulation/Gait Ambulation/Gait Assistance: 3: Mod assist Ambulation Distance (Feet): 100 Feet Assistive device: Rolling walker Ambulation/Gait Assistance Details: Lowered RW in hopes that it will help pt to keep weight forward over RW; verbal and tactile cues to smooth out gait pattern and lengthen steps Gait Pattern: Decreased step length - right;Decreased step length - left    Exercises     PT Goals Acute Rehab PT Goals Time For Goal Achievement: 04/13/12 Potential to Achieve Goals: Good Pt will go Sit to Stand: with min assist PT Goal: Sit to Stand - Progress: Progressing toward goal Pt will go Stand to Sit: with min assist PT Goal: Stand to Sit - Progress: Progressing toward goal Pt will Ambulate: 51 - 150 feet;with min  assist;with least restrictive assistive device PT Goal: Ambulate - Progress: Progressing toward goal  Visit Information  Last PT Received On: 04/04/12 Assistance Needed: +1    Subjective Data  Subjective: Pt and husband seem happy re: going to rehab today   Cognition  Overall Cognitive Status: Appears within functional limits for tasks assessed/performed Arousal/Alertness: Awake/alert Orientation Level: Appears intact for tasks assessed Behavior During Session: [facility] for tasks performed    Balance     End of Session PT - End of Session Equipment Utilized During Treatment: Gait belt Activity Tolerance: Patient tolerated treatment well Patient left: in chair;with call bell/phone within reach;with family/visitor present Nurse Communication: Mobility status    Olen Pel [place], [place] [phone]  04/04/2012, 1:12 PM

## 2012-04-04 NOTE — Progress Notes (Signed)
Pt discharged to inpatient rehab. Assessment unchanged from morning. No barriers to discharge noted. IV removed. Tele removed.

## 2012-04-04 NOTE — Progress Notes (Signed)
We will plan admit to inpt rehab today. [phone] with questions.

## 2012-04-04 NOTE — Progress Notes (Signed)
Late entry . Patient received from 6700 at 1610 . Oriented patient and husband to room and call system . Soft call bell obtained due to patient with decrease vision . Buttocks excoriated and red. Turned patient to side lying position . Patient and husband verbalized understanding of admission process. Continue with plan of care.    Cleotilde Neer

## 2012-04-04 NOTE — Progress Notes (Signed)
Overall Plan of Care [facility]) Patient Details Name: Audrey Obrien MRN: [id number] DOB: [birth year]  Diagnosis:  Deconditioning, seizure disorder, respiratory failiure  Primary Diagnosis:    Physical deconditioning Co-morbidities: chronic tremor, gait disorder (? PD), recent corneal transplant  Functional Problem List  Patient demonstrates impairments in the following areas: Balance, Cognition, Endurance, Medication Management, Nutrition, Perception and Vision  Basic ADL's: eating, grooming, bathing, dressing and toileting Advanced ADL's:   Transfers:  toilet and tub/shower Locomotion:  ambulation  Additional Impairments:  Swallowing, Communication  expression, Social Cognition   problem solving, memory, attention and awareness and Other  Anticipated Outcomes Item Anticipated Outcome  Eating/Swallowing  Supervision  Basic self-care  supervision  Tolieting  supervision  Bowel/Bladder  Min assist  Transfers  Supervision  Locomotion  Min Assist (household), Supervision (controlled)  Communication  Supervision  Cognition  Min A-Supervision  Pain    Safety/Judgment  Min assist  Other     Therapy Plan: PT Frequency: 2-3 X/day, 60-90 minutes OT Frequency: 1-2 X/day, 60-90 minutes SLP Frequency: 1-2 X/day, 30-60 minutes   Team Interventions: Item RN PT OT SLP SW TR Other  Self Care/Advanced ADL Retraining  x x      Neuromuscular Re-Education  x x      Therapeutic Activities  x x x     UE/LE Strength Training/ROM  x x      UE/LE Coordination Activities  x x      Visual/Perceptual Remediation/Compensation x x x      DME/Adaptive Equipment Instruction  x x      Therapeutic Exercise  x x      Balance/Vestibular Training  x x      Patient/Family Education x x x x     Cognitive Remediation/Compensation  x x x     Functional Mobility Training  x x      Ambulation/Gait Training  x       Museum/gallery curator  x       Wheelchair Propulsion/Positioning  x       Functional  Tourist information centre manager Reintegration  x x      Dysphagia/Aspiration Printmaker    x     Speech/Language Facilitation    x     Bladder Management         Bowel Management         Disease Management/Prevention x        Pain Management  x x      Medication Management x        Skin Care/Wound Management x        Splinting/Orthotics         Discharge Planning  x x x x    Psychosocial Support  x   x                       Team Discharge Planning: Destination:  Home Projected Follow-up:  PT, OT, SLP and Home Health Projected Equipment Needs:  Bedside Commode and Tub Bench Patient/family involved in discharge planning:  Yes  MD ELOS: 2 weeks Medical Rehab Prognosis:  Good Assessment: pt admitted for deconditioning related to this recent seizures, deconditioning, and gait disorder. Pt has good support from her husband. She will be working on self-care, NMR, safety, balance, gait, endurance, ADL's with supervision to minimal assistance goals

## 2012-04-05 DIAGNOSIS — G931 Anoxic brain damage, not elsewhere classified: Secondary | ICD-10-CM

## 2012-04-05 DIAGNOSIS — Z5189 Encounter for other specified aftercare: Secondary | ICD-10-CM

## 2012-04-05 DIAGNOSIS — R5381 Other malaise: Secondary | ICD-10-CM

## 2012-04-05 LAB — DIFFERENTIAL
Eosinophils Relative: 6 % — ABNORMAL HIGH (ref 0–5)
Lymphocytes Relative: 25 % (ref 12–46)
Monocytes Relative: 27 % — ABNORMAL HIGH (ref 3–12)
Neutrophils Relative %: 39 % — ABNORMAL LOW (ref 43–77)

## 2012-04-05 LAB — CBC
HCT: 28.7 % — ABNORMAL LOW (ref 36.0–46.0)
Hemoglobin: 10.2 g/dL — ABNORMAL LOW (ref 12.0–15.0)
MCH: 33.2 pg (ref 26.0–34.0)
MCHC: 35.5 g/dL (ref 30.0–36.0)

## 2012-04-05 LAB — COMPREHENSIVE METABOLIC PANEL
BUN: 17 mg/dL (ref 6–23)
Calcium: 8.5 mg/dL (ref 8.4–10.5)
GFR calc Af Amer: >90 mL/min (ref >90)
Glucose, Bld: 83 mg/dL (ref 70–99)
Sodium: 123 mEq/L — ABNORMAL LOW (ref 135–145)
Total Protein: 5.3 g/dL — ABNORMAL LOW (ref 6.0–8.3)

## 2012-04-05 MED ORDER — ENSURE PUDDING PO PUDG
1.0000 | Freq: Two times a day (BID) | ORAL | Status: DC
  Administered 2012-04-05 – 2012-04-17 (×19): 1 via ORAL

## 2012-04-05 NOTE — Evaluation (Addendum)
Speech Language Pathology Assessment and Plan & BSE  Patient Details  Name: Audrey Obrien MRN: [id number] Date of Birth: [birth year]  SLP Diagnosis: cognitive impairment, oral-pharyngeal dysphagia, dysphonia/ speech dusflunecy Rehab Potential: Good ELOS: 2 weeks   Today's Date: 04/05/2012 Time: 0800-0900 Time Calculation (min): 60 min  Skilled Therapeutic Intervention: Administered cognitive-linguistic evaluation and BSE. Please see below for details.   Problem List:  Patient Active Problem List  Diagnoses  . Chronic cough  . Bronchiectasis without acute exacerbation  . Chronic allergic rhinitis  . GERD (gastroesophageal reflux disease)  . UTI (lower urinary tract infection)  . Hyponatremia  . Dehydration  . HTN (hypertension)  . Seizure disorder  . Scleroderma  . Dysphagia  . Weakness generalized  . Peripheral edema  . Dilantin toxicity  . Physical deconditioning    Past Medical History:  Past Medical History  Diagnosis Date  . Hypertension   . Acne rosacea   . Chronic rhinitis     no benefit from antihistamines  . Gallbladder & bile duct stone 2007    1.6 cm GB stone; no symptoms  . Pneumonia 2007  . 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 C. difficile colitis 11/07  . Spastic dysphonia     botox injs at [facility]  . Hypertension   . Scleroderma     probable, ANA+ 1:160, anticentromers strongly pos, USRNP-, TH/TO-,RF equivocal, CCP-, Skin puffiness of hands, telangectasias of hands, raynauds, ECHO increased pulmonary pressures 8/11, pfts stable  . S/P right heart catheterization 10/11  . Benign essential tremor   . Osteoporosis     started alendronate 6/12   Past Surgical History:  Past Surgical History  Procedure Date  . Lung surgery 1982  . Corneal transplant     Assessment & Plan Clinical Impression: 78 year old right-handed female admitted April 23 with altered mental status. Patient with noted urinary tract symptoms and  dysuria for the past 3 weeks with urine culture negative. Per patient's husband, she was recently admitted to [facility] for corneal transplant 3 months ago where she had a difficult hospitalization, she had coded and was unresponsive placed on mechanical ventilation. She had 3 seizures in the ICU and followed by neurology placed on Dilantin and primidone and later discharged to home. She had no further seizures but her husband noted increased lethargy at home.The patient has a prior history of tremor and has seen neurology in the past. Her husband states she was never diagnosed with Parkinson's disease although he suspects it. She has not seen Dr. Sandria Manly for quite some time. The patient's husband states that the patient has really not regained her usual level of functioning since her ICU hospitalization earlier this year.Noted Dilantin level of 32.3. Cranial CT scan showed no acute changes. Echocardiogram ejection fraction of 60% without emboli. Venous Dopplers with no evidence of deep vein thrombosis. Subcutaneous Lovenox as been initiated for DVT prophylaxis. A followup urine culture showed 50,000 multi-bacterial. Her Dilantin was held and later resumed 04/03/2012 200 mg daily with latest Dilantin level 6.0 04/04/2012. She is currently on dysphagia 2 thin liquid diet. Physical therapy ongoing recommendations of physical medicine rehabilitation consult to consider inpatient rehabilitation services.   Pt transferred to CIR 04/04/12 and presents with a mild oral-pharyngeal dysphagia, dysphonia and speech disfluency due to tremors and moderate cognitive impairments characterized by impaired selective attention, initiation, functional problem solving, working memory and emergent awareness. Pt would benefit from skilled SLP intervention  to maximize swallowing safety with least restrictive diet, functional communication and overall cognitive function for eventual discharge home with family. Anticipate pt will need  24/7 supervision and home health follow-up.     SLP - End of Session Patient left: in bed;with call bell/phone within reach Nurse Communication: Aspiration precautions reviewed;Swallow strategies reviewed;Diet recommendation Assessment SLP Recommendation/Assessment: Patient will need skilled Speech Lanaguage Pathology Services during CIR admission Rehab Potential: Good Therapy Diagnosis: Cognitive Impairments SLP Plan SLP Frequency: 1-2 X/day, 30-60 minutes Estimated Length of Stay: 2 weeks SLP Treatment/Interventions: Cognitive remediation/compensation;Cueing hierarchy;Functional tasks;Dysphagia/aspiration precaution training;Internal/external aids;Speech/Language facilitation;Therapeutic Activities;Patient/family education;Environmental controls Recommendation Follow up Recommendations: Home Health SLP Equipment Recommended: None recommended by SLP  SLP Evaluation Precautions/Restrictions  Restrictions Weight Bearing Restrictions: No Pain Pain Assessment Pain Assessment: No/denies pain Pain Score: 0-No pain Faces Pain Scale: No hurt Cognition Overall Cognitive Status: Impaired Arousal/Alertness: Awake/alert Orientation Level: Disoriented to situation;Disoriented to time Attention: Selective Selective Attention: Impaired Selective Attention Impairment: Verbal basic;Functional basic Memory: Impaired Memory Impairment: Decreased recall of new information;Decreased short term memory Decreased Short Term Memory: Verbal basic;Functional basic Awareness: Impaired Awareness Impairment: Emergent impairment Problem Solving: Impaired Problem Solving Impairment: Verbal basic;Functional basic Executive Function: Organizing;Sequencing;Initiating Sequencing: Impaired Sequencing Impairment: Verbal basic;Functional basic Organizing: Impaired Organizing Impairment: Verbal basic;Functional basic Initiating: Impaired Initiating Impairment: Verbal basic;Functional basic Safety/Judgment:  Appears intact Comprehension Auditory Comprehension Overall Auditory Comprehension: Impaired Yes/No Questions: Impaired Complex Questions: 50-74% accurate Commands: Impaired Two Step Basic Commands: 50-74% accurate Conversation: Simple Interfering Components: Attention;Anxiety;Visual impairments;Processing speed;Working Radio broadcast assistant: Extra processing time;Repetition Reading Comprehension Reading Status: Not tested (due to vision) Expression Expression Primary Mode of Expression: Verbal Verbal Expression Initiation: Impaired Automatic Speech: Name;Social Response Level of Generative/Spontaneous Verbalization: Sentence Repetition: No impairment Naming: No impairment Pragmatics: No impairment Interfering Components: Attention Written Expression Dominant Hand: Right Written Expression: Not tested Oral/Motor Oral Motor/Sensory Function Overall Oral Motor/Sensory Function: Appears within functional limits for tasks assessed Motor Speech Overall Motor Speech: Impaired Respiration: Within functional limits Phonation:  (disfluent due to rigidity/tremors ) Resonance: Within functional limits Articulation: Within functional limitis Intelligibility: Intelligibility reduced Sentence: 75-100% accurate Motor Planning: Witnin functional limits  See FIM for current functional status Refer to Care Plan for Long Term Goals  Recommendations for other services: None  Discharge Criteria: Patient will be discharged from SLP if patient refuses treatment 3 consecutive times without medical reason, if treatment goals not met, if there is a change in medical status, if patient makes no progress towards goals or if patient is discharged from hospital.  The above assessment, treatment plan, treatment alternatives and goals were discussed and mutually agreed upon: by patient  Lisandra Mathisen 04/05/2012, 10:13 AM  Clinical/Bedside Swallow Evaluation Patient Details  Name: Audrey Obrien MRN: [id number] DOB: 1931-01-29  Today's Date: 04/05/2012 Time: 0800-0900 Time Calculation (min): 60 min  Past Medical History:  Past Medical History  Diagnosis Date  . Hypertension   . Acne rosacea   . Chronic rhinitis     no benefit from antihistamines  . Gallbladder & bile duct stone 2007    1.6 cm GB stone; no symptoms  . Pneumonia 2007  . 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 C. difficile colitis 11/07  . Spastic dysphonia     botox injs at [facility]  . Hypertension   . Scleroderma     probable, ANA+ 1:160, anticentromers strongly pos, USRNP-, TH/TO-,RF equivocal, CCP-, Skin puffiness of hands, telangectasias of hands, raynauds, ECHO  increased pulmonary pressures 8/11, pfts stable  . S/P right heart catheterization 10/11  . Benign essential tremor   . Osteoporosis     started alendronate 6/12   Past Surgical History:  Past Surgical History  Procedure Date  . Lung surgery 1982  . Corneal transplant    Assessment/Recommendations/Treatment Plan Clinical Impression: Pt without overt s/s of aspiration with breakfast of puree and dys. 2 textures and thin liquids via straw. Pt demonstrated efficient mastication but required extra time for delayed AP transit. Pt utilized small sips via straw without difficulty.  Recommend pt continue with current diet and clinician will initiate trails of Dys. 3 textures.  SLP Assessment Risk for Aspiration: Mild Other Related Risk Factors: History of GERD;History of esophageal-related issues  Swallow Evaluation Recommendations Diet Recommendations: Dysphagia 2 (Fine chop);Thin liquid Liquid Administration via: Cup;Straw Medication Administration: Whole meds with puree Supervision: Full supervision/cueing for compensatory strategies Compensations: Slow rate;Small sips/bites (alternate solids/liquids) Postural Changes and/or Swallow Maneuvers: Upright 30-60 min after meal;Seated upright 90 degrees Oral Care  Recommendations: Oral care BID  Prognosis Prognosis for Safe Diet Advancement: Good  Individuals Consulted Consulted and Agree with Results and Recommendations: Patient  Oral Motor/Sensory Function  Overall Oral Motor/Sensory Function: Appears within functional limits for tasks assessed  Consistency Results  Ice Chips Ice chips: Not tested  Thin Liquid Thin Liquid: Within functional limits Presentation: Straw  Nectar Thick Liquid Nectar Thick Liquid: Not tested  Honey Thick Liquid Honey Thick Liquid: Not tested  Puree Puree: Within functional limits Presentation: Self Fed;Spoon  Solid Solid: Not tested   Wetona Viramontes 04/05/2012,10:14 AM    .

## 2012-04-05 NOTE — Progress Notes (Addendum)
INITIAL ADULT NUTRITION ASSESSMENT Date: 04/05/2012   Time: 11:00 AM  Reason for Assessment: Nutrition Risk Report (Prior Chewing/Swallowing Issues)  ASSESSMENT: Female 78 y.o.  Dx: Physical deconditioning  Hx:  Past Medical History  Diagnosis Date  . Hypertension   . Acne rosacea   . Chronic rhinitis     no benefit from antihistamines  . Gallbladder & bile duct stone 2007    1.6 cm GB stone; no symptoms  . Pneumonia 2007  . 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 C. difficile colitis 11/07  . Spastic dysphonia     botox injs at [facility]  . Hypertension   . Scleroderma     probable, ANA+ 1:160, anticentromers strongly pos, USRNP-, TH/TO-,RF equivocal, CCP-, Skin puffiness of hands, telangectasias of hands, raynauds, ECHO increased pulmonary pressures 8/11, pfts stable  . S/P right heart catheterization 10/11  . Benign essential tremor   . Osteoporosis     started alendronate 6/12   Past Surgical History  Procedure Date  . Lung surgery 1982  . Corneal transplant    Related Meds:     . antiseptic oral rinse  15 mL Mouth Rinse BID  . aspirin EC  81 mg Oral QHS  . cholecalciferol  1,000 Units Oral Daily  . enoxaparin  40 mg Subcutaneous Q24H  . magic mouthwash  5 mL Oral TID AC & HS  . pantoprazole  80 mg Oral Q1200  . phenytoin  100 mg Oral BID  . prednisoLONE acetate  1 drop Left Eye QID  . primidone  100 mg Oral Daily  . primidone  50 mg Oral QHS  . propranolol ER  80 mg Oral BID  . zinc oxide   Topical BID   Ht: 5\' 2"  (157.5 cm)  Wt: 109 lb 12.6 oz (49.8 kg)  Ideal Wt: 50 kg % Ideal Wt: 100%  Wt Readings from Last 15 Encounters:  04/04/12 109 lb 12.6 oz (49.8 kg)  04/03/12 114 lb 3.2 oz (51.8 kg)  11/22/11 112 lb (50.803 kg)  10/22/11 117 lb 12.8 oz (53.434 kg)  10/12/11 122 lb 12.8 oz (55.702 kg)  09/15/11 121 lb 12.8 oz (55.248 kg)  Usual Wt: 121-122 lb % Usual Wt: 92%  Body mass index is 20.08 kg/(m^2). Weight is  WNL.  Food/Nutrition Related Hx: declining PO intake since last Summer 2/2 multiple hospital admission and acute medical issues  Labs:  CMP     Component Value Date/Time   NA 123* 04/05/2012 0620   K 3.9 04/05/2012 0620   CL 92* 04/05/2012 0620   CO2 24 04/05/2012 0620   GLUCOSE 83 04/05/2012 0620   BUN 17 04/05/2012 0620   CREATININE 0.64 04/05/2012 0620   CALCIUM 8.5 04/05/2012 0620   PROT 5.3* 04/05/2012 0620   ALBUMIN 2.5* 04/05/2012 0620   AST 27 04/05/2012 0620   ALT 14 04/05/2012 0620   ALKPHOS 102 04/05/2012 0620   BILITOT 0.3 04/05/2012 0620   GFRNONAA 83* 04/05/2012 0620   GFRAA >90 04/05/2012 0620    Intake/Output Summary (Last 24 hours) at 04/05/12 1102 Last data filed at 04/04/12 2200  Gross per 24 hour  Intake    240 ml  Output      0 ml  Net    240 ml   Diet Order: Dysphagia 2 with thin liquids  Supplements/Tube Feeding: none  IVF:    Estimated Nutritional Needs:   Kcal:  1200 -  1400 kcal Protein:  58 - 68 grams protein Fluid:  1.5 - 1.8 L/d  Pt followed by this RD during acute hospitalization. Wheelchair bound at baseline. Husband reports pt has difficulty eating, and has been drinking [employer] in the mornings. Husband reports intake has been highly variable, ranging for 25-75% of usual intake since November. Pt with 8% wt loss since November (x 6 months.) Meets criteria for moderate malnutrition in the context of chronic illness 2/2 this wt loss, temporal wasting evident, and <75% of energy intake x at least 1 month.  Pt received Carn[employer] Instant Breakfast during acute inpatient hospitalization, enjoys this supplement and will drink daily with milk.  Current intake remains variable, ranging form 30 - 65% of meals.   NUTRITION DIAGNOSIS: -Inadequate oral intake (NI-2.1).  Status: Ongoing  RELATED TO: fluctuating appetite and swallowing issues  AS EVIDENCE BY: declining weight and decreased PO intake  MONITORING/EVALUATION(Goals): Goal: Pt to consume >/=  50% of meals. Monitor: PO intake, weights, labs, I/O's  EDUCATION NEEDS: -Education not appropriate at this time  INTERVENTION: 1. Carn[employer] Instant Breakfast daily 2. Ensure Pudding PO BID 3. RD to continue to follow  Dietitian #: [phone]  DOCUMENTATION CODES Per approved criteria  -Non-severe (moderate) malnutrition in the context of chronic illness    Adair Laundry 04/05/2012, 11:00 AM

## 2012-04-05 NOTE — Evaluation (Signed)
Physical Therapy Assessment and Plan  Patient Details  Name: Audrey Obrien MRN: [id number] Date of Birth: 08/05/1931  PT Diagnosis: Abnormal posture, Abnormality of gait, Cognitive deficits, Coordination disorder, Difficulty walking and Muscle weakness Rehab Potential: Good ELOS: ~2 weeks   Today's Date: 04/05/2012 Time: 1102-1202 Time Calculation (min): 60 min  Problem List:  Patient Active Problem List  Diagnoses  . Chronic cough  . Bronchiectasis without acute exacerbation  . Chronic allergic rhinitis  . GERD (gastroesophageal reflux disease)  . UTI (lower urinary tract infection)  . Hyponatremia  . Dehydration  . HTN (hypertension)  . Seizure disorder  . Scleroderma  . Dysphagia  . Weakness generalized  . Peripheral edema  . Dilantin toxicity  . Physical deconditioning    Past Medical History:  Past Medical History  Diagnosis Date  . Hypertension   . Acne rosacea   . Chronic rhinitis     no benefit from antihistamines  . Gallbladder & bile duct stone 2007    1.6 cm GB stone; no symptoms  . Pneumonia 2007  . 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 C. difficile colitis 11/07  . Spastic dysphonia     botox injs at [facility]  . Hypertension   . Scleroderma     probable, ANA+ 1:160, anticentromers strongly pos, USRNP-, TH/TO-,RF equivocal, CCP-, Skin puffiness of hands, telangectasias of hands, raynauds, ECHO increased pulmonary pressures 8/11, pfts stable  . S/P right heart catheterization 10/11  . Benign essential tremor   . Osteoporosis     started alendronate 6/12   Past Surgical History:  Past Surgical History  Procedure Date  . Lung surgery 1982  . Corneal transplant     Assessment & Plan Clinical Impression:   78 year old right-handed female admitted April 23 with altered mental status. Patient with noted urinary tract symptoms and dysuria for the past 3 weeks with urine culture negative. Per patient's husband, she was  recently admitted to [facility] for corneal transplant 3 months ago where she had a difficult hospitalization, she had coded and was unresponsive placed on mechanical ventilation. She had 3 seizures in the ICU and followed by neurology placed on Dilantin and primidone and later discharged to home. She had no further seizures but her husband noted increased lethargy at home.The patient has a prior history of tremor and has seen neurology in the past. Her husband states she was never diagnosed with Parkinson's disease although he suspects it. She has not seen Dr. Sandria Manly for quite some time. The patient's husband states that the patient has really not regained her usual level of functioning since her ICU hospitalization earlier this year. Cranial CT scan showed no acute changes. Patient transferred to CIR on 04/04/2012 .    Patient currently requires moderate to max with mobility secondary to muscle weakness and decreased attention, decreased problem solving, decreased safety awareness, decreased memory and delayed processing.  Pt limited by significant deconditioning and impaired cognition. Did not speak with husband yet, pt reporting husband able to provide at least min assist.  Prior to hospitalization, patient was supervision with mobility and lived with Spouse in a House.  Home access is 2Stairs to enter. Suspect pt will benefit from rail for steps at home.   Patient will benefit from skilled PT intervention to maximize safe functional mobility, minimize fall risk and decrease caregiver burden for planned discharge home with 24 hour supervision.  Anticipate patient will benefit from follow  up HH at discharge.  PT - End of Session Endurance Deficit: Yes Endurance Deficit Description: Fatigues quickly, requires seated rest PT Assessment Rehab Potential: Good Barriers to Discharge: Other (comment) (will have to be able to ascend 2 steps) PT Plan PT Frequency: 2-3 X/day, 60-90 minutes Estimated  Length of Stay: ~2 weeks PT Treatment/Interventions: Ambulation/gait training;Balance/vestibular training;Cognitive remediation/compensation;Discharge planning;DME/adaptive equipment instruction;Functional mobility training;Neuromuscular re-education;Pain management;Patient/family education;Therapeutic Activities;Stair training;Therapeutic Exercise;UE/LE Strength taining/ROM;UE/LE Coordination activities;Wheelchair propulsion/positioning PT Recommendation Follow Up Recommendations: Home health PT Equipment Recommended: None recommended by PT  PT Evaluation Precautions/Restrictions Precautions Precautions: Fall Other Brace/Splint: Eye patch L when supine, lifting, or bending over (recent corneal implant surgery) Restrictions Weight Bearing Restrictions: No Other Position/Activity Restrictions: eye patch to Left eye when pt. supine or bending or lifting Pain Pain Assessment Pain Assessment: 0-10 Pain Score:   8 Pain Type: Acute pain Pain Location: Buttocks Pain Orientation: Left;Right Pain Descriptors: Aching Pain Onset: On-going Pain Intervention(s): Repositioned;RN made aware Home Living/Prior Functioning Home Living Lives With: Spouse Available Help at Discharge: Available 24 hours/day;Family Type of Home: House Home Access: Stairs to enter Secretary/administrator of Steps: 2 Entrance Stairs-Rails: None Home Layout: Two level;Full bath on main level;Able to live on main level with bedroom/bathroom Bathroom Shower/Tub: Walk-in shower;Door [employer] Toilet: Handicapped height Bathroom Accessibility: Yes How Accessible: Accessible via wheelchair Home Adaptive Equipment: Wheelchair - manual;Walker - rolling;Shower chair without back;Bedside commode/3-in-1 Prior Function Able to Take Stairs?: Yes Driving: No Vocation: Retired Comments: Was independent with bathing dressing and ambulation prior to "getting sick" Was not using a RW Vision/Perception    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 Corneal replacement.  Cognition Overall Cognitive Status: Impaired Arousal/Alertness: Awake/alert Orientation Level: Disoriented to situation;Disoriented to time Attention: Selective Selective Attention: Impaired Selective Attention Impairment: Verbal basic;Functional basic Memory: Impaired Memory Impairment: Decreased recall of new information;Decreased short term memory Decreased Short Term Memory: Verbal basic;Functional basic Awareness: Impaired Awareness Impairment: Emergent impairment Problem Solving: Impaired Problem Solving Impairment: Verbal basic;Functional basic Executive Function: Organizing;Sequencing;Initiating Sequencing: Impaired Sequencing Impairment: Verbal basic;Functional basic Organizing: Impaired Organizing Impairment: Verbal basic;Functional basic Initiating: Impaired Initiating Impairment: Verbal basic;Functional basic Safety/Judgment: Appears intact Sensation Sensation Light Touch: Impaired Detail Light Touch Impaired Details: Impaired LLE;Impaired RLE (toes are numb "sometimes" per pt) Motor    Pt with tremors affected whole body, UEs more impaired than LEs. Mobility Transfers Sit to Stand: 3: Mod assist;From chair/3-in-1 Sit to Stand Details: Tactile cues for placement Sit to Stand Details (indicate cue type and reason): Repeated cues needed for UE placement, PT to place hands on RW. Pt anxious about standing  Stand to Sit: 3: Mod assist;To chair/3-in-1;With armrests Stand to Sit Details (indicate cue type and reason): Verbal cues for precautions/safety;Verbal cues for sequencing Locomotion  Ambulation Ambulation: Yes Ambulation/Gait Assistance: 2: Max assist Ambulation Distance (Feet): 45 Feet Assistive device: Rolling walker;None Ambulation/Gait Assistance Details: Verbal cues for sequencing;Verbal cues for safe use of DME/AE Ambulation/Gait Assistance Details: Max assist without device, moderate assist with RW. Varying gait speed  and step length. Cues for safe distancing of RW. Forward flexed posture. Tendency to keep body to Lt. Of RW, decreased response to verbal cues.  Gait Gait: Yes Gait Pattern: Impaired Gait Pattern: Step-through pattern;Decreased stride length;Trunk flexed;Shuffle Stairs / Additional Locomotion Stairs: Yes Stairs Assistance: 1: +2 Total assist Stairs Assistance Details: Tactile cues for placement;Verbal cues for sequencing;Verbal cues for technique;Verbal cues for safe use of DME/AE Stair Management Technique: Two rails;Step to pattern;Forwards Number of Stairs: 2  Wheelchair  Mobility Distance: 31'  Trunk/Postural Assessment    kyphotic posture. Balance Balance Balance Assessed: Yes Static Sitting Balance Static Sitting - Balance Support: No upper extremity supported;Feet supported Static Sitting - Level of Assistance: 5: Stand by assistance Static Standing Balance Static Standing - Balance Support: No upper extremity supported Static Standing - Level of Assistance: 3: Mod assist Static Standing - Comment/# of Minutes: Up to moderate assist for fluctuating posterior /lateral lean Extremity Assessment      RLE Assessment RLE Assessment: Exceptions to [facility] RLE AROM (degrees) Overall AROM Right Lower Extremity: Within functional limits for tasks assessed RLE Strength RLE Overall Strength: Deficits RLE Overall Strength Comments: Deconditioning, grossly 3+/5, hamstrings 3/5 LLE Assessment LLE Assessment: Exceptions to WFL LLE AROM (degrees) LLE Overall AROM Comments: WFL LLE Strength LLE Overall Strength Comments: Deconditioning, grossly 3+/5, hamstrings 3/5  See FIM for current functional status Refer to Care Plan for Long Term Goals  Recommendations for other services: Other: Question about neurology consult if husband desires  Discharge Criteria: Patient will be discharged from PT if patient refuses treatment 3 consecutive times without medical reason, if treatment goals not  met, if there is a change in medical status, if patient makes no progress towards goals or if patient is discharged from hospital.  The above assessment, treatment plan, treatment alternatives and goals were discussed and mutually agreed upon: by patient  [name] 04/05/2012, 1:08 PM   Sherie Don) Carleene Mains PT, DPT Acute Rehabilitation [phone]

## 2012-04-05 NOTE — Progress Notes (Signed)
Patient ID: Audrey Obrien, female   DOB: 05/18/1931, 78 y.o.   MRN: [id number] Subjective/Complaints: Slept fairly well. Wearing eye patch over right eye. ROS otherwise negative  Objective: Vital Signs: Blood pressure 130/70, pulse 65, temperature 97.9 F (36.6 C), temperature source Oral, resp. rate 18, height 5\' 2"  (1.575 m), weight 49.8 kg (109 lb 12.6 oz), SpO2 94.00%. No results found.  Basename 04/05/12 0620  WBC 5.8  HGB 10.2*  HCT 28.7*  PLT 279    Basename 04/05/12 0620 04/04/12 0555 04/03/12 0555  NA 123* -- 126*  K 3.9 -- 3.9  CL 92* -- 94*  CO2 24 -- 23  GLUCOSE 83 -- 84  BUN 17 -- 12  CREATININE 0.64 0.67 --  CALCIUM 8.5 -- 8.3*   CBG (last 3)  No results found for this basename: GLUCAP:3 in the last 72 hours  Wt Readings from Last 3 Encounters:  04/04/12 49.8 kg (109 lb 12.6 oz)  04/03/12 51.8 kg (114 lb 3.2 oz)  11/22/11 50.803 kg (112 lb)    Physical Exam:  General appearance: alert, cooperative, appears stated age and no distress Head: Normocephalic, without obvious abnormality, atraumatic Eyes: conjunctivae/corneas clear. PERRL, EOM's intact. Fundi benign. Ears: normal TM's and external ear canals both ears Nose: Nares normal. Septum midline. Mucosa normal. No drainage or sinus tenderness. Throat: lips, mucosa, and tongue normal; teeth and gums normal Neck: no adenopathy, no carotid bruit, no JVD, supple, symmetrical, trachea midline and thyroid not enlarged, symmetric, no tenderness/mass/nodules Back: symmetric, no curvature. ROM normal. No CVA tenderness. Resp: clear to auscultation bilaterally Cardio: regular rate and rhythm, S1, S2 normal, no murmur, click, rub or gallop GI: soft, non-tender; bowel sounds normal; no masses,  no organomegaly Extremities: extremities normal, atraumatic, no cyanosis or edema Pulses: 2+ and symmetric Skin: Skin color, texture, turgor normal. No rashes or lesions Neurologic: resting tremor, pill rolling at times,  speech fluctuates, fairly alert. Moves all 4's.  (3-4/5) Incision/Wound: clean and intact    Assessment/Plan: 1. Functional deficits secondary to seizure disorder/gait disorder. ?PD which require 3+ hours per day of interdisciplinary therapy in a comprehensive inpatient rehab setting. Physiatrist is providing close team supervision and 24 hour management of active medical problems listed below. Physiatrist and rehab team continue to assess barriers to discharge/monitor patient progress toward functional and medical goals. FIM:       FIM - Toileting Toileting: 0: Activity did not occur     FIM - Bed/Chair Transfer Bed/Chair Transfer: 3: Sit > Supine: Mod A (lifting assist/Pt. 50-74%/lift 2 legs)     Comprehension Comprehension Mode: Visual Comprehension: 4-Understands basic 75 - 89% of the time/requires cueing 10 - 24% of the time  Expression Expression Mode: Verbal Expression: 3-Expresses basic 50 - 74% of the time/requires cueing 25 - 50% of the time. Needs to repeat parts of sentences.  Social Interaction Social Interaction: 3-Interacts appropriately 50 - 74% of the time - May be physically or verbally inappropriate.  Problem Solving Problem Solving Mode: Not assessed  Memory Memory: 2-Recognizes or recalls 25 - 49% of the time/requires cueing 51 - 75% of the time  1. Deconditioning/hypoxic encephalopathy/Dilantin toxicity  2. DVT Prophylaxis/Anticoagulation: Subcutaneous Lovenox. Monitor platelet counts any signs of bleeding  3. seizure disorder. Dilantin resumed 100 mg twice a day. Continue primidone 100 mg daily and 50 mg at bedtime. Dilantin level 6.0 04/04/2012  4. Benign essential tremor. Inderal 80 mg twice a day. Patient has seen Dr. Sandria Manly in the past. Exam  consistent with parkinson's disease.  5. History of corneal transplant 3 months ago at [facility].  6. Hyponatremia. 120-126. Followup labs with Na 123 today. Continue to check serially 7. Recent  catarct surgery- eye patch qhs  LOS (Days) 1 A FACE TO FACE EVALUATION WAS PERFORMED  Audrey Obrien 04/05/2012, 8:00 AM

## 2012-04-05 NOTE — Progress Notes (Signed)
Physical Therapy Session Note  Patient Details  Name: Audrey Obrien MRN: [id number] Date of Birth: 1931/06/21  Today's Date: 04/05/2012 Time: 1330-1403 Time Calculation (min): 33 min  Short Term Goals: Week 1:  PT Short Term Goal 1 (Week 1): Pt will perform sit <> stand with min assist, min verbal cues for technique.  PT Short Term Goal 1 - Progress (Week 1): Progressing toward goal PT Short Term Goal 2 (Week 1): Pt ambulate 100' with least restricitive assistive device with min-guard assist in controlled environment.  PT Short Term Goal 2 - Progress (Week 1): Progressing toward goal PT Short Term Goal 3 (Week 1): Pt will ascend/descent 2 steps with one rail, min assist.  PT Short Term Goal 3 - Progress (Week 1): Progressing toward goal PT Short Term Goal 4 (Week 1): Pt will self propell wheelchair in home environment x 25' with up to min assist.  PT Short Term Goal 4 - Progress (Week 1): Progressing toward goal  Skilled Therapeutic Interventions/Progress Updates:    Gait training in controlled environment, moderate assist with RW x 80'. Pt with tendency to walk in left side of RW, decreased ability to clear RW around obstacles, (decreased awareness of obstacles far ahead in path). Pt begins to "sink" into flexed knees/hips with fatigue and decreased verbalization of fatigue placing her at increased risk for falls. PT to negotiate RW at times, if possible would be good to work pt off of RW. Practiced sit <> stand from mat focusing on forward trunk lean, cues for decreased anxiety. Pt requires cues with each stand for UE placement. Performed dynamic reaching balance activities with out UE support, up to moderate assist for loss of balance.   Therapy Documentation Precautions:  Precautions Precautions: Fall Other Brace/Splint: Eye patch L when supine, lifting, or bending over (recent corneal implant surgery) Restrictions Weight Bearing Restrictions: No Other Position/Activity Restrictions: RN  spoke with MD, pt ok to participate in therapies without patch.  Vital Signs: Therapy Vitals Temp: 97.4 F (36.3 C) Temp src: Oral Pulse Rate: 66  Resp: 18  BP: 133/74 mmHg Patient Position, if appropriate: Lying Oxygen Therapy SpO2: 92 % O2 Device: None (Room air) Pain: Pain Assessment Pain Assessment: No/denies pain Pain Score: 0-No pain Faces Pain Scale: No hurt Locomotion : Ambulation Ambulation/Gait Assistance: 3: Mod assist    See FIM for current functional status  Therapy/Group: Individual Therapy  Audrey Obrien 04/05/2012, 5:57 PM

## 2012-04-05 NOTE — Progress Notes (Signed)
Patient information reviewed and entered into [employer] system by Reshard Guillet, RN, CRRN, PPS Coordinator.  Information including medical coding and functional independence measure will be reviewed and updated through discharge.     Per nursing patient was given "Data Collection Information Summary for Patients in Inpatient Rehabilitation Facilities with attached "Privacy Act Statement-Health Care Records" upon admission.   

## 2012-04-05 NOTE — Evaluation (Signed)
Occupational Therapy Assessment and Plan  Patient Details  Name: Audrey Obrien MRN: [id number] Date of Birth: 1930/12/17  OT Diagnosis: abnormal posture, acute pain, ataxia, blindness and low vision, cognitive deficits and swelling of limb Rehab Potential: Rehab Potential: Good ELOS: 2 weeks   Today's Date: 04/05/2012 Time: 0900-1000 Time Calculation (min): 60 min  Problem List:  Patient Active Problem List  Diagnoses  . Chronic cough  . Bronchiectasis without acute exacerbation  . Chronic allergic rhinitis  . GERD (gastroesophageal reflux disease)  . UTI (lower urinary tract infection)  . Hyponatremia  . Dehydration  . HTN (hypertension)  . Seizure disorder  . Scleroderma  . Dysphagia  . Weakness generalized  . Peripheral edema  . Dilantin toxicity  . Physical deconditioning    Past Medical History:  Past Medical History  Diagnosis Date  . Hypertension   . Acne rosacea   . Chronic rhinitis     no benefit from antihistamines  . Gallbladder & bile duct stone 2007    1.6 cm GB stone; no symptoms  . Pneumonia 2007  . 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 C. difficile colitis 11/07  . Spastic dysphonia     botox injs at [facility]  . Hypertension   . Scleroderma     probable, ANA+ 1:160, anticentromers strongly pos, USRNP-, TH/TO-,RF equivocal, CCP-, Skin puffiness of hands, telangectasias of hands, raynauds, ECHO increased pulmonary pressures 8/11, pfts stable  . S/P right heart catheterization 10/11  . Benign essential tremor   . Osteoporosis     started alendronate 6/12   Past Surgical History:  Past Surgical History  Procedure Date  . Lung surgery 1982  . Corneal transplant     Assessment & Plan Clinical Impression: Patient is a 78 y.o. year old female with recent admission to the hospital on 03/28/12 with altered mental status after recent complicated hospitalization.  Patient had recent corneal transplant, became unresponsive,  required mechanical ventilation, had three seizures.  She was stabilized and discharged home.  This admission, 4/23, she was complaining of urinary tract symptoms for several weeks.  Patient determined to have dilantin toxicity, and hypoxic encephalopathy.  Patient transferred to CIR on 04/04/2012 .    Patient currently requires mod with basic self-care skills secondary to abnormal tone, unbalanced muscle activation, ataxia and decreased coordination, decreased visual acuity and decreased initiation, decreased attention, decreased awareness, decreased problem solving, decreased safety awareness, decreased memory and delayed processing.  Prior to hospitalization, patient could complete Basic self care skills with min.  Patient will benefit from skilled intervention to decrease level of assist with basic self-care skills and increase independence with basic self-care skills prior to discharge home with care partner.  Anticipate patient will require 24 hour supervision and follow up home health.  OT - End of Session Activity Tolerance: Tolerates 30+ min activity with multiple rests Endurance Deficit: Yes Endurance Deficit Description: Fatigues quickly, requires seated rest OT Assessment Rehab Potential: Good Barriers to Discharge: None OT Plan OT Frequency: 1-2 X/day, 60-90 minutes Estimated Length of Stay: 2 weeks OT Treatment/Interventions: Balance/vestibular training;Cognitive remediation/compensation;Community reintegration;Discharge planning;DME/adaptive equipment instruction;Functional mobility training;Neuromuscular re-education;Pain management;Patient/family education;Self Care/advanced ADL retraining;Therapeutic Activities;Therapeutic Exercise;UE/LE Strength taining/ROM;Visual/perceptual remediation/compensation;UE/LE Coordination activities OT Recommendation Follow Up Recommendations: Home health OT Equipment Recommended:  (TBD)  OT Evaluation Precautions/Restrictions   Precautions Precautions: Fall General Chart Reviewed: Yes   Pain:  5/10 buttocks  RN made aware   Home Living/Prior Functioning Home  Living Lives With: Spouse Available Help at Discharge: Available 24 hours/day;Family Type of Home: House Home Access: Stairs to enter [employer] of Steps: 2 Entrance Stairs-Rails: None Home Layout: Two level;Full bath on main level;Able to live on main level with bedroom/bathroom Bathroom Shower/Tub: Walk-in shower;Door [employer] Toilet: Handicapped height Bathroom Accessibility: Yes How Accessible: Accessible via wheelchair Home Adaptive Equipment: Wheelchair - manual;Walker - rolling;Shower chair without back;Bedside commode/3-in-1 Prior Function Able to Take Stairs?: Yes Driving: No Vocation: Retired Comments: Was independent with bathing dressing and ambulation prior to "getting sick" Was not using a RW   Vision/Perception  Vision - History Visual History: Cataracts (Recent corneal transplant) Patient Visual Report: Blurring of vision;Eye fatigue/eye pain/headache Vision - Assessment Eye Alignment: Impaired (comment) Vision Assessment: Vision impaired - to be further tested in functional context Additional Comments: recent corneal transplant, with left eye nasally oriented Perception Perception:  (limited ability to flex forward over base of support) Praxis Praxis: Intact  Cognition Overall Cognitive Status: Impaired Arousal/Alertness: Awake/alert Orientation Level: Disoriented to situation;Disoriented to time Attention: Selective Selective Attention: Impaired Selective Attention Impairment: Functional basic Memory: Impaired Memory Impairment: Retrieval deficit;Decreased recall of new information;Decreased short term memory Decreased Short Term Memory: Functional basic Awareness: Impaired Awareness Impairment: Emergent impairment Problem Solving: Impaired Problem Solving Impairment: Functional basic Executive  Function: Initiating;Decision Psychologist, counselling: Impaired Sequencing Impairment: Functional basic Organizing: Impaired Organizing Impairment: Functional basic Decision Making: Impaired Decision Making Impairment: Functional basic Initiating: Impaired Initiating Impairment: Functional basic Behaviors:  (poor initiation, delayed response, passive) Safety/Judgment: Impaired Sensation Sensation Light Touch: Not tested Stereognosis: Not tested Hot/Cold: Appears Intact Proprioception: Appears Intact Coordination Gross Motor Movements are Fluid and Coordinated: No Fine Motor Movements are Fluid and Coordinated: No Coordination and Movement Description: resting tremors with increased amplitude with effort Motor  Motor Motor: Abnormal tone;Ataxia;Abnormal postural alignment and control Motor - Skilled Clinical Observations: Poor graded movement control, tremors, hesitation with forward flexion to stand Mobility  Transfers Sit to Stand: 3: Mod assist;From chair/3-in-1 Sit to Stand Details: Tactile cues for placement Sit to Stand Details (indicate cue type and reason): Repeated cues needed for UE placement, PT to place hands on RW. Pt anxious about standing  Stand to Sit: 3: Mod assist;To chair/3-in-1;With armrests Stand to Sit Details (indicate cue type and reason): Verbal cues for precautions/safety;Verbal cues for sequencing  Trunk/Postural Assessment  Postural Control Postural Control: Deficits on evaluation  Balance Balance Balance Assessed: Yes Static Sitting Balance Static Sitting - Balance Support: No upper extremity supported Static Sitting - Level of Assistance: 5: Stand by assistance Static Standing Balance Static Standing - Balance Support: Bilateral upper extremity supported Static Standing - Level of Assistance: 3: Mod assist Static Standing - Comment/# of Minutes: 15-30 sec with ADL activity, very reliant on UE's for stability Extremity/Trunk  Assessment RUE Assessment RUE Assessment: Exceptions to [facility] (resting tremors) LUE Assessment LUE Assessment: Exceptions to [facility] (resting tremor)  See FIM for current functional status Refer to Care Plan for Long Term Goals  Recommendations for other services: None  Discharge Criteria: Patient will be discharged from OT if patient refuses treatment 3 consecutive times without medical reason, if treatment goals not met, if there is a change in medical status, if patient makes no progress towards goals or if patient is discharged from hospital.  The above assessment, treatment plan, treatment alternatives and goals were discussed and mutually agreed upon: by patient  Collier Salina 04/05/2012, 4:32 PM

## 2012-04-05 NOTE — Progress Notes (Signed)
Recreational Therapy Session Note  Patient Details  Name: SHELEEN CONCHAS MRN: [id number] Date of Birth: 19-Dec-1930 Today's Date: 04/05/2012  Pain: c/o pain in her left lower gums, RN aware Skilled Therapeutic Interventions/Progress Updates: initiated leisure interests assessment, pt fatigued from previous therapies and sleepy.  Full eval to follow Therapy/Group: Individual Therapy  Shirlena Brinegar 04/05/2012, 4:59 PM

## 2012-04-06 MED ORDER — NAPHAZOLINE-PHENIRAMINE 0.025-0.3 % OP SOLN
1.0000 [drp] | Freq: Four times a day (QID) | OPHTHALMIC | Status: DC | PRN
  Administered 2012-04-06 – 2012-04-08 (×2): 1 [drp] via OPHTHALMIC
  Filled 2012-04-06 (×2): qty 15

## 2012-04-06 MED ORDER — NAPHAZOLINE HCL 0.1 % OP SOLN
1.0000 [drp] | Freq: Four times a day (QID) | OPHTHALMIC | Status: DC | PRN
  Filled 2012-04-06: qty 15

## 2012-04-06 MED ORDER — PREDNISOLONE ACETATE 1 % OP SUSP
1.0000 [drp] | Freq: Four times a day (QID) | OPHTHALMIC | Status: AC
  Administered 2012-04-06 – 2012-04-11 (×17): 1 [drp] via OPHTHALMIC
  Filled 2012-04-06: qty 1

## 2012-04-06 NOTE — Progress Notes (Signed)
Occupational Therapy Session Note  Patient Details  Name: Audrey Obrien MRN: [id number] Date of Birth: [birth year]  Today's Date: 04/06/2012 Time: 0915-1000 Time Calculation (min): 45 min  Short Term Goals: Week 1:  OT Short Term Goal 1 (Week 1): Patient will dress upper body with minimal assistance OT Short Term Goal 2 (Week 1): Patient will dress lower body with minimal assistance OT Short Term Goal 3 (Week 1): Patient will bathe herself with minimal assistance OT Short Term Goal 4 (Week 1): Patient will complete toileting with moderate assistance OT Short Term Goal 5 (Week 1): Patient will complete grooming tasks with set up and cueing.  Skilled Therapeutic Interventions/Progress Updates:    Pt engaged in ADL retraining including bathing and dressing w/c level at sink.  Pt participated in selecting clothing to wear.  Pt required min verbal cues for initiation of bathing and dressing tasks but sustained attention throughout tasks.  Pt required min max verbal cues for BUE positioning prior to sit to stand but required only steady assist while standing to bathe buttocks and pull up pants.  Pt required assistance to don socks and shoes.  Pt completed grooming tasks seated at sink.  Therapy Documentation Precautions:  Precautions Precautions: Fall Other Brace/Splint: Eye patch L when supine, lifting, or bending over (recent corneal implant surgery) Restrictions Weight Bearing Restrictions: No Other Position/Activity Restrictions: RN spoke with MD, pt ok to participate in therapies without patch.    Pain: Pain Assessment Pain Assessment: No/denies pain  See FIM for current functional status  Therapy/Group: Individual Therapy  Rich Brave 04/06/2012, 12:27 PM

## 2012-04-06 NOTE — Care Management Note (Addendum)
[facility] Individual Statement of Services  Patient Name:  Audrey Obrien  Date:  04/06/2012  Welcome to the [facility].  Our goal is to provide you with an individualized program based on your diagnosis and situation, designed to meet your specific needs.  With this comprehensive rehabilitation program, you will be expected to participate in at least 3 hours of rehabilitation therapies Monday-Friday, with modified therapy programming on the weekends.  Your rehabilitation program will include the following services:  Physical Therapy (PT), Occupational Therapy (OT), Speech Therapy (ST), 24 hour per day rehabilitation nursing, Therapeutic Recreaction (TR), Case Management (RN and Child psychotherapist), Rehabilitation Medicine, Nutrition Services and Pharmacy Services  Weekly team conferences will be held on  Tuesday  to discuss your progress.  Your RN Case Designer, television/film set will talk with you frequently to get your input and to update you on team discussions.  Team conferences with you and your family in attendance may also be held.  Expected length of stay: @2  weeks  Overall anticipated outcome: Supervision- Min Assist  Depending on your progress and recovery, your program may change.  Your RN Case Estate agent will coordinate services and will keep you informed of any changes.  Your RN Sports coach and SW names and contact numbers are listed  below.  The following services may also be recommended but are not provided by the [facility]:   Driving Evaluations  Home Health Rehabiltiation Services  Outpatient Rehabilitatation [facility]  Vocational Rehabilitation   Arrangements will be made to provide these services after discharge if needed.  Arrangements include referral to agencies that provide these services.  Your insurance has been verified to be:  Medicare + Tricare Your primary doctor is:  Dr. Theressa Millard  Pertinent information will be shared with your doctor and your insurance company.  Case Manager: Melanee Spry, [facility] [phone]  Social Worker:  Amada Jupiter, [place] [phone]  Information discussed with pt & husband & a daughter & copy given to them by: Brock Ra, 04/06/2012, 4:41 PM

## 2012-04-06 NOTE — Progress Notes (Signed)
Speech Language Pathology Daily Session Note  Patient Details  Name: Audrey Obrien MRN: [id number] Date of Birth: [birth year]  Today's Date: 04/06/2012 Time: 1445-1530 Time Calculation (min): 45 min  Short Term Goals:  SLP Short Term Goal 1 (Week 1): Pt will consume trials of Dys. 3 and demonstrate efficient mastication with Min verbal cues SLP Short Term Goal 2 (Week 1): Pt will demonstrate selective attention in mildly distracting enviornment for ~10 mins with Mod verbal cues for redirection SLP Short Term Goal 3 (Week 1): Pt will intiate funtional tasks (grooming) with Min A verbal cues. SLP Short Term Goal 4 (Week 1): Pt will demonstrate functional problem solving with basic and familair tasks with Min A verbal and visual cues.  SLP Short Term Goal 5 (Week 1): pt will utilize external aids to increase recall of new, daily information with Min A verbal and semantic cues.   Skilled Therapeutic Interventions: Treatment focus on problem solving and working memory. Pt's vision impacting ability for pt to read daily schedule and identify coins in money management task. Pt reported increased pain and decreased vision in her left eye today due to not wearing her eye patch last night. Pt required Mod A verbal cues initiate tasks. [name] attended session and was able to provide history of pt's medical course.    Daily Session FIM:  Comprehension Comprehension Mode: Auditory Comprehension: 4-Understands basic 75 - 89% of the time/requires cueing 10 - 24% of the time Expression Expression: 3-Expresses basic 50 - 74% of the time/requires cueing 25 - 50% of the time. Needs to repeat parts of sentences. Social Interaction Social Interaction: 3-Interacts appropriately 50 - 74% of the time - May be physically or verbally inappropriate. Problem Solving Problem Solving: 3-Solves basic 50 - 74% of the time/requires cueing 25 - 49% of the time Memory Memory: 3-Recognizes or recalls 50 - 74% of the  time/requires cueing 25 - 49% of the time Pain Pain Assessment Pain Assessment: No/denies pain  Therapy/Group: Individual Therapy  [name] January 04/06/2012, 5:07 PM

## 2012-04-06 NOTE — Progress Notes (Signed)
Physical Therapy Session Note  Patient Details  Name: Audrey Obrien MRN: [id number] Date of Birth: 1931-03-31  Today's Date: 04/06/2012 Time: 0730-0829 Time Calculation (min): 59 min  Short Term Goals: Week 1:  PT Short Term Goal 1 (Week 1): Pt will perform sit <> stand with min assist, min verbal cues for technique.  PT Short Term Goal 1 - Progress (Week 1): Progressing toward goal PT Short Term Goal 2 (Week 1): Pt ambulate 100' with least restricitive assistive device with min-guard assist in controlled environment.  PT Short Term Goal 2 - Progress (Week 1): Progressing toward goal PT Short Term Goal 3 (Week 1): Pt will ascend/descent 2 steps with one rail, min assist.  PT Short Term Goal 3 - Progress (Week 1): Progressing toward goal PT Short Term Goal 4 (Week 1): Pt will self propell wheelchair in home environment x 25' with up to min assist.  PT Short Term Goal 4 - Progress (Week 1): Progressing toward goal  Skilled Therapeutic Interventions/Progress Updates:    Pt very concerned about Lt. Eye irritation, contacted RN and MD in room a short time later. Pt to have eye drops. Performed heel cord stretch (tight bilaterally) in bed and trunk rotation for warm up. Performed short distance ambulation (30') in room with hand hold assist x 3 bouts, up to moderate assist for balance. Verbal cues for upright posture and hip/knee extension. Static balance at sink, one UE support while combing hair, min-guard assist. Ambulation with RW x 100' with min assist. Tactile cues for Rt. Weight shift, verbal cues for knee/hip extension as pt tends to "sink" with fatigue. 1 Step up/downs for strengthening, bil. Rails for supports. Performed 10x each leg, intermittent min assist secondary to weakness. Dynamic reaching activities working on limits of stability. 2 stand pivot transfers without RW, steady assist. Verbal cues for UE placement required with each sit<>stand, pt not demonstrating much retention at this  time.  Therapy Documentation Precautions:  Precautions Precautions: Fall Other Brace/Splint: Eye patch L when supine, lifting, or bending over (recent corneal implant surgery) Restrictions Weight Bearing Restrictions: No Other Position/Activity Restrictions: RN spoke with MD, pt ok to participate in therapies without patch.  Locomotion : Ambulation Ambulation/Gait Assistance: 4: Min assist   See FIM for current functional status  Therapy/Group: Individual Therapy  [name] 04/06/2012, 6:30 PM

## 2012-04-06 NOTE — Progress Notes (Signed)
Occupational Therapy Session Note  Patient Details  Name: Audrey Obrien MRN: [id number] Date of Birth: [birth year]  Today's Date: 04/06/2012 Time: 1345-1430 Time Calculation (min): 45 min  Short Term Goals: Week 1:  OT Short Term Goal 1 (Week 1): Patient will dress upper body with minimal assistance OT Short Term Goal 2 (Week 1): Patient will dress lower body with minimal assistance OT Short Term Goal 3 (Week 1): Patient will bathe herself with minimal assistance OT Short Term Goal 4 (Week 1): Patient will complete toileting with moderate assistance OT Short Term Goal 5 (Week 1): Patient will complete grooming tasks with set up and cueing.  Skilled Therapeutic Interventions/Progress Updates:    Pt requested to use bathroom and amb with r/w to bathroom with min A and verbal cues to located bathroom door secondary to visual deficitis (pt states her vision in left eye is worse today since staff could not locate eye patch last night).  Pt transitioned to ADL apartment to practice bed transfers/mobility and walk in shower transfers (stepping over edge).  Pt completed all tasks with min A and verbal cues for directions.  Pt completes familiar tasks with more confidence and exhibits some hesitancy with new or unfamiliar tasks.  Pt stated she did not use r/w at home and requires verbal cues for correct positioning in r/w.  Therapy Documentation Precautions:  Precautions Precautions: Fall Other Brace/Splint: Eye patch L when supine, lifting, or bending over (recent corneal implant surgery) Restrictions Weight Bearing Restrictions: No Other Position/Activity Restrictions: RN spoke with MD, pt ok to participate in therapies without patch.    Pain: Pain Assessment Pain Assessment: No/denies pain  See FIM for current functional status  Therapy/Group: Individual Therapy  Rich Brave 04/06/2012, 2:56 PM

## 2012-04-06 NOTE — Progress Notes (Signed)
Patient ID: Audrey Obrien, female   DOB: December 20, 1930, 78 y.o.   MRN: [id number] Patient ID: Audrey Obrien, female   DOB: 01-Nov-1931, 78 y.o.   MRN: [id number] Subjective/Complaints: LEFT eye irritated and vision a little cloudy. They couldn't find her eye cover last night and she wore gauze over the eye ROS otherwise negative  Objective: Vital Signs: Blood pressure 120/71, pulse 68, temperature 97.7 F (36.5 C), temperature source Oral, resp. rate 19, height 5\' 2"  (1.575 m), weight 49.6 kg (109 lb 5.6 oz), SpO2 96.00%. No results found.  Basename 04/05/12 0620  WBC 5.8  HGB 10.2*  HCT 28.7*  PLT 279    Basename 04/05/12 0620 04/04/12 0555  NA 123* --  K 3.9 --  CL 92* --  CO2 24 --  GLUCOSE 83 --  BUN 17 --  CREATININE 0.64 0.67  CALCIUM 8.5 --   CBG (last 3)  No results found for this basename: GLUCAP:3 in the last 72 hours  Wt Readings from Last 3 Encounters:  04/05/12 49.6 kg (109 lb 5.6 oz)  04/03/12 51.8 kg (114 lb 3.2 oz)  11/22/11 50.803 kg (112 lb)    Physical Exam:  General appearance: alert, cooperative, appears stated age and no distress Head: Normocephalic, without obvious abnormality, atraumatic Eyes: conjunctivae/corneas clear. PERRL, EOM's intact. Fundi benign. left sclera is irritated and slightly red Ears: normal TM's and external ear canals both ears Nose: Nares normal. Septum midline. Mucosa normal. No drainage or sinus tenderness. Throat: lips, mucosa, and tongue normal; teeth and gums normal Neck: no adenopathy, no carotid bruit, no JVD, supple, symmetrical, trachea midline and thyroid not enlarged, symmetric, no tenderness/mass/nodules Back: symmetric, no curvature. ROM normal. No CVA tenderness. Resp: clear to auscultation bilaterally Cardio: regular rate and rhythm, S1, S2 normal, no murmur, click, rub or gallop GI: soft, non-tender; bowel sounds normal; no masses,  no organomegaly Extremities: extremities normal, atraumatic, no cyanosis or  edema Pulses: 2+ and symmetric Skin: Skin color, texture, turgor normal. No rashes or lesions Neurologic: resting tremor, pill rolling at times, speech fluctuates, fairly alert. Moves all 4's.  (3-4/5). Walks with narrow base of support and shuffling gait. Incision/Wound: clean and intact    Assessment/Plan: 1. Functional deficits secondary to seizure disorder/gait disorder. ?PD which require 3+ hours per day of interdisciplinary therapy in a comprehensive inpatient rehab setting. Physiatrist is providing close team supervision and 24 hour management of active medical problems listed below. Physiatrist and rehab team continue to assess barriers to discharge/monitor patient progress toward functional and medical goals. FIM: FIM - Bathing Bathing Steps Patient Completed: Chest;Right Arm;Left Arm;Abdomen;Right upper leg;Left upper leg Bathing: 3: Mod-Patient completes 5-7 47f 10 parts or 50-74%  FIM - Upper Body Dressing/Undressing Upper body dressing/undressing steps patient completed: Thread/unthread right bra strap;Thread/unthread left bra strap;Thread/unthread right sleeve of pullover shirt/dresss;Thread/unthread left sleeve of pullover shirt/dress Upper body dressing/undressing: 3: Mod-Patient completed 50-74% of tasks FIM - Lower Body Dressing/Undressing Lower body dressing/undressing steps patient completed: Thread/unthread right underwear leg;Thread/unthread left underwear leg;Thread/unthread right pants leg;Don/Doff right sock;Don/Doff left sock;Don/Doff left shoe Lower body dressing/undressing: 2: Max-Patient completed 25-49% of tasks  FIM - Toileting Toileting steps completed by patient: Adjust clothing prior to toileting;Performs perineal hygiene;Adjust clothing after toileting Toileting Assistive Devices: Grab bar or rail for support Toileting: 4: Steadying assist     FIM - Bed/Chair Transfer Bed/Chair Transfer: 4: Sit > Supine: Min A (steadying pt. > 75%/lift 1 leg);3: Bed >  Chair or W/C: Mod A (lift  or lower assist)  FIM - Locomotion: Wheelchair Distance: 25' Locomotion: Wheelchair: 1: Travels less than 50 ft with minimal assistance (Pt.>75%) FIM - Locomotion: Ambulation Locomotion: Ambulation Assistive Devices: Designer, industrial/product Ambulation/Gait Assistance: 3: Mod assist Locomotion: Ambulation: 2: Travels 50 - 149 ft with moderate assistance (Pt: 50 - 74%)  Comprehension Comprehension Mode: Auditory Comprehension: 4-Understands basic 75 - 89% of the time/requires cueing 10 - 24% of the time  Expression Expression Mode: Verbal Expression: 3-Expresses basic 50 - 74% of the time/requires cueing 25 - 50% of the time. Needs to repeat parts of sentences.  Social Interaction Social Interaction: 3-Interacts appropriately 50 - 74% of the time - May be physically or verbally inappropriate.  Problem Solving Problem Solving Mode: Not assessed Problem Solving: 3-Solves basic 50 - 74% of the time/requires cueing 25 - 49% of the time  Memory Memory: 3-Recognizes or recalls 50 - 74% of the time/requires cueing 25 - 49% of the time  1. Deconditioning/hypoxic encephalopathy/Dilantin toxicity  2. DVT Prophylaxis/Anticoagulation: Subcutaneous Lovenox. Monitor platelet counts any signs of bleeding  3. seizure disorder. Dilantin resumed 100 mg twice a day. Continue primidone 100 mg daily and 50 mg at bedtime. Dilantin level 6.0 04/04/2012  4. Benign essential tremor. Inderal 80 mg twice a day. Patient has seen Dr. Sandria Manly in the past. Exam and gait are consistent with parkinson's disease in my opinion..  5. History of corneal transplant 3 months ago at [facility]. Will add saline drops for eye irritation. Needs to have eye gtts. 6. Hyponatremia. 120-126. Followup labs with Na 123 today. Continue to check serially 7. Recent catarct surgery- eye patch qhs  LOS (Days) 2 A FACE TO FACE EVALUATION WAS PERFORMED  Mery Guadalupe T 04/06/2012, 7:55 AM

## 2012-04-07 NOTE — Progress Notes (Signed)
Occupational Therapy Session Note  Patient Details  Name: Audrey Obrien MRN: [id number] Date of Birth: [birth year]  Today's Date: 04/07/2012 Time:  1015- 1100    Short Term Goals: Week 1:  OT Short Term Goal 1 (Week 1): Patient will dress upper body with minimal assistance OT Short Term Goal 2 (Week 1): Patient will dress lower body with minimal assistance OT Short Term Goal 3 (Week 1): Patient will bathe herself with minimal assistance OT Short Term Goal 4 (Week 1): Patient will complete toileting with moderate assistance OT Short Term Goal 5 (Week 1): Patient will complete grooming tasks with set up and cueing.  Skilled Therapeutic Interventions/Progress Updates:    Pt engaged in ADL retraining including bathing and dressing tasks seated in w/c at sink.  Pt exhibited increased active participation when challenged to complete task without assistance.  Pt continues to requires min verbal cues to initiate tasks and for positioning of BUE prior to standing.  Pt prefers to pull up objects when standing and to lean on objects. Pt states she does not feel steady when standing but does not exhibit any loss of balance with static standing.  Focus on activity tolerance, initiation, and standing balance.  Therapy Documentation Precautions:  Precautions Precautions: Fall Other Brace/Splint: Eye patch L when supine, lifting, or bending over (recent corneal implant surgery) Restrictions Weight Bearing Restrictions: No Other Position/Activity Restrictions: RN spoke with MD, pt ok to participate in therapies without patch.    Pain: Pain Assessment Pain Assessment: No/denies pain  See FIM for current functional status  Therapy/Group: Individual Therapy  Rich Brave 04/07/2012, 3:59 PM

## 2012-04-07 NOTE — Progress Notes (Signed)
Social Work  Social Work Assessment and Plan  Patient Details  Name: Audrey Obrien MRN: [id number] Date of Birth: 1931-01-02  Today's Date: 04/07/2012  Problem List:  Patient Active Problem List  Diagnoses  . Chronic cough  . Bronchiectasis without acute exacerbation  . Chronic allergic rhinitis  . GERD (gastroesophageal reflux disease)  . UTI (lower urinary tract infection)  . Hyponatremia  . Dehydration  . HTN (hypertension)  . Seizure disorder  . Scleroderma  . Dysphagia  . Weakness generalized  . Peripheral edema  . Dilantin toxicity  . Physical deconditioning   Past Medical History:  Past Medical History  Diagnosis Date  . Hypertension   . Acne rosacea   . Chronic rhinitis     no benefit from antihistamines  . Gallbladder & bile duct stone 2007    1.6 cm GB stone; no symptoms  . Pneumonia 2007  . 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 C. difficile colitis 11/07  . Spastic dysphonia     botox injs at [facility]  . Hypertension   . Scleroderma     probable, ANA+ 1:160, anticentromers strongly pos, USRNP-, TH/TO-,RF equivocal, CCP-, Skin puffiness of hands, telangectasias of hands, raynauds, ECHO increased pulmonary pressures 8/11, pfts stable  . S/P right heart catheterization 10/11  . Benign essential tremor   . Osteoporosis     started alendronate 6/12   Past Surgical History:  Past Surgical History  Procedure Date  . Lung surgery 1982  . Corneal transplant    Social History:  reports that she has never smoked. She has never used smokeless tobacco. She reports that she does not drink alcohol or use illicit drugs.  Family / Support Systems Marital Status: Married How Long?: 47 years Patient Roles: Spouse;Parent Spouse/Significant Other: husband, Audrey Obrien @ [phone] Children: pt with two sons living in [place] and [place]; husband with two daughters living locally Anticipated Caregiver: husband Ability/Limitations of Caregiver:  no limitations Caregiver Availability: 24/7 Family Dynamics: husband very attentive  - he also notes that daughters are very supportive and "will help as much as they can"  Social History Preferred language: English Religion: Catholic Cultural Background: NA Education: quit school in 11th grade to begin working Read: Yes Write: Yes Employment Status: Retired Date Retired/Disabled/Unemployed: actually stopped working "many years ago...when the kids were little" Legal Hisotry/Current Legal Issues: none Guardian/Conservator: none   Abuse/Neglect Physical Abuse: Denies Verbal Abuse: Denies Sexual Abuse: Denies Exploitation of patient/patient's resources: Denies Self-Neglect: Denies  Emotional Status Pt's affect, behavior adn adjustment status: Frail appearing woman sitting in w/c and alone in room initially.  Able to provide basic information about herself and no noted emotional distress.  Upon husband's arrival, her affect does brighten and she looks to him to continue with providing additional informaiton Recent Psychosocial Issues: Recent eye surgery Pyschiatric History: none Substance Abuse History: none  Patient / Family Perceptions, Expectations & Goals Pt/Family understanding of illness & functional limitations: pt initially reports the reason for her hospitalization was "...for my eyes" - little recall of events following eye surgery but is aware she suffered seizures.  Husband with better understanding of her medical course and able to give more detailed account. Premorbid pt/family roles/activities: Husband has been providing caregiver support to pt for several years due to pt's chronic health problems;   pt remains passive and quiet throughout my discussions with husband Anticipated changes in roles/activities/participation: Little change anticipated for pt  and husband - husband agrees noting "I can do this.Marland KitchenMarland KitchenI have been" Pt/family expectations/goals: Pt without any specific  goals other than going home;  husband is hopeful that she can reach at least a minimal assistance level.  Community [employer] Agencies: None Premorbid Home Care/DME Agencies: Other (Comment) ([facility] in past) Transportation available at discharge: yes Resource referrals recommended: Other (Comment) (Parkinson's even though no formal diagnosis?)  Discharge Planning Living Arrangements: Spouse/significant other Support Systems: Spouse/significant other;Children Type of Residence: Private residence Insurance Resources: Administrator (specify) (and Location manager) Financial Resources: Restaurant manager, fast food Screen Referred: No Living Expenses: Own Money Management: Spouse Do you have any problems obtaining your medications?: No Home Management: husband Patient/Family Preliminary Plans: Pt to return home with husband who will resume caregiver support 24/7  Social Work Anticipated Follow Up Needs: HH/OP;Support Group (Parkinson's/ Caregiver groups) Expected length of stay: 2 weeks  Clinical Impression  Pleasant and generally oriented woman who is able to provide basic personal information.  Brightens when husband enters her room and then looks to him to continue providing additional information.  Expect there will be little to no change in roles upon d/c as husband has been pt's caregiver for several years.  Good support from local daughters.  Will follow for emotional support and d/c planning.  Audrey Obrien  Audrey Obrien 04/07/2012, 10:08 AM

## 2012-04-07 NOTE — Progress Notes (Signed)
No new complaints. Up with therapies already today ROS otherwise negative  Objective: Vital Signs: Blood pressure 131/75, pulse 67, temperature 97.8 F (36.6 C), temperature source Oral, resp. rate 18, height 5\' 2"  (1.575 m), weight 49.6 kg (109 lb 5.6 oz), SpO2 94.00%. No results found.  Basename 04/05/12 0620  WBC 5.8  HGB 10.2*  HCT 28.7*  PLT 279    Basename 04/05/12 0620  NA 123*  K 3.9  CL 92*  CO2 24  GLUCOSE 83  BUN 17  CREATININE 0.64  CALCIUM 8.5   CBG (last 3)  No results found for this basename: GLUCAP:3 in the last 72 hours  Wt Readings from Last 3 Encounters:  04/05/12 49.6 kg (109 lb 5.6 oz)  04/03/12 51.8 kg (114 lb 3.2 oz)  11/22/11 50.803 kg (112 lb)    Physical Exam:  General appearance: alert, cooperative, appears stated age and no distress Head: Normocephalic, without obvious abnormality, atraumatic Eyes: conjunctivae/corneas clear. PERRL, EOM's intact. Fundi benign. left sclera appears less irritated Ears: normal TM's and external ear canals both ears Nose: Nares normal. Septum midline. Mucosa normal. No drainage or sinus tenderness. Throat: lips, mucosa, and tongue normal; teeth and gums normal Neck: no adenopathy, no carotid bruit, no JVD, supple, symmetrical, trachea midline and thyroid not enlarged, symmetric, no tenderness/mass/nodules Back: symmetric, no curvature. ROM normal. No CVA tenderness. Resp: clear to auscultation bilaterally Cardio: regular rate and rhythm, S1, S2 normal, no murmur, click, rub or gallop GI: soft, non-tender; bowel sounds normal; no masses,  no organomegaly Extremities: extremities normal, atraumatic, no cyanosis or edema Pulses: 2+ and symmetric Skin: Skin color, texture, turgor normal. No rashes or lesions Neurologic: resting tremor, pill rolling at times, speech fluctuates, fairly alert. Moves all 4's.  (3-4/5). Walks with narrow base of support and shuffling gait. Incision/Wound: clean and intact     Assessment/Plan: 1. Functional deficits secondary to seizure disorder/gait disorder. ?PD which require 3+ hours per day of interdisciplinary therapy in a comprehensive inpatient rehab setting. Physiatrist is providing close team supervision and 24 hour management of active medical problems listed below. Physiatrist and rehab team continue to assess barriers to discharge/monitor patient progress toward functional and medical goals. FIM: FIM - Bathing Bathing Steps Patient Completed: Chest;Right Arm;Left Arm;Abdomen;Front perineal area;Buttocks;Right upper leg;Left upper leg Bathing: 4: Min-Patient completes 8-9 69f 10 parts or 75+ percent  FIM - Upper Body Dressing/Undressing Upper body dressing/undressing steps patient completed: Thread/unthread right sleeve of pullover shirt/dresss;Thread/unthread left sleeve of pullover shirt/dress Upper body dressing/undressing: 3: Mod-Patient completed 50-74% of tasks FIM - Lower Body Dressing/Undressing Lower body dressing/undressing steps patient completed: Thread/unthread right underwear leg;Thread/unthread left underwear leg;Pull underwear up/down;Thread/unthread right pants leg;Thread/unthread left pants leg;Pull pants up/down Lower body dressing/undressing: 3: Mod-Patient completed 50-74% of tasks  FIM - Toileting Toileting steps completed by patient: Adjust clothing prior to toileting;Performs perineal hygiene Toileting Assistive Devices: Grab bar or rail for support Toileting: 2: Max-Patient completed 1 of 3 steps  FIM - Diplomatic Services operational officer Devices: Psychiatrist Transfers: 4-To toilet/BSC: Min A (steadying Pt. > 75%);4-From toilet/BSC: Min A (steadying Pt. > 75%)  FIM - Bed/Chair Transfer Bed/Chair Transfer: 5: Supine > Sit: Supervision (verbal cues/safety issues);4: Bed > Chair or W/C: Min A (steadying Pt. > 75%);4: Chair or W/C > Bed: Min A (steadying Pt. > 75%)  FIM - Locomotion: Wheelchair Distance:  25' Locomotion: Wheelchair: 1: Travels less than 50 ft with minimal assistance (Pt.>75%) FIM - Locomotion: Ambulation Locomotion: Health visitor  Devices: Designer, industrial/product Ambulation/Gait Assistance: 4: Min assist Locomotion: Ambulation: 2: Travels 50 - 149 ft with minimal assistance (Pt.>75%)  Comprehension Comprehension Mode: Auditory Comprehension: 4-Understands basic 75 - 89% of the time/requires cueing 10 - 24% of the time  Expression Expression Mode: Verbal Expression: 3-Expresses basic 50 - 74% of the time/requires cueing 25 - 50% of the time. Needs to repeat parts of sentences.  Social Interaction Social Interaction: 4-Interacts appropriately 75 - 89% of the time - Needs redirection for appropriate language or to initiate interaction.  Problem Solving Problem Solving Mode: Not assessed Problem Solving: 3-Solves basic 50 - 74% of the time/requires cueing 25 - 49% of the time  Memory Memory: 3-Recognizes or recalls 50 - 74% of the time/requires cueing 25 - 49% of the time  1. Deconditioning/hypoxic encephalopathy/Dilantin toxicity  2. DVT Prophylaxis/Anticoagulation: Subcutaneous Lovenox. Monitor platelet counts any signs of bleeding  3. seizure disorder. Dilantin resumed 100 mg twice a day. Continue primidone 100 mg daily and 50 mg at bedtime. Dilantin level 6.0 04/04/2012 - recheck next week 4. Benign essential tremor. Inderal 80 mg twice a day. Patient has seen Dr. Sandria Manly in the past. Exam and gait are consistent with parkinson's disease in my opinion..  5. History of corneal transplant 3 months ago at [facility]. Will add saline drops for eye irritation. Needs to have eye gtts. 6. Hyponatremia. 120-126. Followup labs with Na 123 today. Continue to check serially 7. Recent catarct surgery- eye patch qhs. Eye drops for irritation/post op care  LOS (Days) 3 A FACE TO FACE EVALUATION WAS PERFORMED  Audrey Obrien 04/07/2012, 8:04 AM

## 2012-04-07 NOTE — Progress Notes (Signed)
[name] inquiring about Pred Forte ophthalmic drops. [name] will be bringing drops in from home on Saturday May 4. Informed to give to RN on duty to be labeled by pharmacy before pt use. [name] requested eye drops to be ordered from pharmacy. Pharmacy notified, and note has been made to order. Pt and husband informed drops will not be able to be ordered until Monday, May 6, and will come in on Monday afternoon or Tuesday, May 7, morning. No further concerns or questions at this time. Will continue to monitor. Wilson Singer, [place]

## 2012-04-07 NOTE — Progress Notes (Signed)
Speech Language Pathology Daily Session Note  Patient Details  Name: Audrey Obrien MRN: [id number] Date of Birth: Jul 06, 1931  Today's Date: 04/07/2012 Time: 7829-5621 Time Calculation (min): 45 min  Short Term Goals:  SLP Short Term Goal 1 (Week 1): Pt will consume trials of Dys. 3 and demonstrate efficient mastication with Min verbal cues SLP Short Term Goal 2 (Week 1): Pt will demonstrate selective attention in mildly distracting enviornment for ~10 mins with Mod verbal cues for redirection SLP Short Term Goal 3 (Week 1): Pt will intiate funtional tasks (grooming) with Min A verbal cues. SLP Short Term Goal 4 (Week 1): Pt will demonstrate functional problem solving with basic and familair tasks with Min A verbal and visual cues.  SLP Short Term Goal 5 (Week 1): pt will utilize external aids to increase recall of new, daily information with Min A verbal and semantic cues.   Skilled Therapeutic Interventions: Treatment focus on initiation, problem solving, and working memory. Pt limited by vision but was able to participate in game of "[place]" and was independent for identifying all colors and numbers. Pt required Mod A verbal and visual cues for initiation and Max A verbal cues for functional problem solving and recall of "rules" of card game. Pt requested to call her husband and required Mod A verbal cues to utilize external memory aid tor recall the telephone number. Dialed number independently with extra time and encouragement.   Daily Session FIM:  Comprehension Comprehension Mode: Auditory Comprehension: 3-Understands basic 50 - 74% of the time/requires cueing 25 - 50%  of the time Expression Expression Mode: Verbal Expression Assistive Devices:  (none) Expression: 3-Expresses basic 50 - 74% of the time/requires cueing 25 - 50% of the time. Needs to repeat parts of sentences. Social Interaction Social Interaction: 4-Interacts appropriately 75 - 89% of the time - Needs redirection for  appropriate language or to initiate interaction. Problem Solving Problem Solving: 3-Solves basic 50 - 74% of the time/requires cueing 25 - 49% of the time Memory Memory: 3-Recognizes or recalls 50 - 74% of the time/requires cueing 25 - 49% of the time FIM - Eating Eating Activity: 4: Help with managing cup/glass;3: Helper scoops food on utensil every scoop Pain Pain Assessment Pain Assessment: No/denies pain  Therapy/Group: Individual Therapy  Alani Sabbagh 04/07/2012, 4:24 PM

## 2012-04-07 NOTE — Progress Notes (Signed)
Physical Therapy Session Note  Patient Details  Name: Audrey Obrien MRN: [id number] Date of Birth: [birth year]  Today's Date: 04/07/2012 Time: 0730-0827 Time Calculation (min): 57 min  Short Term Goals: Week 1:  PT Short Term Goal 1 (Week 1): Pt will perform sit <> stand with min assist, min verbal cues for technique.  PT Short Term Goal 1 - Progress (Week 1): Progressing toward goal PT Short Term Goal 2 (Week 1): Pt ambulate 100' with least restricitive assistive device with min-guard assist in controlled environment.  PT Short Term Goal 2 - Progress (Week 1): Progressing toward goal PT Short Term Goal 3 (Week 1): Pt will ascend/descent 2 steps with one rail, min assist.  PT Short Term Goal 3 - Progress (Week 1): Progressing toward goal PT Short Term Goal 4 (Week 1): Pt will self propell wheelchair in home environment x 25' with up to min assist.  PT Short Term Goal 4 - Progress (Week 1): Progressing toward goal  Skilled Therapeutic Interventions/Progress Updates:  No c/o pain  Heel cord stretch for bil. LEs, cues to decrease plantarflexion resistance.   Gait training x 100' with RW, cues for posture increased cadence and stride length. Practiced modulated pelvis control working anterior/posterior tilt on dynadisk then mat incorporating into sit to stand. Seated rows with resistance band to promote upright thoracic posture. Dynamic reaching activities with horseshoes, min assist. Ambulation without device x 15'. Wheelchair mobility x 40' practicing turns, back rolling, forward propulsion. Min assist with max verbal cues.   Second session TIME: 1534- 1614  Total time: 40 min No c/o pain Gait training x 100' with RW. More fatigued this afternoon requiring more cues for safe RW position. Flexed posture more evident with fatigue. Repeated practice of anterior pelvic tilt and anterior translation of torso into standing, repeated verbal cues for UE placement. Pt with minimal recall of proper hand  placement at end of session although walked through problem solving with pt. She continues to try to pull up on RW. Gait training without RW x 70' with HHA, close min assist and increased instability noted. Stair training x 4 steps with moderate assist, cues for safe sequence and technique. 100' gait training with RW back to room, some difficulty with obstacle negotiation even though cued ahead of time. Memory is an issue with retention, will likely benefit having husband around for a few sessions.   Therapy Documentation Precautions:  Precautions Precautions: Fall Other Brace/Splint: Eye patch L when supine, lifting, or bending over (recent corneal implant surgery) Restrictions Weight Bearing Restrictions: No Other Position/Activity Restrictions: RN spoke with MD, pt ok to participate in therapies without patch.   Vital Signs: Therapy Vitals Temp: 98.1 F (36.7 C) Temp src: Oral Pulse Rate: 64  Resp: 18  BP: 145/69 mmHg Patient Position, if appropriate: Sitting Oxygen Therapy SpO2: 96 % O2 Device: None (Room air) Pain: Pain Assessment Pain Assessment: No/denies pain  See FIM for current functional status  Therapy/Group: Individual Therapy  [name] 04/07/2012, 5:26 PM

## 2012-04-08 DIAGNOSIS — Z5189 Encounter for other specified aftercare: Secondary | ICD-10-CM

## 2012-04-08 DIAGNOSIS — G931 Anoxic brain damage, not elsewhere classified: Secondary | ICD-10-CM

## 2012-04-08 NOTE — Progress Notes (Signed)
Speech Language Pathology Daily Session Note  Patient Details  Name: Audrey Obrien MRN: [id number] Date of Birth: Oct 30, 1931  Today's Date: 04/08/2012 Time: 0830-0900 Time Calculation (min): 30 min  Short Term Goals: Week 1:  SLP Short Term Goal 1 (Week 1): Pt will consume trials of Dys. 3 and demonstrate efficient mastication with Min verbal cues SLP Short Term Goal 2 (Week 1): Pt will demonstrate selective attention in mildly distracting enviornment for ~10 mins with Mod verbal cues for redirection SLP Short Term Goal 3 (Week 1): Pt will intiate funtional tasks (grooming) with Min A verbal cues. SLP Short Term Goal 4 (Week 1): Pt will demonstrate functional problem solving with basic and familair tasks with Min A verbal and visual cues.  SLP Short Term Goal 5 (Week 1): pt will utilize external aids to increase recall of new, daily information with Min A verbal and semantic cues.   Skilled Therapeutic Interventions: Treatment focused on trials of upgraded texture of mechanical soft with an overall functional oral phase.  Difficulties in manipulating the oral bolus arise when patient places 2 & 3 consecutive bites in oral cavity in a row without first completing the chew/swallow cycle of the previous bite.  Moderate verbal reminders for 1 bite at a time and for alternating bite/sip; "why do I need to do it this way?" patient asked and education was provided based on previous swallowing evaluations.    FIM:  Comprehension Comprehension Mode: Auditory Comprehension: 4-Understands basic 75 - 89% of the time/requires cueing 10 - 24% of the time Expression Expression Mode: Verbal Expression: 5-Expresses basic 90% of the time/requires cueing < 10% of the time. Social Interaction Social Interaction: 4-Interacts appropriately 75 - 89% of the time - Needs redirection for appropriate language or to initiate interaction. Problem Solving Problem Solving: 3-Solves basic 50 - 74% of the time/requires  cueing 25 - 49% of the time Memory Memory: 3-Recognizes or recalls 50 - 74% of the time/requires cueing 25 - 49% of the time FIM - Eating Eating Activity: 4: Helper occasionally scoops food on utensil;4: Helper occasionally brings food to mouth   Pain Pain Assessment Pain Assessment: No/denies pain Pain Score: 0-No pain  Therapy/Group: Individual Therapy  Myra Rude, M.S.,CCC-SLP Pager [phone] 04/08/2012, 9:11 AM

## 2012-04-08 NOTE — Progress Notes (Signed)
Occupational Therapy Session Note  Patient Details  Name: Audrey Obrien MRN: [id number] Date of Birth: [birth year]  Today's Date: 04/08/2012 Time: 1400-1500 and 1400-1500= total 105 minutes Time Calculation (min): 60 min  Skilled Therapeutic Interventions/Progress Updates: AM-   Toileting and  ADL in w/c at sink with focus on completing as much self care safely as possible to participate independence.  Though patient stated, "I have not done this before" she was able to wash her feet crossing legs to reach feet. Husband arrived at end of the AM session. PM- completed TAG group with focus on participation and transfers in ADL apartment     Therapy Documentation Precautions:  Precautions Precautions: Fall Other Brace/Splint: Eye patch L when supine, lifting, or bending over (recent corneal implant surgery) Restrictions Weight Bearing Restrictions: No Other Position/Activity Restrictions: RN spoke with MD, pt ok to participate in therapies without patch.   Pain: no c/os   See FIM for current functional status  Therapy/Group: Individual Therapy  Bud Face [facility] 04/08/2012, 4:47 PM

## 2012-04-08 NOTE — Progress Notes (Signed)
Patient ID: Audrey Obrien, female   DOB: [birth year], 78 y.o.   MRN: [id number] No new complaints. Up with therapies already today ROS otherwise negative  Objective: Vital Signs: Blood pressure 105/58, pulse 71, temperature 98.1 F (36.7 C), temperature source Oral, resp. rate 20, height 5\' 2"  (1.575 m), weight 49.6 kg (109 lb 5.6 oz), SpO2 92.00%. No results found. No results found for this basename: WBC:2,HGB:2,HCT:2,PLT:2 in the last 72 hours No results found for this basename: NA:2,K:2,CL:2,CO2:2,GLUCOSE:2,BUN:2,CREATININE:2,CALCIUM:2 in the last 72 hours CBG (last 3)  No results found for this basename: GLUCAP:3 in the last 72 hours  Wt Readings from Last 3 Encounters:  04/05/12 49.6 kg (109 lb 5.6 oz)  04/03/12 51.8 kg (114 lb 3.2 oz)  11/22/11 50.803 kg (112 lb)    Physical Exam:  General appearance: alert, cooperative, appears stated age and no distress Head: Normocephalic, without obvious abnormality, atraumatic Eyes:  left sclera appears less irritated Ears: normal  external ear canals both ears Nose: Nares normal. Septum midline. Mucosa normal. No drainage or sinus tenderness. Throat: lips, mucosa, and tongue normal; teeth and gums normal Neck: no adenopathy, no carotid bruit, no JVD, supple, symmetrical, trachea midline and thyroid not enlarged, symmetric, no tenderness/mass/nodules Back: symmetric, no curvature. ROM normal. No CVA tenderness. Resp: clear to auscultation bilaterally Cardio: regular rate and rhythm, S1, S2 normal, no murmur, click, rub or gallop GI: soft, non-tender; bowel sounds normal; no masses,  no organomegaly Extremities: extremities normal, atraumatic, no cyanosis or edema Pulses: 2+ and symmetric Skin: Skin color, texture, turgor normal. No rashes or lesions Neurologic: resting tremor, pill rolling at times, speech fluctuates, fairly alert. Moves all 4's.  (3-4/5). Walks with narrow base of support and shuffling gait. Incision/Wound: clean and  intact    Assessment/Plan: 1. Functional deficits secondary to seizure disorder/gait disorder. ?PD which require 3+ hours per day of interdisciplinary therapy in a comprehensive inpatient rehab setting. Physiatrist is providing close team supervision and 24 hour management of active medical problems listed below. Physiatrist and rehab team continue to assess barriers to discharge/monitor patient progress toward functional and medical goals. FIM: FIM - Bathing Bathing Steps Patient Completed: Chest;Right Arm;Left Arm;Abdomen;Front perineal area;Buttocks;Right upper leg;Left upper leg;Left lower leg (including foot) Bathing: 4: Min-Patient completes 8-9 34f 10 parts or 75+ percent  FIM - Upper Body Dressing/Undressing Upper body dressing/undressing steps patient completed: Thread/unthread right sleeve of pullover shirt/dresss;Thread/unthread right bra strap;Thread/unthread left bra strap;Thread/unthread left sleeve of pullover shirt/dress;Put head through opening of pull over shirt/dress Upper body dressing/undressing: 4: Min-Patient completed 75 plus % of tasks FIM - Lower Body Dressing/Undressing Lower body dressing/undressing steps patient completed: Thread/unthread right underwear leg;Pull underwear up/down;Thread/unthread right pants leg;Pull pants up/down;Don/Doff right sock;Don/Doff left sock Lower body dressing/undressing: 3: Mod-Patient completed 50-74% of tasks  FIM - Toileting Toileting steps completed by patient: Adjust clothing prior to toileting;Performs perineal hygiene Toileting Assistive Devices: Grab bar or rail for support Toileting: 3: Mod-Patient completed 2 of 3 steps  FIM - Diplomatic Services operational officer Devices: Psychiatrist Transfers: 4-To toilet/BSC: Min A (steadying Pt. > 75%);4-From toilet/BSC: Min A (steadying Pt. > 75%)  FIM - Bed/Chair Transfer Bed/Chair Transfer: 5: Supine > Sit: Supervision (verbal cues/safety issues);4: Bed > Chair or  W/C: Min A (steadying Pt. > 75%);4: Chair or W/C > Bed: Min A (steadying Pt. > 75%)  FIM - Locomotion: Wheelchair Distance: 25' Locomotion: Wheelchair: 1: Travels less than 50 ft with minimal assistance (Pt.>75%) FIM - Locomotion: Ambulation Locomotion:  Ambulation Assistive Devices: Walker - Rolling Ambulation/Gait Assistance: 4: Min assist Locomotion: Ambulation: 2: Travels 50 - 149 ft with minimal assistance (Pt.>75%)  Comprehension Comprehension Mode: Auditory Comprehension: 3-Understands basic 50 - 74% of the time/requires cueing 25 - 50%  of the time  Expression Expression Mode: Verbal Expression Assistive Devices:  (none) Expression: 3-Expresses basic 50 - 74% of the time/requires cueing 25 - 50% of the time. Needs to repeat parts of sentences.  Social Interaction Social Interaction: 4-Interacts appropriately 75 - 89% of the time - Needs redirection for appropriate language or to initiate interaction.  Problem Solving Problem Solving Mode: Not assessed Problem Solving: 3-Solves basic 50 - 74% of the time/requires cueing 25 - 49% of the time  Memory Memory: 3-Recognizes or recalls 50 - 74% of the time/requires cueing 25 - 49% of the time  1. Deconditioning/hypoxic encephalopathy/Dilantin toxicity  2. DVT Prophylaxis/Anticoagulation: Subcutaneous Lovenox. Monitor platelet counts any signs of bleeding  3. seizure disorder. Dilantin resumed 100 mg twice a day. Continue primidone 100 mg daily and 50 mg at bedtime. Dilantin level 6.0 04/04/2012 - recheck next week 4. Benign essential tremor. Inderal 80 mg twice a day. Patient has seen Dr. Sandria Manly in the past. Exam and gait are consistent with parkinson's disease in my opinion..  5. History of corneal transplant 3 months ago at [facility]. Will add saline drops for eye irritation. Needs to have eye gtts. 6. Hyponatremia. 120-126. Followup labs with Na 123 today. Continue to check serially 7. Recent catarct surgery- eye patch  qhs. Eye drops for irritation/post op care  LOS (Days) 4 A FACE TO FACE EVALUATION WAS PERFORMED  Khoury Siemon E 04/08/2012, 7:53 AM

## 2012-04-08 NOTE — Progress Notes (Signed)
Physical Therapy Session Note  Patient Details  Name: Audrey Obrien MRN: [id number] Date of Birth: [birth year]  Today's Date: 04/08/2012 Time: 4098-1191 Time Calculation (min): 45 min  Short Term Goals: Week 1:  PT Short Term Goal 1 (Week 1): Pt will perform sit <> stand with min assist, min verbal cues for technique.  PT Short Term Goal 1 - Progress (Week 1): Progressing toward goal PT Short Term Goal 2 (Week 1): Pt ambulate 100' with least restricitive assistive device with min-guard assist in controlled environment.  PT Short Term Goal 2 - Progress (Week 1): Progressing toward goal PT Short Term Goal 3 (Week 1): Pt will ascend/descent 2 steps with one rail, min assist.  PT Short Term Goal 3 - Progress (Week 1): Progressing toward goal PT Short Term Goal 4 (Week 1): Pt will self propell wheelchair in home environment x 25' with up to min assist.  PT Short Term Goal 4 - Progress (Week 1): Progressing toward goal  Skilled Therapeutic Interventions/Progress Updates:  Therapy Documentation Precautions:  Precautions Precautions: Fall Other Brace/Splint: Eye patch L when supine, lifting, or bending over (recent corneal implant surgery) Restrictions Weight Bearing Restrictions: No Other Position/Activity Restrictions: RN spoke with MD, pt ok to participate in therapies without patch.  Vital Signs: Therapy Vitals Temp: 98.1 F (36.7 C) Temp src: Oral Pulse Rate: 71  Resp: 20  BP: 105/58 mmHg Patient Position, if appropriate: Lying Oxygen Therapy SpO2: 92 % O2 Device: None (Room air) Pain: Pain Assessment Pain Assessment: No/denies pain Pain Score: 0-No pain Mobility: Therapeutic Activity (15')  Bed mobility; Supervised assist              Transfers; min-Assist from sit<->stand, and with toilet transfers  Locomotion : Gait Training (15')  Using RW with S/min-A x 50' inside room and to/from toilet (patient asking and needing verbal reassurance for activity)   Exercises:  Therapeutic Exercise (15')   B LE's in supine and in sitting  See FIM for current functional status  Therapy/Group: Individual Therapy  Jaydin Jalomo J 04/08/2012, 7:50 AM

## 2012-04-09 NOTE — Progress Notes (Signed)
Patient ID: KIMLA FURTH, female   DOB: 1931-08-25, 78 y.o.   MRN: [id number] No new complaints.  ROS otherwise negative  Objective: Vital Signs: Blood pressure 112/43, pulse 70, temperature 98.4 F (36.9 C), temperature source Oral, resp. rate 19, height 5\' 2"  (1.575 m), weight 49.6 kg (109 lb 5.6 oz), SpO2 95.00%. No results found. No results found for this basename: WBC:2,HGB:2,HCT:2,PLT:2 in the last 72 hours  CBG (last 3)    Wt Readings from Last 3 Encounters:  04/05/12 49.6 kg (109 lb 5.6 oz)  04/03/12 51.8 kg (114 lb 3.2 oz)  11/22/11 50.803 kg (112 lb)    Physical Exam:  General appearance: alert, cooperative, appears stated age and no distress Head: Normocephalic, without obvious abnormality, atraumatic Eyes:  left sclera appears less irritated Ears: normal  external ear canals both ears Nose: Nares normal. Septum midline. Mucosa normal. No drainage or sinus tenderness. Throat: lips, mucosa, and tongue normal; teeth and gums normal Neck: no adenopathy, no carotid bruit, no JVD, supple, symmetrical, trachea midline and thyroid not enlarged, symmetric, no tenderness/mass/nodules Back: symmetric, no curvature. ROM normal. No CVA tenderness. Resp: clear to auscultation bilaterally Cardio: regular rate and rhythm, S1, S2 normal, no murmur, click, rub or gallop GI: soft, non-tender; bowel sounds normal; no masses,  no organomegaly Extremities: extremities normal, atraumatic, no cyanosis or edema Pulses: 2+ and symmetric Skin: Skin color, texture, turgor normal. No rashes or lesions Neurologic: resting tremor, pill rolling at times, speech fluctuates, fairly alert. Moves all 4's.  (3-4/5). Walks with narrow base of support and shuffling gait. Incision/Wound: clean and intact    Assessment/Plan: 1. Functional deficits secondary to seizure disorder/gait disorder. ?PD which require 3+ hours per day of interdisciplinary therapy in a comprehensive inpatient rehab  setting. Physiatrist is providing close team supervision and 24 hour management of active medical problems listed below. Physiatrist and rehab team continue to assess barriers to discharge/monitor patient progress toward functional and medical goals. FIM: FIM - Bathing Bathing Steps Patient Completed: Chest;Right Arm;Left Arm;Abdomen;Front perineal area;Right upper leg;Left upper leg;Right lower leg (including foot);Left lower leg (including foot) Bathing: 4: Min-Patient completes 8-9 53f 10 parts or 75+ percent  FIM - Upper Body Dressing/Undressing Upper body dressing/undressing steps patient completed: Thread/unthread right bra strap;Thread/unthread left bra strap;Thread/unthread right sleeve of pullover shirt/dresss;Thread/unthread left sleeve of pullover shirt/dress;Put head through opening of pull over shirt/dress;Pull shirt over trunk Upper body dressing/undressing:  (moderate encouragement to complete self care) FIM - Lower Body Dressing/Undressing Lower body dressing/undressing steps patient completed: Thread/unthread right underwear leg;Pull underwear up/down;Thread/unthread right pants leg;Thread/unthread left pants leg;Don/Doff right sock;Don/Doff left sock Lower body dressing/undressing: 3: Mod-Patient completed 50-74% of tasks  FIM - Toileting Toileting steps completed by patient: Performs perineal hygiene;Adjust clothing prior to toileting Toileting Assistive Devices: Grab bar or rail for support Toileting: 3: Mod-Patient completed 2 of 3 steps  FIM - Diplomatic Services operational officer Devices: Grab bars Toilet Transfers: 4-To toilet/BSC: Min A (steadying Pt. > 75%);4-From toilet/BSC: Min A (steadying Pt. > 75%)  FIM - Bed/Chair Transfer Bed/Chair Transfer: 5: Supine > Sit: Supervision (verbal cues/safety issues);4: Bed > Chair or W/C: Min A (steadying Pt. > 75%);4: Chair or W/C > Bed: Min A (steadying Pt. > 75%)  FIM - Locomotion: Wheelchair Distance:  25' Locomotion: Wheelchair: 1: Travels less than 50 ft with minimal assistance (Pt.>75%) FIM - Locomotion: Ambulation Locomotion: Ambulation Assistive Devices: Designer, industrial/product Ambulation/Gait Assistance: 4: Min assist Locomotion: Ambulation: 2: Travels 50 - 149 ft  with minimal assistance (Pt.>75%)  Comprehension Comprehension Mode: Auditory Comprehension: 4-Understands basic 75 - 89% of the time/requires cueing 10 - 24% of the time  Expression Expression Mode: Verbal Expression Assistive Devices:  (none) Expression: 5-Expresses basic 90% of the time/requires cueing < 10% of the time.  Social Interaction Social Interaction: 4-Interacts appropriately 75 - 89% of the time - Needs redirection for appropriate language or to initiate interaction.  Problem Solving Problem Solving Mode: Not assessed Problem Solving: 3-Solves basic 50 - 74% of the time/requires cueing 25 - 49% of the time  Memory Memory: 3-Recognizes or recalls 50 - 74% of the time/requires cueing 25 - 49% of the time  1. Deconditioning/hypoxic encephalopathy/Dilantin toxicity  2. DVT Prophylaxis/Anticoagulation: Subcutaneous Lovenox. Monitor platelet counts any signs of bleeding  3. seizure disorder. Dilantin resumed 100 mg twice a day. Continue primidone 100 mg daily and 50 mg at bedtime. Dilantin level 6.0 04/04/2012 - recheck next week 4. Benign essential tremor. Inderal 80 mg twice a day. Patient has seen Dr. Sandria Manly in the past. Exam and gait are consistent with parkinson's disease in my opinion..  5. History of corneal transplant 3 months ago at [facility]. Will add saline drops for eye irritation. Needs to have eye gtts. 6. Hyponatremia. 120-126. Followup labs with Na 123 today. Continue to check serially 7. Recent catarct surgery- eye patch qhs. Eye drops for irritation/post op care  LOS (Days) 5 A FACE TO FACE EVALUATION WAS PERFORMED  Jen Benedict E 04/09/2012, 9:07 AM

## 2012-04-09 NOTE — Progress Notes (Signed)
Occupational Therapy Session Note  Patient Details  Name: Audrey Obrien MRN: [id number] Date of Birth: 05-04-1931  Today's Date: 04/09/2012 Time: 1400-1500 Time Calculation (min): 60 min  Short Term Goals: Week 1:  OT Short Term Goal 1 (Week 1): Patient will dress upper body with minimal assistance OT Short Term Goal 2 (Week 1): Patient will dress lower body with minimal assistance OT Short Term Goal 3 (Week 1): Patient will bathe herself with minimal assistance OT Short Term Goal 4 (Week 1): Patient will complete toileting with moderate assistance OT Short Term Goal 5 (Week 1): Patient will complete grooming tasks with set up and cueing.  Skilled Therapeutic Interventions/Progress Updates: Patient participated in Therapeutic Activities Group this date with emphasis on patient education, community support, physical equipment, w/c propulsion, self-assessments, and use of electronic gaming system (Wii) to facilitate improved gross/fine motor coordination and dynamic sitting balance.   Patient required hand-over-hand instruction on use of w/c and improved by the end of session.  Patient able to complete 3 of 5 Wii remote functions during bowling game.   Therapy Documentation Precautions:  Precautions Precautions: Fall Other Brace/Splint: Eye patch L when supine, lifting, or bending over (recent corneal implant surgery) Restrictions Weight Bearing Restrictions: No Other Position/Activity Restrictions: RN spoke with MD, pt ok to participate in therapies without patch.   See FIM for current functional status  Therapy/Group: Group Therapy  Georgeanne Nim 04/09/2012, 3:59 PM

## 2012-04-10 DIAGNOSIS — Z5189 Encounter for other specified aftercare: Secondary | ICD-10-CM

## 2012-04-10 DIAGNOSIS — G931 Anoxic brain damage, not elsewhere classified: Secondary | ICD-10-CM

## 2012-04-10 MED ORDER — PREDNISOLONE ACETATE 1 % OP SUSP
1.0000 [drp] | Freq: Four times a day (QID) | OPHTHALMIC | Status: DC
  Administered 2012-04-11 – 2012-04-19 (×32): 1 [drp] via OPHTHALMIC
  Filled 2012-04-10 (×4): qty 1

## 2012-04-10 NOTE — Progress Notes (Addendum)
Physical Therapy Session Note  Patient Details  Name: Audrey Obrien MRN: [id number] Date of Birth: 08/14/1931  Today's Date: 04/10/2012 Time: 0810-0855 Time Calculation (min): 45 min  Short Term Goals: Week 1: PT Short Term Goal 1 (Week 1): Pt will perform sit <> stand with min assist, min verbal cues for technique.  PT Short Term Goal 1 - Progress (Week 1): Progressing toward goal  PT Short Term Goal 2 (Week 1): Pt ambulate 100' with least restricitive assistive device with min-guard assist in controlled environment.  PT Short Term Goal 2 - Progress (Week 1): Progressing toward goal  PT Short Term Goal 3 (Week 1): Pt will ascend/descent 2 steps with one rail, min assist.  PT Short Term Goal 3 - Progress (Week 1): Progressing toward goal  PT Short Term Goal 4 (Week 1): Pt will self propell wheelchair in home environment x 25' with up to min assist.  PT Short Term Goal 4 - Progress (Week 1): Progressing toward goal  Skilled Therapeutic Interventions/Progress Updates:    The patient is progressing well with mobility, but still needs almost constant cues for safety for every task, but especially for sit to stand.  Sit <-> stand practiced multiple times throughout the treatment session with max verbal cues for safe hand placement.  Supervision for the actual activity.  Gait with RW 180' with WC to follow x2, dynamic sitting balance and standing balance with one hand propped at all times to keep from falling supervision during functional task (putting on shoes and socks and pulling up pants, washing hands at sink), there ex: seated heel and toe raises bil x12, LAQs bil x 12, seated marches bil x 12, seated hip flexion bil x 12, sit to stands x 10 reps max cues for safe hand placement.    Therapy Documentation Precautions:  Precautions Precautions: Fall Other Brace/Splint: Eye patch L when supine, lifting, or bending over (recent corneal implant surgery) Restrictions Weight Bearing Restrictions:  No Other Position/Activity Restrictions: RN spoke with MD, pt ok to participate in therapies without patch.  General: Amount of Missed PT Time (min): 15 Minutes Missed Time Reason: Other (comment) (had not eaten wanted to get dressed before therapy) See FIM for current functional status  Therapy/Group: Individual Therapy  [name], PT, DPT [phone]   04/10/2012, 12:28 PM

## 2012-04-10 NOTE — Progress Notes (Addendum)
Physical Therapy Session Note  Patient Details  Name: Audrey Obrien MRN: [id number] Date of Birth: 1931-02-14  Today's Date: 04/10/2012 Time: 4098-1191 Time Calculation (min): 30 min  Short Term Goals: Week 1:  PT Short Term Goal 1 (Week 1): Pt will perform sit <> stand with min assist, min verbal cues for technique.  PT Short Term Goal 1 - Progress (Week 1): Progressing toward goal PT Short Term Goal 2 (Week 1): Pt ambulate 100' with least restricitive assistive device with min-guard assist in controlled environment.  PT Short Term Goal 2 - Progress (Week 1): Progressing toward goal PT Short Term Goal 3 (Week 1): Pt will ascend/descent 2 steps with one rail, min assist.  PT Short Term Goal 3 - Progress (Week 1): Progressing toward goal PT Short Term Goal 4 (Week 1): Pt will self propell wheelchair in home environment x 25' with up to min assist.  PT Short Term Goal 4 - Progress (Week 1): Progressing toward goal  Skilled Therapeutic Interventions/Progress Updates:  The patient continues to need cues for safety while getting up and down out of the chair. There seems to be very little carryover from one session to another for safety for sit to stand.  We may need to increase spouse education to make sure this is reinforced at home (he is not present for today's two sessions).  Sit<->stand with min assist to stabilize the RW and max verbal cues for hand placement.  Ambulation >500' total with RW, supervision with WC to follow, but not needed.  Verbal cues for upright posture.  Car transfers min assist with max verbal cues for sequence and safety.  Simulated SUV with running board.  Stairs with 2 rails min assist step-to pattern 4-6" steps.  Flexed knees during descent.  There ex: sit to stand x 10, standing toe raises holding to RW x 10, standing hip flexion/marching holding to RW x 10 bil, standing hip abduction with manual and verbal cues for technique holding to RW x 10.    Therapy  Documentation Precautions:  Precautions Precautions: Fall Other Brace/Splint: Eye patch L when supine, lifting, or bending over (recent corneal implant surgery) Restrictions Weight Bearing Restrictions: No Other Position/Activity Restrictions: RN spoke with MD, pt ok to participate in therapies without patch.    Vital Signs:  no distress, none taken Pain: Pain Assessment Pain Assessment: No/denies pain Pain Score: 0-No pain Locomotion : Ambulation Ambulation/Gait Assistance: 5: Supervision  See FIM for current functional status  Therapy/Group: Individual Therapy  Lurena Joiner B. Nataliya Graig, PT, DPT [phone]   04/10/2012, 3:47 PM

## 2012-04-10 NOTE — Care Management Note (Signed)
Per State Regulation 482.30 This chart was reviewed for medical necessity with respect to the patient's Admission/Duration of stay. Pt participating & progressing txs.  She does need almost constant cuing.   Brock Ra                 Nurse Care Manager              Next Review Date: 04/13/12

## 2012-04-10 NOTE — Progress Notes (Signed)
Speech Language Pathology Daily Session Note  Patient Details  Name: Audrey Obrien MRN: [id number] Date of Birth: 11-28-1931  Today's Date: 04/10/2012 Time: 0900-1000 Time Calculation (min): 60 min  Short Term Goals: SLP Short Term Goal 1 (Week 1): Pt will consume trials of Dys. 3 and demonstrate efficient mastication with Min verbal cues (progressing) SLP Short Term Goal 2 (Week 1): Pt will demonstrate selective attention in mildly distracting enviornment for ~10 mins with Mod verbal cues for redirection (progressing) SLP Short Term Goal 3 (Week 1): Pt will intiate funtional tasks (grooming) with Min A verbal cues. (progressing) SLP Short Term Goal 4 (Week 1): Pt will demonstrate functional problem solving with basic and familair tasks with Min A verbal and visual cues. (progressing) SLP Short Term Goal 5 (Week 1): pt will utilize external aids to increase recall of new, daily information with Min A verbal and semantic cues. (progressing)  Skilled Therapeutic Interventions: Treatment focused on dysphagia goals, working memory, and problem solving. Patient able to consume dysphagia 3 diet with thin liquids with mod assist for use of compensatory strategies to alternate bite and sip and demonstrate efficient mastication of bolus. Moderate SLP verbal cueing provided for initiation of functional self feeding task and functional problem solving with patient frequently stating "I can't do this."  Patient will continue to benefit from treatment with focus on above goals.   Daily Session Precautions/Restrictions  Precautions Precautions: Fall FIM:  Comprehension Comprehension Mode: Auditory Comprehension: 4-Understands basic 75 - 89% of the time/requires cueing 10 - 24% of the time Expression Expression Mode: Verbal Expression: 5-Expresses basic 90% of the time/requires cueing < 10% of the time. Social Interaction Social Interaction: 4-Interacts appropriately 75 - 89% of the time - Needs  redirection for appropriate language or to initiate interaction. Problem Solving Problem Solving: 4-Solves basic 75 - 89% of the time/requires cueing 10 - 24% of the time Memory Memory: 3-Recognizes or recalls 50 - 74% of the time/requires cueing 25 - 49% of the time General    Pain Pain Assessment Pain Assessment: No/denies pain CTherapy/Group: Individual Therapy  Ferdinand Lango MA, CCC-SLP [phone]   Audrey Obrien 04/10/2012, 1:00 PM

## 2012-04-10 NOTE — Progress Notes (Signed)
Occupational Therapy Session Note  Patient Details  Name: Audrey Obrien MRN: [id number] Date of Birth: 1931/09/02  Today's Date: 04/10/2012 Time: 4098-1191 Time Calculation (min): 44 min     Skilled Therapeutic Interventions/Progress Updates:    Pt seen today for individual OT treatment session with focus on increased independence for ADL and self care tasks related to bathing and dressing. Pt declined shower this am, stating that she only showers "every other day due to dry skin". Pt was agreeable to B/D @ sink level however. Overall, pt was Min A UB and LB ADL's, she benefits from increased time and encouragement to participate/initiate tasks noted. She required VC's for safety especially with sit-stand tasks at sink during LB bathing (often bending over too far and/or trying to hold onto sink awkwardly). Ted hose applied to LE's per sticky note in chart.  Therapy Documentation Precautions:  Precautions Precautions: Fall Other Brace/Splint: Eye patch L when supine, lifting, or bending over (recent corneal implant surgery) Restrictions Weight Bearing Restrictions: No Other Position/Activity Restrictions: RN spoke with MD, pt ok to participate in therapies without patch.    Pain: Pain Assessment Pain Assessment: No/denies pain       See FIM for current functional status  Therapy/Group: Individual Therapy  Alm Bustard 04/10/2012, 1:08 PM

## 2012-04-10 NOTE — Progress Notes (Signed)
Patient ID: Audrey Obrien, female   DOB: May 15, 1931, 78 y.o.   MRN: [id number] Patient ID: Audrey Obrien, female   DOB: 05/29/1931, 78 y.o.   MRN: [id number] Complains of feet being"discolored" when sitting up ROS otherwise negative  Objective: Vital Signs: Blood pressure 122/67, pulse 69, temperature 97.9 F (36.6 C), temperature source Oral, resp. rate 19, height 5\' 2"  (1.575 m), weight 49.6 kg (109 lb 5.6 oz), SpO2 97.00%. No results found. No results found for this basename: WBC:2,HGB:2,HCT:2,PLT:2 in the last 72 hours  CBG (last 3)    Wt Readings from Last 3 Encounters:  04/05/12 49.6 kg (109 lb 5.6 oz)  04/03/12 51.8 kg (114 lb 3.2 oz)  11/22/11 50.803 kg (112 lb)    Physical Exam:  General appearance: alert, cooperative, appears stated age and no distress Head: Normocephalic, without obvious abnormality, atraumatic Eyes:  left sclera appears improved. Prosthetic right eye Ears: normal  external ear canals both ears Nose: Nares normal. Septum midline. Mucosa normal. No drainage or sinus tenderness. Throat: lips, mucosa, and tongue normal; teeth and gums normal Neck: no adenopathy, no carotid bruit, no JVD, supple, symmetrical, trachea midline and thyroid not enlarged, symmetric, no tenderness/mass/nodules Back: symmetric, no curvature. ROM normal. No CVA tenderness. Resp: clear to auscultation bilaterally Cardio: regular rate and rhythm, S1, S2 normal, no murmur, click, rub or gallop GI: soft, non-tender; bowel sounds normal; no masses,  no organomegaly Extremities: extremities normal, atraumatic, no cyanosis or edema Pulses: 2+ and symmetric Skin: Skin color, texture, turgor normal. No rashes or lesions Neurologic: resting tremor, pill rolling at times, speech fluctuates, fairly alert. Moves all 4's.  (3-4/5). Walks with narrow base of support and shuffling gait. Incision/Wound: clean and intact    Assessment/Plan: 1. Functional deficits secondary to seizure disorder/gait  disorder. ?PD which require 3+ hours per day of interdisciplinary therapy in a comprehensive inpatient rehab setting. Physiatrist is providing close team supervision and 24 hour management of active medical problems listed below. Physiatrist and rehab team continue to assess barriers to discharge/monitor patient progress toward functional and medical goals. FIM: FIM - Bathing Bathing Steps Patient Completed: Chest;Right Arm;Left Arm;Abdomen;Front perineal area;Right upper leg;Left upper leg;Right lower leg (including foot);Left lower leg (including foot) Bathing: 4: Min-Patient completes 8-9 41f 10 parts or 75+ percent  FIM - Upper Body Dressing/Undressing Upper body dressing/undressing steps patient completed: Thread/unthread right bra strap;Thread/unthread left bra strap;Thread/unthread right sleeve of pullover shirt/dresss;Thread/unthread left sleeve of pullover shirt/dress;Put head through opening of pull over shirt/dress;Pull shirt over trunk Upper body dressing/undressing:  (moderate encouragement to complete self care) FIM - Lower Body Dressing/Undressing Lower body dressing/undressing steps patient completed: Thread/unthread right underwear leg;Pull underwear up/down;Thread/unthread right pants leg;Thread/unthread left pants leg;Don/Doff right sock;Don/Doff left sock Lower body dressing/undressing: 3: Mod-Patient completed 50-74% of tasks  FIM - Toileting Toileting steps completed by patient: Performs perineal hygiene;Adjust clothing prior to toileting Toileting Assistive Devices: Grab bar or rail for support Toileting: 3: Mod-Patient completed 2 of 3 steps  FIM - Diplomatic Services operational officer Devices: Grab bars Toilet Transfers: 4-To toilet/BSC: Min A (steadying Pt. > 75%);4-From toilet/BSC: Min A (steadying Pt. > 75%)  FIM - Bed/Chair Transfer Bed/Chair Transfer: 5: Supine > Sit: Supervision (verbal cues/safety issues);4: Bed > Chair or W/C: Min A (steadying Pt. >  75%);4: Chair or W/C > Bed: Min A (steadying Pt. > 75%)  FIM - Locomotion: Wheelchair Distance: 25' Locomotion: Wheelchair: 1: Travels less than 50 ft with minimal assistance (Pt.>75%) FIM - Locomotion:  Ambulation Locomotion: Ambulation Assistive Devices: Walker - Rolling Ambulation/Gait Assistance: 4: Min assist Locomotion: Ambulation: 2: Travels 50 - 149 ft with minimal assistance (Pt.>75%)  Comprehension Comprehension Mode: Auditory Comprehension: 4-Understands basic 75 - 89% of the time/requires cueing 10 - 24% of the time  Expression Expression Mode: Verbal Expression Assistive Devices:  (none) Expression: 5-Expresses basic 90% of the time/requires cueing < 10% of the time.  Social Interaction Social Interaction: 4-Interacts appropriately 75 - 89% of the time - Needs redirection for appropriate language or to initiate interaction.  Problem Solving Problem Solving Mode: Not assessed Problem Solving: 3-Solves basic 50 - 74% of the time/requires cueing 25 - 49% of the time  Memory Memory: 3-Recognizes or recalls 50 - 74% of the time/requires cueing 25 - 49% of the time  1. Deconditioning/hypoxic encephalopathy/Dilantin toxicity  2. DVT Prophylaxis/Anticoagulation: Subcutaneous Lovenox. Monitor platelet counts any signs of bleeding   -explained to patient that discoloration is only blood pooling when sitting up. Legs look fine 3. seizure disorder. Dilantin resumed 100 mg twice a day. Continue primidone 100 mg daily and 50 mg at bedtime. Dilantin level 6.0 04/04/2012 - recheck labs wednesday 4. Benign essential tremor. Inderal 80 mg twice a day. Patient has seen Dr. Sandria Manly in the past. Exam and gait are consistent with parkinson's disease in my opinion..  5. History of corneal transplant 3 months ago at [facility]. Will add saline drops for eye irritation. Needs to have eye gtts. 6. Hyponatremia. 120-126. Followup labs with Na 123 today. Continue to check serially 7. Recent  catarct surgery- eye patch qhs. Eye drops for irritation/post op care  LOS (Days) 6 A FACE TO FACE EVALUATION WAS PERFORMED  SWARTZ,ZACHARY T 04/10/2012, 6:48 AM

## 2012-04-10 NOTE — Progress Notes (Signed)
Recreational Therapy Session Note  Patient Details  Name: Audrey Obrien MRN: [id number] Date of Birth: Jan 22, 1931 Today's Date: 04/10/2012  No formal TR treatment plan implemented at this time.  Will continue to monitor through team. Napoleon Monacelli 04/10/2012, 4:24 PM

## 2012-04-11 DIAGNOSIS — Z5189 Encounter for other specified aftercare: Secondary | ICD-10-CM

## 2012-04-11 DIAGNOSIS — G931 Anoxic brain damage, not elsewhere classified: Secondary | ICD-10-CM

## 2012-04-11 NOTE — Progress Notes (Addendum)
Physical Therapy Weekly Progress Note  Patient Details  Name: Audrey Obrien MRN: [id number] Date of Birth: 04-Nov-1931  Today's Date: 04/11/2012  Patient has met 3 of 4 short term goals.  Pt did not meet wheelchair goal and is not anticipated to need to use a wheelchair at home, will D/C long term wheelchair goal.   Patient continues to demonstrate the following deficits: Decreased memory, decreased safety with transitions and mobility, increased need for assistance, and impaired balance and therefore will continue to benefit from skilled PT intervention to enhance overall performance with activity tolerance, balance and awareness and decrease risk for falls.  Patient progressing toward long term goals..  Continue plan of care.  PT Short Term Goals Week 1:  PT Short Term Goal 1 (Week 1): Pt will perform sit <> stand with min assist, min verbal cues for technique.  PT Short Term Goal 1 - Progress (Week 1): Met PT Short Term Goal 2 (Week 1): Pt ambulate 100' with least restricitive assistive device with min-guard assist in controlled environment.  PT Short Term Goal 2 - Progress (Week 1): Met PT Short Term Goal 3 (Week 1): Pt will ascend/descent 2 steps with one rail, min assist.  PT Short Term Goal 3 - Progress (Week 1): Met PT Short Term Goal 4 (Week 1): Pt will self propell wheelchair in home environment x 25' with up to min assist.  PT Short Term Goal 4 - Progress (Week 1): Not met  Time: 1301-1330 Time Calculation (min): 29 min   Skilled Therapeutic Interventions/Progress Updates:    Wheelchair training x 15' in room with negotiating into bathroom. Pt requires max verbal cues to negotiate, do not feel that wheelchair should be used in the house and will D/C wheelchair goal. Transfer to 3n1, min-guard assist with max verbal cues for safety and UE positioning. Pt able to perform perineal care by self. Gait training in controlled environment 2 x 140' with RW, min-guard to supervision at  times. Verbal cues for posture and RW distance. Repeated sit <> stands with supervision again for repetition, pt demonstrating more retention throughout session for safe UE placement.Practiced obstacle negotiation with min assist secondary to impaired balance and impaired ability to negotiate obstacles. Performed side stepping through narrow space, min assist to guide movement and pt slightly anxious with new task. Increased time needed with tasks to allow for pt to problem solve.   Therapy Documentation Precautions:  Precautions Precautions: Fall Other Brace/Splint: Eye patch L when supine, lifting, or bending over (recent corneal implant surgery) Restrictions Weight Bearing Restrictions: No Other Position/Activity Restrictions: RN spoke with MD, pt ok to participate in therapies without patch.  Pain: Pain Assessment Pain Assessment: No/denies pain Locomotion : Ambulation Ambulation/Gait Assistance: 5: Supervision Wheelchair Mobility Distance: 15'   See FIM for current functional status  Therapy/Group: Individual Therapy  [name] 04/11/2012, 2:42 PM

## 2012-04-11 NOTE — Progress Notes (Signed)
Speech Language Pathology Daily Session Note  Patient Details  Name: JAALA BOHLE MRN: [id number] Date of Birth: Dec 02, 1931  Today's Date: 04/11/2012 Time: 0900-1000 Time Calculation (min): 60 min  Short Term Goals: Week 1: SLP Short Term Goal 1 (Week 1): Pt will consume trials of Dys. 3 and demonstrate efficient mastication with Min verbal cues SLP Short Term Goal 1 - Progress (Week 1): Met SLP Short Term Goal 2 (Week 1): Pt will demonstrate selective attention in mildly distracting enviornment for ~10 mins with Mod verbal cues for redirection SLP Short Term Goal 2 - Progress (Week 1): Progressing toward goal SLP Short Term Goal 3 (Week 1): Pt will intiate funtional tasks (grooming) with Min A verbal cues. SLP Short Term Goal 3 - Progress (Week 1): Progressing toward goal SLP Short Term Goal 4 (Week 1): Pt will demonstrate functional problem solving with basic and familair tasks with Min A verbal and visual cues.  SLP Short Term Goal 4 - Progress (Week 4): Progressing toward goal SLP Short Term Goal 5 (Week 1): pt will utilize external aids to increase recall of new, daily information with Min A verbal and semantic cues.  SLP Short Term Goal 5 - Progress (Week 1): Progressing toward goal  Skilled Therapeutic Interventions: Treatment focused on use of compensatory swallowing strategies, diet tolerance, problem solving, memory, and task initiation. Patient requires max assist to utilize compensatory memory aids to increase functional recall of information and max assist to recall swallowing precautions however able to utilize with supervision during am meal once initial verbal reminder provided by clinician. Able to demonstrate functional problem solving during functional self care ADLs with supervision. Initiated wheelchair propulsion and self grooming task with minimal verbal encouragement as patient often underestimated abilities stating "I cant do that because of my tremors". Patient making  good progress with goals. Will continue to f/u.   Daily Session Precautions/Restrictions    FIM:  Comprehension Comprehension: 5-Follows basic conversation/direction: With extra time/assistive device Expression Expression: 5-Expresses complex 90% of the time/cues < 10% of the time Social Interaction Social Interaction: 4-Interacts appropriately 75 - 89% of the time - Needs redirection for appropriate language or to initiate interaction. Problem Solving Problem Solving: 4-Solves basic 75 - 89% of the time/requires cueing 10 - 24% of the time Memory Memory: 3-Recognizes or recalls 50 - 74% of the time/requires cueing 25 - 49% of the time General    Pain Pain Assessment Pain Assessment: No/denies pain Cognition:   Oral/Motor: Oral Motor/Sensory Function Overall Oral Motor/Sensory Function: Appears within functional limits for tasks assessed Motor Speech Overall Motor Speech: Impaired Respiration: Within functional limits Phonation:  (disfluent due to rigidity/tremors ) Resonance: Within functional limits Articulation: Within functional limitis Intelligibility: Intelligibility reduced Sentence: 75-100% accurate Motor Planning: Witnin functional limits Comprehension: Auditory Comprehension Overall Auditory Comprehension: Impaired Yes/No Questions: Impaired Complex Questions: 50-74% accurate Commands: Impaired Two Step Basic Commands: 50-74% accurate Conversation: Simple Interfering Components: Attention;Anxiety;Visual impairments;Processing speed;Working Radio broadcast assistant: Producer, television/film/video time;Repetition Reading Comprehension Reading Status: Not tested (due to vision) Expression: Expression Primary Mode of Expression: Verbal Verbal Expression Initiation: Impaired Automatic Speech: Name;Social Response Level of Generative/Spontaneous Verbalization: Sentence Repetition: No impairment Naming: No impairment Pragmatics: No impairment Interfering Components:  Attention Written Expression Dominant Hand: Right Written Expression: Not tested  Therapy/Group: Individual Therapy  Mariaguadalupe Fialkowski [name] 04/11/2012, 11:44 AM

## 2012-04-11 NOTE — Progress Notes (Signed)
Physical Therapy Session Note  Patient Details  Name: Audrey Obrien MRN: [id number] Date of Birth: Jun 29, 1931  Today's Date: 04/11/2012 Time: 0730-0824 Time Calculation (min): 54 min  Short Term Goals: Week 1:  PT Short Term Goal 1 (Week 1): Pt will perform sit <> stand with min assist, min verbal cues for technique.  PT Short Term Goal 1 - Progress (Week 1): Progressing toward goal PT Short Term Goal 2 (Week 1): Pt ambulate 100' with least restricitive assistive device with min-guard assist in controlled environment.  PT Short Term Goal 2 - Progress (Week 1): Progressing toward goal PT Short Term Goal 3 (Week 1): Pt will ascend/descent 2 steps with one rail, min assist.  PT Short Term Goal 3 - Progress (Week 1): Progressing toward goal PT Short Term Goal 4 (Week 1): Pt will self propell wheelchair in home environment x 25' with up to min assist.  PT Short Term Goal 4 - Progress (Week 1): Progressing toward goal  Skilled Therapeutic Interventions/Progress Updates:    Gait with RW x >350' total during session with supervision. Able to ambulate 150' at a time without need for rest. Verbal cues for upright posture and RW positioning. Pt improving with obstacle negotiation. Sit<> stand 2 x 20 reps for strengthening and to practice hand positioning. Pt with better intersession retention of safe UE placement with sit<>stand however still required verbal cues. 6" Stairs x 6 reps with one rail, min assist for descent. Verbal cues for safety throughout. Car transfer simulated SUV height with running board, min assist and max verbal cues for hand placement and safety. Dynamic balance activity with forwards and across midline reaching tasks without UE support, min-guard assist. Pt needs encouragement to practice in limits of stability as she is fearful.     Therapy Documentation Precautions:  Precautions Precautions: Fall Other Brace/Splint: Eye patch L when supine, lifting, or bending over (recent  corneal implant surgery) Restrictions Weight Bearing Restrictions: No Other Position/Activity Restrictions: RN spoke with MD, pt ok to participate in therapies without patch.  Vital Signs: Therapy Vitals Temp: 97.9 F (36.6 C) Temp src: Oral Pulse Rate: 78  Resp: 17  BP: 138/74 mmHg Patient Position, if appropriate: Lying Oxygen Therapy SpO2: 93 % O2 Device: None (Room air) Pain: Pain Assessment Pain Assessment: No/denies pain  See FIM for current functional status  Therapy/Group: Individual Therapy  [name] 04/11/2012, 9:01 AM

## 2012-04-11 NOTE — Progress Notes (Signed)
Nutrition Follow-up  Intake improved, pt eating 80 - 95% of meals.  Diet Order:  Dysphagia 3 with thin liquids Supplements: Ensure Pudding PO BID, Carnation Instant Breakfast daily  Meds: Scheduled Meds:   . antiseptic oral rinse  15 mL Mouth Rinse BID  . aspirin EC  81 mg Oral QHS  . cholecalciferol  1,000 Units Oral Daily  . enoxaparin  40 mg Subcutaneous Q24H  . feeding supplement  1 Container Oral BID BM  . magic mouthwash  5 mL Oral TID AC & HS  . pantoprazole  80 mg Oral Q1200  . phenytoin  100 mg Oral BID  . prednisoLONE acetate  1 drop Left Eye QID  . prednisoLONE acetate  1 drop Left Eye QID  . primidone  100 mg Oral Daily  . primidone  50 mg Oral QHS  . propranolol ER  80 mg Oral BID  . zinc oxide   Topical BID   Continuous Infusions:  PRN Meds:.acetaminophen, HYDROcodone-acetaminophen, naphazoline-pheniramine, ondansetron (ZOFRAN) IV, ondansetron, polyethylene glycol, sorbitol  Labs:  CMP     Component Value Date/Time   NA 123* 04/05/2012 0620   K 3.9 04/05/2012 0620   CL 92* 04/05/2012 0620   CO2 24 04/05/2012 0620   GLUCOSE 83 04/05/2012 0620   BUN 17 04/05/2012 0620   CREATININE 0.64 04/05/2012 0620   CALCIUM 8.5 04/05/2012 0620   PROT 5.3* 04/05/2012 0620   ALBUMIN 2.5* 04/05/2012 0620   AST 27 04/05/2012 0620   ALT 14 04/05/2012 0620   ALKPHOS 102 04/05/2012 0620   BILITOT 0.3 04/05/2012 0620   GFRNONAA 83* 04/05/2012 0620   GFRAA >90 04/05/2012 0620     Intake/Output Summary (Last 24 hours) at 04/11/12 1123 Last data filed at 04/11/12 1000  Gross per 24 hour  Intake    480 ml  Output      0 ml  Net    480 ml    Weight Status:  54.1 kg - wt up approx 10 lb x 1 week  Estimated needs:  1200 - 1400 kcal, 58 - 68 grams protein  Nutrition Dx:  Inadequate oral intake r/t fluctuating appetite and swallowing issues AEB declining weight and decreased PO intake. Resolved.  Goal:  Pt to consume >/= 50% of meals. Met.  Intervention:  Continue current interventions.  Monitor:   Weights, labs, PO intake  Adair Laundry Pager #:  [phone]

## 2012-04-11 NOTE — Progress Notes (Signed)
Occupational Therapy Session Note  Patient Details  Name: Audrey Obrien MRN: [id number] Date of Birth: 05-03-1931  Today's Date: 04/11/2012 Time: 0865-7846 Time Calculation (min): 49 min     Skilled Therapeutic Interventions/Progress Updates:    Pt seen today for individual OT treatment session with focus on ADL retraining & task initiation during ADL and self care. Pt requires Min-mod VC's for task initiation. Pt was noted to wait after using toilet & when prompted to perform peri care, stated "I'm done, you can do it", pt did perform after VC's to do so however. Similiarly, pt waited at sink when instructed to comb her hair, when prompted with "What do you need to do next?", pt stated "You can do it", however, pt was Mod I combing her hair after VC's to try. Cont w/ OT towards POC goals.   Therapy Documentation Precautions:  Precautions Precautions: Fall Other Brace/Splint: Eye patch L when supine, lifting, or bending over (recent corneal implant surgery) Restrictions Weight Bearing Restrictions: No Other Position/Activity Restrictions: RN spoke with MD, pt ok to participate in therapies without patch.      Pain: Pain Assessment Pain Assessment: No/denies pain     See FIM for current functional status  Therapy/Group: Individual Therapy  Alm Bustard 04/11/2012, 1:35 PM

## 2012-04-11 NOTE — Progress Notes (Signed)
Patient ID: Audrey Obrien, female   DOB: 1931/10/19, 78 y.o.   MRN: [id number] Patient ID: Audrey Obrien, female   DOB: Dec 23, 1930, 78 y.o.   MRN: [id number] Patient ID: Audrey Obrien, female   DOB: 05/09/1931, 78 y.o.   MRN: [id number] Good night. Slept well. Already up with PT today ROS otherwise negative  Objective: Vital Signs: Blood pressure 138/74, pulse 78, temperature 97.9 F (36.6 C), temperature source Oral, resp. rate 17, height 5\' 2"  (1.575 m), weight 54.1 kg (119 lb 4.3 oz), SpO2 93.00%. No results found. No results found for this basename: WBC:2,HGB:2,HCT:2,PLT:2 in the last 72 hours  CBG (last 3)    Wt Readings from Last 3 Encounters:  04/11/12 54.1 kg (119 lb 4.3 oz)  04/03/12 51.8 kg (114 lb 3.2 oz)  11/22/11 50.803 kg (112 lb)    Physical Exam:  General appearance: alert, cooperative, appears stated age and no distress Head: Normocephalic, without obvious abnormality, atraumatic Eyes:  left sclera appears improved. Prosthetic right eye Ears: normal  external ear canals both ears Nose: Nares normal. Septum midline. Mucosa normal. No drainage or sinus tenderness. Throat: lips, mucosa, and tongue normal; teeth and gums normal Neck: no adenopathy, no carotid bruit, no JVD, supple, symmetrical, trachea midline and thyroid not enlarged, symmetric, no tenderness/mass/nodules Back: symmetric, no curvature. ROM normal. No CVA tenderness. Resp: clear to auscultation bilaterally Cardio: regular rate and rhythm, S1, S2 normal, no murmur, click, rub or gallop GI: soft, non-tender; bowel sounds normal; no masses,  no organomegaly Extremities: extremities normal, atraumatic, no cyanosis or edema Pulses: 2+ and symmetric Skin: Skin color, texture, turgor normal. No rashes or lesions Neurologic: resting tremor, pill rolling at times, speech fluctuates, fairly alert. Moves all 4's.  (3-4/5). Walks with narrow base of support and shuffling gait. Incision/Wound: clean and intact     Assessment/Plan: 1. Functional deficits secondary to seizure disorder/gait disorder. ?PD which require 3+ hours per day of interdisciplinary therapy in a comprehensive inpatient rehab setting. Physiatrist is providing close team supervision and 24 hour management of active medical problems listed below. Physiatrist and rehab team continue to assess barriers to discharge/monitor patient progress toward functional and medical goals. FIM: FIM - Bathing Bathing Steps Patient Completed: Chest;Right Arm;Left Arm;Abdomen;Front perineal area;Buttocks;Right upper leg;Left upper leg;Right lower leg (including foot);Left lower leg (including foot) Bathing: 4: Min-Patient completes 8-9 65f 10 parts or 75+ percent  FIM - Upper Body Dressing/Undressing Upper body dressing/undressing steps patient completed: Thread/unthread right bra strap;Thread/unthread left bra strap;Thread/unthread right sleeve of pullover shirt/dresss;Thread/unthread left sleeve of pullover shirt/dress;Put head through opening of pull over shirt/dress;Pull shirt over trunk;Button/unbutton shirt Upper body dressing/undressing: 4: Min-Patient completed 75 plus % of tasks (Pt required A to button buttons, pull bra down, adjust shirt) FIM - Lower Body Dressing/Undressing Lower body dressing/undressing steps patient completed: Thread/unthread right underwear leg;Thread/unthread left underwear leg;Pull underwear up/down;Thread/unthread right pants leg;Thread/unthread left pants leg;Pull pants up/down;Fasten/unfasten pants;Don/Doff right shoe;Don/Doff left shoe;Fasten/unfasten right shoe;Fasten/unfasten left shoe Lower body dressing/undressing: 4: Min-Patient completed 75 plus % of tasks (verbal encouragement)  FIM - Toileting Toileting steps completed by patient: Adjust clothing prior to toileting Toileting Assistive Devices: Grab bar or rail for support Toileting: 3: Mod-Patient completed 2 of 3 steps  FIM - Quarry manager Devices: Grab bars Toilet Transfers: 4-To toilet/BSC: Min A (steadying Pt. > 75%);4-From toilet/BSC: Min A (steadying Pt. > 75%)  FIM - Bed/Chair Transfer Bed/Chair Transfer Assistive Devices: Manufacturing systems engineer Transfer: 5: Bed > Chair  or W/C: Supervision (verbal cues/safety issues);5: Chair or W/C > Bed: Supervision (verbal cues/safety issues)  FIM - Locomotion: Wheelchair Distance: 25' Locomotion: Wheelchair: 1: Travels less than 50 ft with minimal assistance (Pt.>75%) FIM - Locomotion: Ambulation Locomotion: Ambulation Assistive Devices: Designer, industrial/product Ambulation/Gait Assistance: 5: Supervision Locomotion: Ambulation: 5: Travels 150 ft or more with supervision/safety issues  Comprehension Comprehension Mode: Auditory Comprehension: 4-Understands basic 75 - 89% of the time/requires cueing 10 - 24% of the time  Expression Expression Mode: Verbal Expression Assistive Devices:  (none) Expression: 5-Expresses complex 90% of the time/cues < 10% of the time  Social Interaction Social Interaction: 4-Interacts appropriately 75 - 89% of the time - Needs redirection for appropriate language or to initiate interaction.  Problem Solving Problem Solving Mode: Not assessed Problem Solving: 4-Solves basic 75 - 89% of the time/requires cueing 10 - 24% of the time  Memory Memory: 3-Recognizes or recalls 50 - 74% of the time/requires cueing 25 - 49% of the time  1. Deconditioning/hypoxic encephalopathy/Dilantin toxicity  2. DVT Prophylaxis/Anticoagulation: Subcutaneous Lovenox. Monitor platelet counts any signs of bleeding   -continue teds 3. seizure disorder. Dilantin resumed 100 mg twice a day. Continue primidone 100 mg daily and 50 mg at bedtime. Dilantin level 6.0 04/04/2012 - recheck labs wednesday 4. Benign essential tremor. Inderal 80 mg twice a day. Patient has seen Dr. Sandria Manly in the past. Exam and gait are consistent with parkinson's disease in my opinion..  5.  History of corneal transplant 3 months ago at [facility]. Will add saline drops for eye irritation. Needs to have eye gtts. 6. Hyponatremia. 120-126. Followup labs with Na 123 today. Continue to check serially 7. Recent catarct surgery- eye patch qhs. Eye drops for irritation/post op care  LOS (Days) 7 A FACE TO FACE EVALUATION WAS PERFORMED  Ensley Blas T 04/11/2012, 8:27 AM

## 2012-04-12 DIAGNOSIS — G931 Anoxic brain damage, not elsewhere classified: Secondary | ICD-10-CM

## 2012-04-12 DIAGNOSIS — Z5189 Encounter for other specified aftercare: Secondary | ICD-10-CM

## 2012-04-12 LAB — COMPREHENSIVE METABOLIC PANEL
AST: 17 U/L (ref 0–37)
Albumin: 2.5 g/dL — ABNORMAL LOW (ref 3.5–5.2)
Alkaline Phosphatase: 94 U/L (ref 39–117)
Chloride: 97 mEq/L (ref 96–112)
Potassium: 4.3 mEq/L (ref 3.5–5.1)
Total Bilirubin: 0.2 mg/dL — ABNORMAL LOW (ref 0.3–1.2)

## 2012-04-12 LAB — PHENYTOIN LEVEL, TOTAL: Phenytoin Lvl: 3.2 ug/mL — ABNORMAL LOW (ref 10.0–20.0)

## 2012-04-12 MED ORDER — PHENYTOIN SODIUM EXTENDED 100 MG PO CAPS
200.0000 mg | ORAL_CAPSULE | Freq: Every day | ORAL | Status: DC
  Administered 2012-04-12 – 2012-04-18 (×7): 200 mg via ORAL
  Filled 2012-04-12 (×10): qty 2

## 2012-04-12 MED ORDER — PHENYTOIN SODIUM EXTENDED 100 MG PO CAPS
100.0000 mg | ORAL_CAPSULE | Freq: Every day | ORAL | Status: DC
  Administered 2012-04-13 – 2012-04-19 (×7): 100 mg via ORAL
  Filled 2012-04-12 (×10): qty 1

## 2012-04-12 NOTE — Progress Notes (Signed)
ROS otherwise negative No new complaints. Excited to go home Objective: Vital Signs: Blood pressure 133/71, pulse 72, temperature 98.7 F (37.1 C), temperature source Oral, resp. rate 17, height 5\' 2"  (1.575 m), weight 52 kg (114 lb 10.2 oz), SpO2 98.00%. No results found. No results found for this basename: WBC:2,HGB:2,HCT:2,PLT:2 in the last 72 hours  CBG (last 3)    Wt Readings from Last 3 Encounters:  04/12/12 52 kg (114 lb 10.2 oz)  04/03/12 51.8 kg (114 lb 3.2 oz)  11/22/11 50.803 kg (112 lb)    Physical Exam:  General appearance: alert, cooperative, appears stated age and no distress Head: Normocephalic, without obvious abnormality, atraumatic Eyes:  left sclera appears improved. Prosthetic right eye Ears: normal  external ear canals both ears Nose: Nares normal. Septum midline. Mucosa normal. No drainage or sinus tenderness. Throat: lips, mucosa, and tongue normal; teeth and gums normal Neck: no adenopathy, no carotid bruit, no JVD, supple, symmetrical, trachea midline and thyroid not enlarged, symmetric, no tenderness/mass/nodules Back: symmetric, no curvature. ROM normal. No CVA tenderness. Resp: clear to auscultation bilaterally Cardio: regular rate and rhythm, S1, S2 normal, no murmur, click, rub or gallop GI: soft, non-tender; bowel sounds normal; no masses,  no organomegaly Extremities: extremities normal, atraumatic, no cyanosis or edema Pulses: 2+ and symmetric Skin: Skin color, texture, turgor normal. No rashes or lesions Neurologic: resting tremor, pill rolling at times, speech fluctuates, fairly alert. Moves all 4's.  (3-4/5). Walks with narrow base of support and shuffling gait. Incision/Wound: clean and intact    Assessment/Plan: 1. Functional deficits secondary to seizure disorder/gait disorder. ?PD which require 3+ hours per day of interdisciplinary therapy in a comprehensive inpatient rehab setting. Physiatrist is providing close team supervision and  24 hour management of active medical problems listed below. Physiatrist and rehab team continue to assess barriers to discharge/monitor patient progress toward functional and medical goals. FIM: FIM - Bathing Bathing Steps Patient Completed: Chest;Right Arm;Left Arm;Front perineal area;Abdomen;Buttocks;Right upper leg;Left upper leg;Right lower leg (including foot);Left lower leg (including foot) Bathing: 4: Min-Patient completes 8-9 56f 10 parts or 75+ percent  FIM - Upper Body Dressing/Undressing Upper body dressing/undressing steps patient completed: Thread/unthread right bra strap;Thread/unthread left bra strap;Thread/unthread right sleeve of pullover shirt/dresss;Thread/unthread left sleeve of pullover shirt/dress;Put head through opening of pull over shirt/dress;Pull shirt over trunk Upper body dressing/undressing: 4: Min-Patient completed 75 plus % of tasks (Pt req A to unbutton buttons PJ, pull bra down, adjust shirt) FIM - Lower Body Dressing/Undressing Lower body dressing/undressing steps patient completed: Thread/unthread right pants leg;Thread/unthread left pants leg;Pull pants up/down;Fasten/unfasten pants;Don/Doff right sock;Don/Doff left sock;Don/Doff right shoe;Don/Doff left shoe;Fasten/unfasten right shoe;Fasten/unfasten left shoe Lower body dressing/undressing: 4: Min-Patient completed 75 plus % of tasks  FIM - Toileting Toileting steps completed by patient: Adjust clothing prior to toileting;Performs perineal hygiene;Adjust clothing after toileting Toileting Assistive Devices: Grab bar or rail for support Toileting: 4: Steadying assist (VC's for initiation, pt waiting for OT perform peri care)  FIM - Archivist Transfers Assistive Devices: Bedside commode;Grab bars;Walker Toilet Transfers: 5-To toilet/BSC: Supervision (verbal cues/safety issues);4-From toilet/BSC: Min A (steadying Pt. > 75%)  FIM - Bed/Chair Transfer Bed/Chair Transfer Assistive Devices:  Therapist, occupational: 5: Supine > Sit: Supervision (verbal cues/safety issues);4: Bed > Chair or W/C: Min A (steadying Pt. > 75%);4: Chair or W/C > Bed: Min A (steadying Pt. > 75%)  FIM - Locomotion: Wheelchair Distance: 15' Locomotion: Wheelchair: 1: Travels less than 50 ft with minimal assistance (Pt.>75%) FIM -  Locomotion: Ambulation Locomotion: Ambulation Assistive Devices: Designer, industrial/product Ambulation/Gait Assistance: 5: Supervision Locomotion: Ambulation: 5: Travels 150 ft or more with supervision/safety issues  Comprehension Comprehension Mode: Auditory Comprehension: 5-Follows basic conversation/direction: With extra time/assistive device  Expression Expression Mode: Verbal Expression Assistive Devices:  (none) Expression: 5-Expresses complex 90% of the time/cues < 10% of the time  Social Interaction Social Interaction: 4-Interacts appropriately 75 - 89% of the time - Needs redirection for appropriate language or to initiate interaction.  Problem Solving Problem Solving Mode: Not assessed Problem Solving: 4-Solves basic 75 - 89% of the time/requires cueing 10 - 24% of the time  Memory Memory: 3-Recognizes or recalls 50 - 74% of the time/requires cueing 25 - 49% of the time  1. Deconditioning/hypoxic encephalopathy/Dilantin toxicity  2. DVT Prophylaxis/Anticoagulation: Subcutaneous Lovenox. Monitor platelet counts any signs of bleeding   -continue teds 3. seizure disorder. Dilantin resumed 100 mg--increase to tid. Continue primidone 100 mg daily and 50 mg at bedtime. Dilantin level 6.0 04/04/2012 - recheck labs wednesday 4. Benign essential tremor. Inderal 80 mg twice a day. Patient has seen Dr. Sandria Manly in the past. Exam and gait are consistent with parkinson's disease in my opinion..  5. History of corneal transplant 3 months ago at [facility]. Will add saline drops for eye irritation. Needs to have eye gtts. 6. Hyponatremia. 120-126. Followup labs with Na 123  today.  7. Recent catarct surgery- eye patch qhs. Eye drops for irritation/post op caress  LOS (Days) 8 A FACE TO FACE EVALUATION WAS PERFORMED  Yechezkel Fertig T 04/12/2012, 8:22 AM

## 2012-04-12 NOTE — Patient Care Conference (Addendum)
Inpatient RehabilitationTeam Conference Note Date: 04/11/2012   Time: 3:30 PM    Patient Name: Audrey Obrien      Medical Record Number: [id number]  Date of Birth: 04-15-1931 Sex: Female         Room/Bed: 4140/4140-01 Payor Info: Payor: MEDICARE  Plan: MEDICARE PART A AND B  Product Type: *No Product type*     Admitting Diagnosis: Hypoxic Encephalopathy, Dilantin toxic  Admit Date/Time:  04/04/2012  4:06 PM Admission Comments: No comment available   Primary Diagnosis:  Physical deconditioning Principal Problem: Physical deconditioning  Patient Active Problem List  Diagnoses Date Noted  . Physical deconditioning 04/05/2012  . Dilantin toxicity 03/31/2012  . UTI (lower urinary tract infection) 03/28/2012  . Hyponatremia 03/28/2012  . Dehydration 03/28/2012  . HTN (hypertension) 03/28/2012  . Seizure disorder 03/28/2012  . Scleroderma 03/28/2012  . Dysphagia 03/28/2012  . Weakness generalized 03/28/2012  . Peripheral edema 03/28/2012  . Chronic cough 09/15/2011  . Bronchiectasis without acute exacerbation 09/15/2011  . Chronic allergic rhinitis 09/15/2011  . GERD (gastroesophageal reflux disease) 09/15/2011    Expected Discharge Date: Expected Discharge Date: 04/19/12  Team Members Present: Physician: Dr. Faith Rogue Case Manager Present: Melanee Spry, RN Social Worker Present: Amada Jupiter, LCSW Nurse Present: Rosalio Macadamia, RN PT Present: Reggy Eye, PT OT Present: Roney Mans, OT SLP Present: Feliberto Gottron, SLP Other (Discipline and Name): Tora Duck, PPS Coordinator     Current Status/Progress Goal Weekly Team Focus  Medical   improved arousal. likely parkinsonian. eye is improving with less irritation  improved balance and memory  safety, increased awareness   Bowel/Bladder   min assist         Swallow/Nutrition/ Hydration   advanced to dysphagia 3, thin liquid. mod assist for use of strategies.   min assist  increase independence with use of swallowing  strategies. goal updated as patient tolerating dysphagia 3 diet.    ADL's   supervision bathing and lower body dressing, min assist upper body dressing  supervision and occasional min assist      Mobility   min A with RW, has difficulty retaining cues (such as safe hand placement with sit <>stand)  supervision  repetition for retention, increase safety with mobility, strengthening and balance   Communication   Pt is able to express needs         Safety/Cognition/ Behavioral Observations  mod assist for initiation and problem solving  min assist  increase initiation of activities without clincian motivation, increase functional problem solving skills.    Pain   no pain report  <3      Skin   Buttocks is red, blancheable  monitor skin Q shift  Skin will remain intact during admission      *See Interdisciplinary Assessment and Plan and progress notes for long and short-term goals  Barriers to Discharge: safety awareness and balance    Possible Resolutions to Barriers:  supervision at home    Discharge Planning/Teaching Needs:  Home with husband who can provide 24/7 assistance - attending therapy for education.      Team Discussion: Pt participating txs, limited by memory, vision, cognition deficits--needs constant cuing--poor carryover of info.  Need husband in for education.   Revisions to Treatment Plan: none    Continued Need for Acute Rehabilitation Level of Care: The patient requires daily medical management by a physician with specialized training in physical medicine and rehabilitation for the following conditions: Daily direction of a multidisciplinary physical rehabilitation program to  ensure safe treatment while eliciting the highest outcome that is of practical value to the patient.: Yes Daily medical management of patient stability for increased activity during participation in an intensive rehabilitation regime.: Yes Daily analysis of laboratory values and/or  radiology reports with any subsequent need for medication adjustment of medical intervention for : Post surgical problems;Neurological problems  Brock Ra 04/13/2012, 1:24 PM

## 2012-04-12 NOTE — Progress Notes (Signed)
Physical Therapy Session Note  Patient Details  Name: Audrey Obrien MRN: [id number] Date of Birth: 10-27-1931  Today's Date: 04/12/2012 Time:  -  11:15-12:00    Short Term Goals: Week 1:  PT Short Term Goal 1 (Week 1): Pt will perform sit <> stand with min assist, min verbal cues for technique.  PT Short Term Goal 1 - Progress (Week 1): Met PT Short Term Goal 2 (Week 1): Pt ambulate 100' with least restricitive assistive device with min-guard assist in controlled environment.  PT Short Term Goal 2 - Progress (Week 1): Met PT Short Term Goal 3 (Week 1): Pt will ascend/descent 2 steps with one rail, min assist.  PT Short Term Goal 3 - Progress (Week 1): Met PT Short Term Goal 4 (Week 1): Pt will self propell wheelchair in home environment x 25' with up to min assist.  PT Short Term Goal 4 - Progress (Week 1): Not met  Skilled Therapeutic Interventions/Progress Updates:      Therapy Documentation Precautions:  Precautions Precautions: Fall Other Brace/Splint: Eye patch L when supine, lifting, or bending over (recent corneal implant surgery) Restrictions Weight Bearing Restrictions: No Other Position/Activity Restrictions: RN spoke with MD, pt ok to participate in therapies without patch.        Pain:denies   Mobility: vc needed for hand placement with sit to stand   Locomotion : gait controlled environment 2 x 150' with rw minguard assist with vc for walker position with turning to sit.        Balance: Standardized Balance Assessment Standardized Balance Assessment: Berg Balance Test Berg Balance Test Sit to Stand: Able to stand using hands after several tries Standing Unsupported: Able to stand 2 minutes with supervision Sitting with Back Unsupported but Feet Supported on Floor or Stool: Able to sit safely and securely 2 minutes Stand to Sit: Controls descent by using hands Transfers: Needs one person to assist Standing Unsupported with Eyes Closed: Able to stand 10  seconds with supervision Standing Ubsupported with Feet Together: Able to place feet together independently and stand for 1 minute with supervision From Standing, Reach Forward with Outstretched Arm: Can reach forward >12 cm safely (5") From Standing Position, Pick up Object from Floor: Unable to try/needs assist to keep balance From Standing Position, Turn to Look Behind Over each Shoulder: Needs supervision when turning Turn 360 Degrees: Needs assistance while turning Standing Unsupported, Alternately Place Feet on Step/Stool: Able to complete >2 steps/needs minimal assist Standing Unsupported, One Foot in Front: Needs help to step but can hold 15 seconds Standing on One Leg: Tries to lift leg/unable to hold 3 seconds but remains standing independently Total Score: 26     Other Treatments:    See FIM for current functional status  Therapy/Group: Individual Therapy  Julian Reil 04/12/2012, 11:55 AM

## 2012-04-12 NOTE — Progress Notes (Signed)
Physical Therapy Session Note  Patient Details  Name: Audrey Obrien MRN: [id number] Date of Birth: [birth year]  Today's Date: 04/12/2012 Time: 4098-1191 Time Calculation (min): 28 min  Short Term Goals: Week 1:  PT Short Term Goal 1 (Week 1): Pt will perform sit <> stand with min assist, min verbal cues for technique.  PT Short Term Goal 1 - Progress (Week 1): Met PT Short Term Goal 2 (Week 1): Pt ambulate 100' with least restricitive assistive device with min-guard assist in controlled environment.  PT Short Term Goal 2 - Progress (Week 1): Met PT Short Term Goal 3 (Week 1): Pt will ascend/descent 2 steps with one rail, min assist.  PT Short Term Goal 3 - Progress (Week 1): Met PT Short Term Goal 4 (Week 1): Pt will self propell wheelchair in home environment x 25' with up to min assist.  PT Short Term Goal 4 - Progress (Week 1): Not met  Skilled Therapeutic Interventions/Progress Updates: Tx focused on gait training and therex for LE strengthening and activity tolerance.      Therapy Documentation Precautions:  Precautions Precautions: Fall Other Brace/Splint: Eye patch L when supine, lifting, or bending over (recent corneal implant surgery) Restrictions Weight Bearing Restrictions: No Other Position/Activity Restrictions: RN spoke with MD, pt ok to participate in therapies without patch.    Pain: Pain Assessment Pain Assessment: No/denies pain Mobility: Sit<>stand with Min-guard assist and cues for safe hand placement.    Locomotion : Ambulation Ambulation/Gait Assistance: 5: Supervision 2x150' with RW and cues for pathfinding and straight path for safety.     Exercises: Nustep x3min with bil UE and LE for and LEs only for remainder due to fatigue. Pt required 3 theraputic rest breaks for activity tolerance.      See FIM for current functional status  Therapy/Group: Individual Therapy  Virl Cagey 04/12/2012, 2:33 PM

## 2012-04-12 NOTE — Progress Notes (Signed)
Speech Language Pathology Daily Session Note  Patient Details  Name: Audrey Obrien MRN: [id number] Date of Birth: 28-Mar-1931  Today's Date: 04/12/2012 Time: 0900-1000 Time Calculation (min): 60 min  Short Term Goals: Week 1: SLP Short Term Goal 1 (Week 1): Pt will consume trials of Dys. 3 and demonstrate efficient mastication with Min verbal cues SLP Short Term Goal 1 - Progress (Week 1): Met SLP Short Term Goal 2 (Week 1): Pt will demonstrate selective attention in mildly distracting enviornment for ~10 mins with Mod verbal cues for redirection SLP Short Term Goal 2 - Progress (Week 1): Progressing toward goal SLP Short Term Goal 3 (Week 1): Pt will intiate funtional tasks (grooming) with Min A verbal cues. SLP Short Term Goal 3 - Progress (Week 1): Progressing toward goal SLP Short Term Goal 4 (Week 1): Pt will demonstrate functional problem solving with basic and familair tasks with Min A verbal and visual cues.  SLP Short Term Goal 4 - Progress (Week 4): Progressing toward goal SLP Short Term Goal 5 (Week 1): pt will utilize external aids to increase recall of new, daily information with Min A verbal and semantic cues.  SLP Short Term Goal 5 - Progress (Week 1): Progressing toward goal  Skilled Therapeutic Interventions: Treatment focused on cognitive-linguistic function. 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 In addition, patient continues however to require max assist for intellectual awareness of deficits in general  and for anticipatory awareness of how these deficits will impact function after d/c and significant short term recall deficits with decreased vision impacting ability to utilize external aids as efficiently as would otherwise be possible. She will continue to benefit from skilled intervention at this time.   Daily Session Precautions/Restrictions    FIM:  Comprehension Comprehension: 5-Follows basic conversation/direction: With no assist Expression Expression: 5-Expresses complex 90% of the time/cues < 10% of the time Social Interaction Social Interaction: 4-Interacts appropriately 75 - 89% of the time - Needs redirection for appropriate language or to initiate interaction. Problem Solving Problem Solving: 4-Solves basic 75 - 89% of the time/requires cueing 10 - 24% of the time Memory Memory: 3-Recognizes or recalls 50 - 74% of the time/requires cueing 25 - 49% of the time General    Pain Pain Assessment Pain Assessment: No/denies pain Cognition: Orientation Level: Oriented X4 Oral/Motor: Oral Motor/Sensory Function Overall Oral Motor/Sensory Function: Appears within functional limits for tasks assessed Motor Speech Overall Motor Speech: Impaired Respiration: Within functional limits Phonation:  (disfluent due to rigidity/tremors ) Resonance: Within functional limits Articulation: Within functional limitis Intelligibility: Intelligibility reduced Sentence: 75-100% accurate Motor Planning: Witnin functional limits Comprehension: Auditory Comprehension Overall Auditory Comprehension: Impaired Yes/No Questions: Impaired Complex Questions: 50-74% accurate Commands: Impaired Two Step Basic Commands: 50-74% accurate Conversation: Simple Interfering Components: Attention;Anxiety;Visual impairments;Processing speed;Working Radio broadcast assistant: Producer, television/film/video time;Repetition Reading Comprehension Reading Status: Not tested (due to vision) Expression: Expression Primary  Mode of Expression: Verbal Verbal Expression Initiation: Impaired Automatic Speech: Name;Social Response Level of Generative/Spontaneous Verbalization: Sentence Repetition: No impairment Naming: No impairment Pragmatics: No impairment Interfering Components: Attention  Written Expression Dominant Hand: Right Written Expression: Not tested  Therapy/Group: Individual Therapy  Ferdinand Lango MA, CCC-SLP [id number]   Ferdinand Lango [name] 04/12/2012, 1:53 PM

## 2012-04-12 NOTE — Progress Notes (Signed)
Occupational Therapy Session Note  Patient Details  Name: Audrey Obrien MRN: [id number] Date of Birth: 1931-01-17  Today's Date: 04/12/2012 Time: 0800-0855 Time Calculation (min): 55 min  Short Term Goals: Week 1:  OT Short Term Goal 1 (Week 1): Patient will dress upper body with minimal assistance OT Short Term Goal 2 (Week 1): Patient will dress lower body with minimal assistance OT Short Term Goal 3 (Week 1): Patient will bathe herself with minimal assistance OT Short Term Goal 4 (Week 1): Patient will complete toileting with moderate assistance OT Short Term Goal 5 (Week 1): Patient will complete grooming tasks with set up and cueing.  Skilled Therapeutic Interventions/Progress Updates:  Self care retraining to include gather items using RW for self care session, shower, dress and groom.  Focus session on setting up the session with the expectation that the patient will be performing all tasks with my supervision, safe transfer training, walker safety, activity tolerance, standing tolerance & balance.  Overall patient Supervision with min verbal cues for technique and safety with occasional min assist with mobility.  Patient reports that she tied her shoes for the first time in a long time.  Precautions:  Precautions Precautions: Fall Other Brace/Splint: Eye patch L when supine, lifting, or bending over (recent corneal implant surgery) Restrictions Weight Bearing Restrictions: No Other Position/Activity Restrictions: RN spoke with MD, pt ok to participate in therapies without patch.   Pain: No c/o pain  See FIM for current functional status  Therapy/Group: Individual Therapy  Gerhardt Gleed 04/12/2012, 12:48 PM

## 2012-04-13 NOTE — Progress Notes (Signed)
Occupational Therapy Note  Patient Details  Name: Audrey Obrien MRN: [id number] Date of Birth: 06-10-1931 Today's Date: 04/13/2012  Time: 1400-1430 Pt denies pain Individual therapy  Pt engaged in toilet transfers and toileting and home mgmt tasks at amb level with r/w.  Pt completed tasks at supervision level with verbal cues for safety awareness, especially with sitting down.  Pt often starts to sit down before completely backing up to chair.     Lavone Neri [facility] 04/13/2012, 3:59 PM

## 2012-04-13 NOTE — Progress Notes (Signed)
Speech Language Pathology Daily Session Note and Weekly Progress Note  Patient Details  Name: Audrey Obrien MRN: [id number] Date of Birth: 06-27-1931  Today's Date: 04/13/2012 Time: 7829-5621 Time Calculation (min): 60 min  Short Term Goals: Week 1: SLP Short Term Goal 1 (Week 1): Pt will consume trials of Dys. 3 and demonstrate efficient mastication with Min verbal cues SLP Short Term Goal 1 - Progress (Week 1): Met SLP Short Term Goal 2 (Week 1): Pt will demonstrate selective attention in mildly distracting enviornment for ~10 mins with Mod verbal cues for redirection SLP Short Term Goal 2 - Progress (Week 1): Updated due to goal met SLP Short Term Goal 3 (Week 1): Pt will intiate funtional tasks (grooming) with Min A verbal cues. SLP Short Term Goal 3 - Progress (Week 1): Updated due to goal met SLP Short Term Goal 4 (Week 1): Pt will demonstrate functional problem solving with basic and familair tasks with Min A verbal and visual cues.  SLP Short Term Goal 4 - Progress (Week 4): Updated due to goal met SLP Short Term Goal 5 (Week 1): pt will utilize external aids to increase recall of new, daily information with Min A verbal and semantic cues.  SLP Short Term Goal 5 - Progress (Week 1): Progressing toward goal  Skilled Therapeutic Interventions: Treatment focused on use of compensatory strategies, aspiration precautions during po intake as well as functional problem solving, attention, and initiation of task.  Patient requires max assist for functional short term recall of daily and personal information as well as utilization of compensatory memory strategies. Moderate verbal cueing required to alternate bite and sip during meal to assist in esophageal clearance of bolus. In addition to memory, largest barrier at this time is awareness of deficits, impact on overall function, and needs after d/c, including need to independently carry out appropriate ADLs without relying on un needed  assistance.   Daily Session Precautions/Restrictions    FIM:  Comprehension Comprehension: 5-Follows basic conversation/direction: With extra time/assistive device Expression Expression: 5-Expresses complex 90% of the time/cues < 10% of the time Social Interaction Social Interaction: 4-Interacts appropriately 75 - 89% of the time - Needs redirection for appropriate language or to initiate interaction. Problem Solving Problem Solving: 4-Solves basic 75 - 89% of the time/requires cueing 10 - 24% of the time Memory Memory: 2-Recognizes or recalls 25 - 49% of the time/requires cueing 51 - 75% of the time General    Pain Pain Assessment Pain Assessment: No/denies pain Cognition:   Oral/Motor: Oral Motor/Sensory Function Overall Oral Motor/Sensory Function: Appears within functional limits for tasks assessed Motor Speech Overall Motor Speech: Impaired Respiration: Within functional limits Phonation:  (disfluent due to rigidity/tremors ) Resonance: Within functional limits Articulation: Within functional limitis Intelligibility: Intelligibility reduced Sentence: 75-100% accurate Motor Planning: Witnin functional limits Comprehension: Auditory Comprehension Overall Auditory Comprehension: Impaired Yes/No Questions: Impaired Complex Questions: 50-74% accurate Commands: Impaired Two Step Basic Commands: 50-74% accurate Conversation: Simple Interfering Components: Attention;Anxiety;Visual impairments;Processing speed;Working Radio broadcast assistant: Producer, television/film/video time;Repetition Reading Comprehension Reading Status: Not tested (due to vision) Expression: Expression Primary Mode of Expression: Verbal Verbal Expression Initiation: Impaired Automatic Speech: Name;Social Response Level of Generative/Spontaneous Verbalization: Sentence Repetition: No impairment Naming: No impairment Pragmatics: No impairment Interfering Components: Attention Written Expression Dominant  Hand: Right Written Expression: Not tested  Therapy/Group: Individual Therapy   Speech Language Pathology Weekly Progress Note  Patient Details  Name: Audrey Obrien MRN: [id number] Date of Birth: [birth year]  Today's Date: 04/13/2012 Time: [phone]  Time Calculation (min): 60 min  Short Term Goals: Week 1: SLP Short Term Goal 1 (Week 1): Pt will consume trials of Dys. 3 and demonstrate efficient mastication with Min verbal cues SLP Short Term Goal 1 - Progress (Week 1): Met SLP Short Term Goal 2 (Week 1): Pt will demonstrate selective attention in mildly distracting enviornment for ~10 mins with Mod verbal cues for redirection SLP Short Term Goal 2 - Progress (Week 1): Updated due to goal met SLP Short Term Goal 3 (Week 1): Pt will intiate funtional tasks (grooming) with Min A verbal cues. SLP Short Term Goal 3 - Progress (Week 1): Updated due to goal met SLP Short Term Goal 4 (Week 1): Pt will demonstrate functional problem solving with basic and familair tasks with Min A verbal and visual cues.  SLP Short Term Goal 4 - Progress (Week 4): Updated due to goal met SLP Short Term Goal 5 (Week 1): pt will utilize external aids to increase recall of new, daily information with Min A verbal and semantic cues.  SLP Short Term Goal 5 - Progress (Week 1): Progressing toward goal Week 2: SLP Short Term Goal 1 (Week 2): Pt will demonstrate selective attention in mildly distracting enviornment for ~10 mins with supervision verbal cues for redirection SLP Short Term Goal 2 (Week 2): Pt will intiate funtional tasks (grooming) with supervision verbal cues. SLP Short Term Goal 3 (Week 2): Pt will demonstrate functional problem solving with basic and familair tasks with supervision verbal and visual cues.  SLP Short Term Goal 4 (Week 2): pt will utilize external aids to increase recall of new, daily information with Min A verbal and semantic cues.  SLP Short Term Goal 5 (Week 2): Patient will utilize  compensatory strategies and aspiration precautions during meal consumption with supervision  Weekly Progress Updates: Patient making good progress with functional goals. 2/5 goals met, 2/5 updated. Greatest barriers continue to be short term memory impacting carryover of daily information i from session to session, and general awareness of situation, impact of overall function, and need to independently carry out appropriate ADLs without relying on un needed assistance.     Daily Session Precautions/Restrictions    FIM:  Comprehension Comprehension: 5-Follows basic conversation/direction: With extra time/assistive device Expression Expression: 5-Expresses complex 90% of the time/cues < 10% of the time Social Interaction Social Interaction: 4-Interacts appropriately 75 - 89% of the time - Needs redirection for appropriate language or to initiate interaction. Problem Solving Problem Solving: 4-Solves basic 75 - 89% of the time/requires cueing 10 - 24% of the time Memory Memory: 2-Recognizes or recalls 25 - 49% of the time/requires cueing 51 - 75% of the time General    Pain Pain Assessment Pain Assessment: No/denies pain Cognition:   Oral/Motor: Oral Motor/Sensory Function Overall Oral Motor/Sensory Function: Appears within functional limits for tasks assessed Motor Speech Overall Motor Speech: Impaired Respiration: Within functional limits Phonation:  (disfluent due to rigidity/tremors ) Resonance: Within functional limits Articulation: Within functional limitis Intelligibility: Intelligibility reduced Sentence: 75-100% accurate Motor Planning: Witnin functional limits Comprehension: Auditory Comprehension Overall Auditory Comprehension: Impaired Yes/No Questions: Impaired Complex Questions: 50-74% accurate Commands: Impaired Two Step Basic Commands: 50-74% accurate Conversation: Simple Interfering Components: Attention;Anxiety;Visual impairments;Processing speed;Working  Radio broadcast assistant: Producer, television/film/video time;Repetition Reading Comprehension Reading Status: Not tested (due to vision) Expression: Expression Primary Mode of Expression: Verbal Verbal Expression Initiation: Impaired Automatic Speech: Name;Social Response Level of Generative/Spontaneous Verbalization: Sentence Repetition: No impairment Naming: No impairment Pragmatics: No impairment Interfering Components:  Attention Written Expression Dominant Hand: Right Written Expression: Not tested    Shreyan Hinz [name] 04/13/2012, 2:36 PM

## 2012-04-13 NOTE — Progress Notes (Signed)
Physical Therapy Session Note  Patient Details  Name: MAKYNNA MANOCCHIO MRN: [id number] Date of Birth: 1931-05-14  Today's Date: 04/13/2012 Time: 2130-8657 Time Calculation (min): 42 min  Short Term Goals: Week 2:   Same as long term goals  Skilled Therapeutic Interventions/Progress Updates:    Gait training x 150' with RW, supervision verbal cues for obstacle negotiation and upright posture. Sit<>stand min-guard progressing to supervision verbal cues needed for safe UE placement repeatedly, practiced repeated sit <> stands for repetition/retention. 6" steps x 10 with min assist however improved to only needing min-guard assist. Verbal cues for safety and control of descent. Dynamic balance retraining on [employer] using "limits of stability" and the baseball toss game. Pt requires min assist at times to promote hip strategy and understand how to weight shift. Gait training x 125' without device in controlled and home environment (side stepping,obstacles, reaching for low drawers) with min assist, decreased LE control noted and increased bil. knee "sinking." Pt continues to need RW at this time.  Therapy Documentation Precautions:  Precautions Precautions: Fall Other Brace/Splint: Eye patch L when supine, lifting, or bending over (recent corneal implant surgery) Restrictions Weight Bearing Restrictions: No Other Position/Activity Restrictions: RN spoke with MD, pt ok to participate in therapies without patch.  Pain: Pain Assessment Pain Assessment: No/denies pain  See FIM for current functional status  Therapy/Group: Individual Therapy  [name] 04/13/2012, 6:03 PM

## 2012-04-13 NOTE — Progress Notes (Signed)
Occupational Therapy Session Note  Patient Details  Name: DEVANSHI CALIFF MRN: [id number] Date of Birth: 1931/03/03  Today's Date: 04/13/2012 Time:  0700- 0800    Short Term Goals: Week 1:  OT Short Term Goal 1 (Week 1): Patient will dress upper body with minimal assistance OT Short Term Goal 2 (Week 1): Patient will dress lower body with minimal assistance OT Short Term Goal 3 (Week 1): Patient will bathe herself with minimal assistance OT Short Term Goal 4 (Week 1): Patient will complete toileting with moderate assistance OT Short Term Goal 5 (Week 1): Patient will complete grooming tasks with set up and cueing.  Skilled Therapeutic Interventions/Progress Updates:    Pt engaged in ADL retraining including bathing at shower level, amb with r/w for toileting, shower transfer, and to gather clothing, and dressing with sit to stand from w/c.  Pt required min verbal cues for sequencing.  Pt did not exhibit any LOB with amb and standing activities. Pt completed grooming activities including brushing teeth while standing at sink.  Focus on activity tolerance, dynamic standing balance, and safety awareness.  Therapy Documentation Precautions:  Precautions Precautions: Fall Other Brace/Splint: Eye patch L when supine, lifting, or bending over (recent corneal implant surgery) Restrictions Weight Bearing Restrictions: No Other Position/Activity Restrictions: RN spoke with MD, pt ok to participate in therapies without patch.    Pain:  Pt denies pain  See FIM for current functional status  Therapy/Group: Individual Therapy  Rich Brave 04/13/2012, 12:09 PM

## 2012-04-13 NOTE — Care Management Note (Signed)
Per State Regulation 482.30 This chart was reviewed for medical necessity with respect to the patient's Admission/Duration of stay. Gave pt & daughter, Newt Lukes pt's husband, by phone-he is home sick] team conf report: ELOS 04/19/12  S-Min Assist goals.  Daughter observing txs.  Husband hopes to be in Friday to begin his family ed.   Brock Ra                 Nurse Care Manager              Next Review Date: 04/17/12

## 2012-04-13 NOTE — Progress Notes (Signed)
Patient ID: Audrey Obrien, female   DOB: 24-Feb-1931, 78 y.o.   MRN: [id number]  ROS otherwise negative No new complaints.  Objective: Vital Signs: Blood pressure 119/70, pulse 70, temperature 98.4 F (36.9 C), temperature source Oral, resp. rate 18, height 5\' 2"  (1.575 m), weight 49.9 kg (110 lb 0.2 oz), SpO2 94.00%. No results found. No results found for this basename: WBC:2,HGB:2,HCT:2,PLT:2 in the last 72 hours  CBG (last 3)    Wt Readings from Last 3 Encounters:  04/12/12 49.9 kg (110 lb 0.2 oz)  04/03/12 51.8 kg (114 lb 3.2 oz)  11/22/11 50.803 kg (112 lb)    Physical Exam:  General appearance: alert, cooperative, appears stated age and no distress Head: Normocephalic, without obvious abnormality, atraumatic Eyes:  left sclera appears improved. Prosthetic right eye Ears: normal  external ear canals both ears Nose: Nares normal. Septum midline. Mucosa normal. No drainage or sinus tenderness. Throat: lips, mucosa, and tongue normal; teeth and gums normal Neck: no adenopathy, no carotid bruit, no JVD, supple, symmetrical, trachea midline and thyroid not enlarged, symmetric, no tenderness/mass/nodules Back: symmetric, no curvature. ROM normal. No CVA tenderness. Resp: clear to auscultation bilaterally Cardio: regular rate and rhythm, S1, S2 normal, no murmur, click, rub or gallop GI: soft, non-tender; bowel sounds normal; no masses,  no organomegaly Extremities: extremities normal, atraumatic, no cyanosis or edema Pulses: 2+ and symmetric Skin: Skin color, texture, turgor normal. No rashes or lesions Neurologic: resting tremor, pill rolling at times, speech fluctuates, fairly alert. Moves all 4's.  (3-4/5). Walks with narrow base of support and shuffling gait. Improving vision thru the left eye Incision/Wound: clean and intact    Assessment/Plan: 1. Functional deficits secondary to seizure disorder/gait disorder. ?PD which require 3+ hours per day of interdisciplinary therapy in  a comprehensive inpatient rehab setting. Physiatrist is providing close team supervision and 24 hour management of active medical problems listed below. Physiatrist and rehab team continue to assess barriers to discharge/monitor patient progress toward functional and medical goals. FIM: FIM - Bathing Bathing Steps Patient Completed: Chest;Right Arm;Left Arm;Abdomen;Front perineal area;Buttocks;Right upper leg;Left upper leg;Left lower leg (including foot);Right lower leg (including foot) Bathing: 5: Supervision: Safety issues/verbal cues  FIM - Upper Body Dressing/Undressing Upper body dressing/undressing steps patient completed: Thread/unthread right bra strap;Thread/unthread left bra strap;Thread/unthread right sleeve of pullover shirt/dresss;Thread/unthread left sleeve of pullover shirt/dress;Put head through opening of pull over shirt/dress;Pull shirt over trunk Upper body dressing/undressing: 4: Min-Patient completed 75 plus % of tasks FIM - Lower Body Dressing/Undressing Lower body dressing/undressing steps patient completed: Thread/unthread right underwear leg;Thread/unthread left underwear leg;Pull underwear up/down;Pull pants up/down;Thread/unthread left pants leg;Thread/unthread right pants leg;Fasten/unfasten pants;Don/Doff right sock;Don/Doff left sock;Don/Doff right shoe;Don/Doff left shoe;Fasten/unfasten right shoe;Fasten/unfasten left shoe Lower body dressing/undressing: 5: Supervision: Safety issues/verbal cues  FIM - Toileting Toileting steps completed by patient: Adjust clothing prior to toileting;Performs perineal hygiene;Adjust clothing after toileting Toileting Assistive Devices: Grab bar or rail for support Toileting: 5: Supervision: Safety issues/verbal cues  FIM - Diplomatic Services operational officer Devices: Elevated toilet seat;Grab bars Toilet Transfers: 5-To toilet/BSC: Supervision (verbal cues/safety issues);5-From toilet/BSC: Supervision (verbal cues/safety  issues)  FIM - Bed/Chair Transfer Bed/Chair Transfer Assistive Devices: Therapist, occupational: 5: Supine > Sit: Supervision (verbal cues/safety issues);5: Bed > Chair or W/C: Supervision (verbal cues/safety issues)  FIM - Locomotion: Wheelchair Distance: 15' Locomotion: Wheelchair: 0: Activity did not occur FIM - Locomotion: Ambulation Locomotion: Ambulation Assistive Devices: Designer, industrial/product Ambulation/Gait Assistance: 5: Supervision Locomotion: Ambulation: 5: Travels 150 ft  or more with supervision/safety issues  Comprehension Comprehension Mode: Auditory Comprehension: 5-Follows basic conversation/direction: With extra time/assistive device  Expression Expression Mode: Verbal Expression Assistive Devices:  (none) Expression: 5-Expresses complex 90% of the time/cues < 10% of the time  Social Interaction Social Interaction: 4-Interacts appropriately 75 - 89% of the time - Needs redirection for appropriate language or to initiate interaction.  Problem Solving Problem Solving Mode: Not assessed Problem Solving: 4-Solves basic 75 - 89% of the time/requires cueing 10 - 24% of the time  Memory Memory: 3-Recognizes or recalls 50 - 74% of the time/requires cueing 25 - 49% of the time  1. Deconditioning/hypoxic encephalopathy/Dilantin toxicity  2. DVT Prophylaxis/Anticoagulation: Subcutaneous Lovenox. Monitor platelet counts any signs of bleeding   -continue teds 3. seizure disorder. Dilantin increased to 100 qam and 200qpm. Continue primidone 100 mg daily and 50 mg at bedtime. Dilantin level 6.0 04/04/2012 - recheck labs wednesday 4. Benign essential tremor. Inderal 80 mg twice a day. Patient has seen Dr. Sandria Manly in the past. Exam and gait are consistent with parkinson's disease in my opinion..  5. History of corneal transplant 3 months ago at [facility]. Will add saline drops for eye irritation. Needs to have eye gtts. 6. Hyponatremia. 120-126. Followup labs with Na 123  today.  7. Recent catarct surgery- eye patch qhs. Eye drops for irritation/post op caress  LOS (Days) 9 A FACE TO FACE EVALUATION WAS PERFORMED  Kataleah Bejar T 04/13/2012, 10:01 AM

## 2012-04-14 DIAGNOSIS — Z5189 Encounter for other specified aftercare: Secondary | ICD-10-CM

## 2012-04-14 DIAGNOSIS — G931 Anoxic brain damage, not elsewhere classified: Secondary | ICD-10-CM

## 2012-04-14 MED ORDER — ASPIRIN 81 MG PO CHEW
CHEWABLE_TABLET | ORAL | Status: AC
  Filled 2012-04-14: qty 1

## 2012-04-14 NOTE — Progress Notes (Signed)
Speech Language Pathology Daily Session Note  Patient Details  Name: DOMENIQUE QUEST MRN: [id number] Date of Birth: 1931/05/22  Today's Date: 04/14/2012 Time: 0817-0908 Time Calculation (min): 51 min  Short Term Goals: Week 1: SLP Short Term Goal 1 (Week 1): Pt will consume trials of Dys. 3 and demonstrate efficient mastication with Min verbal cues SLP Short Term Goal 1 - Progress (Week 1): Met SLP Short Term Goal 2 (Week 1): Pt will demonstrate selective attention in mildly distracting enviornment for ~10 mins with Mod verbal cues for redirection SLP Short Term Goal 2 - Progress (Week 1): Updated due to goal met SLP Short Term Goal 3 (Week 1): Pt will intiate funtional tasks (grooming) with Min A verbal cues. SLP Short Term Goal 3 - Progress (Week 1): Updated due to goal met SLP Short Term Goal 4 (Week 1): Pt will demonstrate functional problem solving with basic and familair tasks with Min A verbal and visual cues.  SLP Short Term Goal 4 - Progress (Week 4): Updated due to goal met SLP Short Term Goal 5 (Week 1): pt will utilize external aids to increase recall of new, daily information with Min A verbal and semantic cues.  SLP Short Term Goal 5 - Progress (Week 1): Progressing toward goal  Skilled Therapeutic Interventions:  Treatment focused on swallow safety during breakfast meal, working memory, problem solving and awareness.  Pt. Unable to state swallow strategies or reasoning independently and required max verbal cues to recall (SLP moved the additional swallow sign lower on the wall and in her view).  Max verbal reminders to perform alternate sips and bites (for esophageal transit) progressing to moderate verbal assist throughout remainder of meal.  No indications of poor airway protection, pharyngeal residue etc.  Functional oral prep, manipulation and transit of Dys 3 texture.  Mild cues needed to problem solve with self feeding.  Overall awareness and apathy of situation decreased.      Daily Session Precautions/Restrictions  Precautions Precautions: Fall FIM:  Comprehension Comprehension: 3-Understands basic 50 - 74% of the time/requires cueing 25 - 50%  of the time Expression Expression Mode: Verbal Expression: 4-Expresses basic 75 - 89% of the time/requires cueing 10 - 24% of the time. Needs helper to occlude trach/needs to repeat words. Social Interaction Social Interaction: 5-Interacts appropriately 90% of the time - Needs monitoring or encouragement for participation or interaction. Problem Solving Problem Solving: 3-Solves basic 50 - 74% of the time/requires cueing 25 - 49% of the time Memory Memory: 2-Recognizes or recalls 25 - 49% of the time/requires cueing 51 - 75% of the time FIM - Eating Eating Activity: 5: Needs verbal cues/supervision;5: Set-up assist for open containers General    Pain Pain Assessment Pain Assessment: No/denies pain  Therapy/Group: Individual Therapy  Breck Coons [place].Ed [employer] [phone]  04/14/2012

## 2012-04-14 NOTE — Progress Notes (Signed)
Patient ID: Audrey Obrien, female   DOB: [birth year], 78 y.o.   MRN: [id number] Patient ID: Audrey Obrien, female   DOB: Feb 26, 1931, 78 y.o.   MRN: [id number]  ROS otherwise negative No new complaints. Slept well. Objective: Vital Signs: Blood pressure 115/70, pulse 62, temperature 97.8 F (36.6 C), temperature source Oral, resp. rate 18, height 5\' 2"  (1.575 m), weight 49.9 kg (110 lb 0.2 oz), SpO2 95.00%. No results found. No results found for this basename: WBC:2,HGB:2,HCT:2,PLT:2 in the last 72 hours  CBG (last 3)    Wt Readings from Last 3 Encounters:  04/12/12 49.9 kg (110 lb 0.2 oz)  04/03/12 51.8 kg (114 lb 3.2 oz)  11/22/11 50.803 kg (112 lb)    Physical Exam:  General appearance: alert, cooperative, appears stated age and no distress Head: Normocephalic, without obvious abnormality, atraumatic Eyes:  left sclera appears improved. Prosthetic right eye Ears: normal  external ear canals both ears Nose: Nares normal. Septum midline. Mucosa normal. No drainage or sinus tenderness. Throat: lips, mucosa, and tongue normal; teeth and gums normal Neck: no adenopathy, no carotid bruit, no JVD, supple, symmetrical, trachea midline and thyroid not enlarged, symmetric, no tenderness/mass/nodules Back: symmetric, no curvature. ROM normal. No CVA tenderness. Resp: clear to auscultation bilaterally Cardio: regular rate and rhythm, S1, S2 normal, no murmur, click, rub or gallop GI: soft, non-tender; bowel sounds normal; no masses,  no organomegaly Extremities: extremities normal, atraumatic, no cyanosis or edema Pulses: 2+ and symmetric Skin: Skin color, texture, turgor normal. No rashes or lesions Neurologic: resting tremor, pill rolling at times, undulating speech, fairly alert. Moves all 4's.  (3-4/5). Walks with narrow base of support and shuffling gait. Improving vision thru the left eye Incision/Wound: clean and intact    Assessment/Plan: 1. Functional deficits secondary to seizure  disorder/gait disorder. ?PD which require 3+ hours per day of interdisciplinary therapy in a comprehensive inpatient rehab setting. Physiatrist is providing close team supervision and 24 hour management of active medical problems listed below. Physiatrist and rehab team continue to assess barriers to discharge/monitor patient progress toward functional and medical goals.  Spoke with the patient's husband today who's happy with her progress.  FIM: FIM - Bathing Bathing Steps Patient Completed: Chest;Right Arm;Left Arm;Abdomen;Front perineal area;Buttocks;Right upper leg;Left upper leg;Left lower leg (including foot);Right lower leg (including foot) Bathing: 5: Supervision: Safety issues/verbal cues  FIM - Upper Body Dressing/Undressing Upper body dressing/undressing steps patient completed: Thread/unthread right sleeve of pullover shirt/dresss;Thread/unthread left sleeve of pullover shirt/dress;Thread/unthread right bra strap;Thread/unthread left bra strap;Put head through opening of pull over shirt/dress;Pull shirt over trunk Upper body dressing/undressing: 4: Min-Patient completed 75 plus % of tasks FIM - Lower Body Dressing/Undressing Lower body dressing/undressing steps patient completed: Thread/unthread right underwear leg;Thread/unthread left underwear leg;Pull underwear up/down;Pull pants up/down;Thread/unthread left pants leg;Thread/unthread right pants leg;Don/Doff right shoe;Don/Doff left shoe;Fasten/unfasten right shoe;Fasten/unfasten left shoe Lower body dressing/undressing: 5: Supervision: Safety issues/verbal cues  FIM - Toileting Toileting steps completed by patient: Adjust clothing prior to toileting;Performs perineal hygiene;Adjust clothing after toileting Toileting Assistive Devices: Grab bar or rail for support Toileting: 4: Steadying assist  FIM - Diplomatic Services operational officer Devices: Elevated toilet seat;Grab bars;Walker Toilet Transfers: 5-To toilet/BSC:  Supervision (verbal cues/safety issues);5-From toilet/BSC: Supervision (verbal cues/safety issues)  FIM - Banker Devices: Bed rails Bed/Chair Transfer: 5: Supine > Sit: Supervision (verbal cues/safety issues);5: Bed > Chair or W/C: Supervision (verbal cues/safety issues)  FIM - Locomotion: Wheelchair Distance: 15' Locomotion: Wheelchair: 0: Activity  did not occur FIM - Locomotion: Ambulation Locomotion: Ambulation Assistive Devices: Walker - Rolling Ambulation/Gait Assistance: 5: Supervision Locomotion: Ambulation: 5: Travels 150 ft or more with supervision/safety issues  Comprehension Comprehension Mode: Auditory Comprehension: 5-Follows basic conversation/direction: With extra time/assistive device  Expression Expression Mode: Verbal Expression Assistive Devices:  (none) Expression: 5-Expresses complex 90% of the time/cues < 10% of the time  Social Interaction Social Interaction: 4-Interacts appropriately 75 - 89% of the time - Needs redirection for appropriate language or to initiate interaction.  Problem Solving Problem Solving Mode: Not assessed Problem Solving: 4-Solves basic 75 - 89% of the time/requires cueing 10 - 24% of the time  Memory Memory: 3-Recognizes or recalls 50 - 74% of the time/requires cueing 25 - 49% of the time  1. Deconditioning/hypoxic encephalopathy/Dilantin toxicity  2. DVT Prophylaxis/Anticoagulation: Subcutaneous Lovenox. Monitor platelet counts any signs of bleeding   -continue teds 3. seizure disorder. Dilantin increased to 100 qam and 200qpm. 4. Benign essential tremor. Inderal 80 mg twice a day and primidone. Patient has seen Dr. Sandria Manly in the past. Exam and gait are consistent with parkinson's disease in my opinion..  5. History of corneal transplant 3 months ago at [facility]. Will add saline drops for eye irritation. Needs to have eye gtts. 6. Hyponatremia. 120-126. Followup labs with Na 123  today.  7. Recent catarct surgery- eye patch qhs. Eye drops for irritation/post op caress  LOS (Days) 10 A FACE TO FACE EVALUATION WAS PERFORMED  Braedon Sjogren T 04/14/2012, 8:06 AM

## 2012-04-14 NOTE — Progress Notes (Signed)
Physical Therapy Session Note  Patient Details  Name: Audrey Obrien MRN: [id number] Date of Birth: [birth year]  Today's Date: 04/14/2012 Time: 0930-1001 Time Calculation (min): 31 min  Short Term Goals: Week 2:   Same as long term goals  Skilled Therapeutic Interventions/Progress Updates:  No c/o pain    Husband observing PT treatment session. Educated husband on safe UE placement for sit <>stands, need for railing for steps at home, safety and caregiver positioning to decrease risk for falls. Encouraged husband to mobilize pt as much as possible once at home, encouraged to have pt help out in kitchen with meals. Gait with (150') and without RW (~150'), performed gait training in kitchen. Dynamic balance reaching for items in fridge and cabinets. 10 steps with min-guard assist and one rail. [employer] toss with husband with slightly narrowed base of support, increased challenge by having pt count to 10. Pt demonstrated impaired righting reactions with the cognitive challenge. Supervision for sit <> stand and gait with RW, up to min assist for balance activities.     Second Session Skilled Therapeutic Interventions/Progress Updates:  Time: 1605- 1645  Time Calculation (min): 40 min No c/o pain Gait x 150' with RW x 2 with supervision, verbal cues for speed as pt occasionally goes too fast for her feet. Practiced cone tapping with bil. LEs and 6"box tapping with bil. LEs to front then side no UE support focusing on single limb stance. Practiced picking up bean bags from floor and walking back wards without UE support to further promote improved balance. Performed static stance on 5" compliant beam perpendicular to pt's base of support and focused on improving pt's hip strategy. Gait during and between activitiesperformed without RW. Intermittent min assist for all balance activities.    Therapy Documentation Precautions:  Precautions Precautions: Fall Other Brace/Splint: Eye patch L when supine,  lifting, or bending over (recent corneal implant surgery) Restrictions Weight Bearing Restrictions: No Other Position/Activity Restrictions: RN spoke with MD, pt ok to participate in therapies without patch.  Vital Signs: Therapy Vitals Temp: 97.7 F (36.5 C) Temp src: Oral Pulse Rate: 63  Resp: 17  BP: 114/67 mmHg Patient Position, if appropriate: Sitting Oxygen Therapy SpO2: 97 % O2 Device: None (Room air) Locomotion : Ambulation Ambulation/Gait Assistance: 5: Supervision   See FIM for current functional status  Therapy/Group: Individual Therapy  [name] 04/14/2012, 5:00 PM

## 2012-04-14 NOTE — Progress Notes (Signed)
Occupational Therapy Weekly Progress Note  Patient Details  Name: Audrey Obrien MRN: [id number] Date of Birth: [birth year]  Today's Date: 04/14/2012 Time: 0700-0800 Time Calculation (min): 60 min  Patient has met 5 of 5 short term goals.  Pt is progressing steadily since admission and is supervision/min A for BADLs.  Pt continues to require min verbal cues for safety awareness with sit<>stand and r/w safety.  Pt continues to require min encouragemnt to initiate/complete tasks that she previously thought she couldn't attempt.  Husband has been present for therapy sessions and is pleased with the progress pt has mad this admission.  Patient continues to demonstrate the following deficits: decreased balance, decreased activity tolerance, learned helplessness, decreased initiation, and upper extremity tremors and therefore will continue to benefit from skilled OT intervention to enhance overall performance with BADL.  Patient progressing toward long term goals..  Continue plan of care.  OT Short Term Goals Week 1:  OT Short Term Goal 1 (Week 1): Patient will dress upper body with minimal assistance OT Short Term Goal 1 - Progress (Week 1): Met OT Short Term Goal 2 (Week 1): Patient will dress lower body with minimal assistance OT Short Term Goal 2 - Progress (Week 1): Met OT Short Term Goal 3 (Week 1): Patient will bathe herself with minimal assistance OT Short Term Goal 3 - Progress (Week 1): Met OT Short Term Goal 4 (Week 1): Patient will complete toileting with moderate assistance OT Short Term Goal 4 - Progress (Week 1): Met OT Short Term Goal 5 (Week 1): Patient will complete grooming tasks with set up and cueing. OT Short Term Goal 5 - Progress (Week 1): Met  Skilled Therapeutic Interventions/Progress Updates:    Pt engaged in bathing at walk-in shower level and dressing with sit to stand from EOB.  [name] present for therapy session.  Pt required min verbal cues for safety awareness  and assistance with tieing right shoe.  Pt practiced stepping over edge of walk-in shower with steady assist.  Husband stated that shower at home has a seat and hand-held shower head.  Pt continues to require min verbal cues for r/w safety.    Therapy Documentation Precautions:  Precautions Precautions: Fall Other Brace/Splint: Eye patch L when supine, lifting, or bending over (recent corneal implant surgery) Restrictions Weight Bearing Restrictions: No Other Position/Activity Restrictions: RN spoke with MD, pt ok to participate in therapies without patch.    Pain: Pain Assessment Pain Assessment: No/denies pain ADL: ADL Grooming: Supervision/safety Where Assessed-Grooming: Standing at sink Upper Body Bathing: Supervision/safety Where Assessed-Upper Body Bathing: Shower Lower Body Bathing: Supervision/safety Where Assessed-Lower Body Bathing: Shower Upper Body Dressing: Minimal assistance Where Assessed-Upper Body Dressing: Edge of bed Lower Body Dressing: Supervision/safety Where Assessed-Lower Body Dressing: Edge of bed Film/video editor: Close supervision Film/video editor Method: Designer, industrial/product: Shower seat with back  See FIM for current functional status  Therapy/Group: Individual Therapy  Rich Brave 04/14/2012, 10:44 AM

## 2012-04-15 MED ORDER — ASPIRIN 81 MG PO CHEW
CHEWABLE_TABLET | ORAL | Status: AC
  Filled 2012-04-15: qty 1

## 2012-04-15 NOTE — Progress Notes (Signed)
Patient ID: Audrey Obrien, female   DOB: February 19, 1931, 78 y.o.   MRN: [id number] ROS otherwise negative No new complaints  She continues t feel a little bit weak. Objective: Vital Signs: Blood pressure 114/66, pulse 65, temperature 97.9 F (36.6 C), temperature source Oral, resp. rate 17, height 5\' 2"  (1.575 m), weight 110 lb 0.2 oz (49.9 kg), SpO2 96.00%. No results found. No results found for this basename: WBC:2,HGB:2,HCT:2,PLT:2 in the last 72 hours    Physical Exam:  Elderly female in no acute distress. HEENT exam atraumatic, normocephalic, neck is supple. Chest is clear to auscultation cardiac exam S1-S2 are regular. Abdominal exam active bowel sounds, soft. Extremities no edema. Neurologic exam she has demonstrable pill-rolling and resting tremor.  Assessment/Plan: 1. Functional deficits secondary to seizure disorder/gait disorder. ?PD which require 3+ hours per day of interdisciplinary therapy in a comprehensive inpatient rehab setting. Physiatrist is providing close team supervision and 24 hour management of active medical problems listed below. Physiatrist and rehab team continue to assess barriers to discharge/monitor patient progress toward functional and medical goals.  Spoke with the patient's husband today who's happy with her progress.  FIM: FIM - Bathing Bathing Steps Patient Completed: Chest;Right Arm;Left Arm;Abdomen;Front perineal area;Buttocks;Right upper leg;Left upper leg;Left lower leg (including foot);Right lower leg (including foot) Bathing: 5: Supervision: Safety issues/verbal cues  FIM - Upper Body Dressing/Undressing Upper body dressing/undressing steps patient completed: Thread/unthread right sleeve of pullover shirt/dresss;Thread/unthread left sleeve of pullover shirt/dress;Hook/unhook bra;Thread/unthread left bra strap;Thread/unthread right bra strap;Put head through opening of pull over shirt/dress Upper body dressing/undressing: 4: Min-Patient completed 75  plus % of tasks FIM - Lower Body Dressing/Undressing Lower body dressing/undressing steps patient completed: Thread/unthread right underwear leg;Thread/unthread left underwear leg;Pull underwear up/down;Thread/unthread right pants leg;Thread/unthread left pants leg;Pull pants up/down;Fasten/unfasten pants;Don/Doff right sock;Don/Doff left sock;Don/Doff right shoe;Don/Doff left shoe Lower body dressing/undressing: 5: Supervision: Safety issues/verbal cues  FIM - Toileting Toileting steps completed by patient: Adjust clothing prior to toileting;Performs perineal hygiene;Adjust clothing after toileting Toileting Assistive Devices: Grab bar or rail for support Toileting: 5: Supervision: Safety issues/verbal cues  FIM - Diplomatic Services operational officer Devices: Elevated toilet seat;Grab bars Toilet Transfers: 5-To toilet/BSC: Supervision (verbal cues/safety issues);5-From toilet/BSC: Supervision (verbal cues/safety issues)  FIM - Banker Devices: Bed rails Bed/Chair Transfer: 5: Supine > Sit: Supervision (verbal cues/safety issues);5: Bed > Chair or W/C: Supervision (verbal cues/safety issues)  FIM - Locomotion: Wheelchair Distance: 15' Locomotion: Wheelchair: 0: Activity did not occur FIM - Locomotion: Ambulation Locomotion: Ambulation Assistive Devices: Designer, industrial/product Ambulation/Gait Assistance: 5: Supervision Locomotion: Ambulation: 5: Travels 150 ft or more with supervision/safety issues  Comprehension Comprehension Mode: Auditory Comprehension: 5-Understands complex 90% of the time/Cues < 10% of the time  Expression Expression Mode: Verbal Expression Assistive Devices:  (none) Expression: 4-Expresses basic 75 - 89% of the time/requires cueing 10 - 24% of the time. Needs helper to occlude trach/needs to repeat words.  Social Interaction Social Interaction: 5-Interacts appropriately 90% of the time - Needs monitoring or  encouragement for participation or interaction.  Problem Solving Problem Solving Mode: Not assessed Problem Solving: 4-Solves basic 75 - 89% of the time/requires cueing 10 - 24% of the time  Memory Memory: 3-Recognizes or recalls 50 - 74% of the time/requires cueing 25 - 49% of the time  1. Deconditioning/hypoxic encephalopathy/Dilantin toxicity  2. DVT Prophylaxis/Anticoagulation: Subcutaneous Lovenox. Monitor platelet counts any signs of bleeding   -continue teds 3. seizure disorder. Dilantin increased to 100 qam and  200qpm. 4. Benign essential tremor. Inderal 80 mg twice a day and primidone. I suspect she has Parkinson's disease. She may have an additional essential tremor.  5. History of corneal transplant 3 months ago at [facility]. Will add saline drops for eye irritation. Needs to have eye gtts. 6. Hyponatremia. Improving. Basic Metabolic Panel:    Component Value Date/Time   NA 130* 04/12/2012 0620   K 4.3 04/12/2012 0620   CL 97 04/12/2012 0620   CO2 25 04/12/2012 0620   BUN 17 04/12/2012 0620   CREATININE 0.63 04/12/2012 0620   GLUCOSE 82 04/12/2012 0620   CALCIUM 8.8 04/12/2012 0620    7. Recent catarct surgery- eye patch qhs. Eye drops for irritation/post op caress  LOS (Days) 11 A FACE TO FACE EVALUATION WAS PERFORMED  Audrey Obrien 04/15/2012, 5:06 PM

## 2012-04-15 NOTE — Progress Notes (Signed)
Occupational Therapy Note  Patient Details  Name: Audrey Obrien MRN: [id number] Date of Birth: 12/30/1930 Today's Date: 04/15/2012  Time: 1300-1400 Pt denies pain Group Therapy  Pt participated in  therapeutic activities group with focus on functional ambulation to retrieve items from varying heights and surfaces.  Pt ambulated with and without r/w.  Pt engaged in maneuvering safely through obstacle course with emphasis on correct r/w manipulation.  Pt continues to requires min verbal cues when sitting down.  Pt continues to start sitting down before squared up to w.c.   Lavone Neri [facility] 04/15/2012, 3:49 PM

## 2012-04-16 DIAGNOSIS — A310 Pulmonary mycobacterial infection: Secondary | ICD-10-CM | POA: Diagnosis present

## 2012-04-16 LAB — URINALYSIS, ROUTINE W REFLEX MICROSCOPIC
Ketones, ur: NEGATIVE mg/dL
Protein, ur: NEGATIVE mg/dL
Urobilinogen, UA: 0.2 mg/dL (ref 0.0–1.0)

## 2012-04-16 LAB — URINE MICROSCOPIC-ADD ON

## 2012-04-16 MED ORDER — CIPROFLOXACIN HCL 250 MG PO TABS
250.0000 mg | ORAL_TABLET | Freq: Two times a day (BID) | ORAL | Status: DC
  Administered 2012-04-16 – 2012-04-19 (×7): 250 mg via ORAL
  Filled 2012-04-16 (×9): qty 1

## 2012-04-16 MED ORDER — PHENAZOPYRIDINE HCL 200 MG PO TABS
200.0000 mg | ORAL_TABLET | Freq: Two times a day (BID) | ORAL | Status: AC
  Administered 2012-04-16 – 2012-04-18 (×4): 200 mg via ORAL
  Filled 2012-04-16 (×4): qty 1

## 2012-04-16 NOTE — Progress Notes (Signed)
Pt complained of burning during urination, dr notified, new orders noted, u/a, c/s sent per orders, urine cloudy with sediment noted

## 2012-04-16 NOTE — Progress Notes (Addendum)
Patient ID: Audrey Obrien, female   DOB: 05/21/1931, 78 y.o.   MRN: [id number] No new complaints . Her husband reports that she was to be on some antibiotic. I have reviewed the chart and reviewed Dr. Kavin Leech notes. I've reviewed CT scans. She has Mycobacterium avium intracellular complex. According to his last note it was unclear whether she had been fully treated. He had recommended starting Biaxin January of 2013. It's unclear whether she started this. She had previously failed 3 drug treatment.  After leaving the floor I was called by the nurse who states that the patient developed dysuria. ROS otherwise negative  Objective: Vital Signs: Blood pressure 117/64, pulse 67, temperature 98.1 F (36.7 C), temperature source Oral, resp. rate 18, height 5\' 2"  (1.575 m), weight 110 lb 0.2 oz (49.9 kg), SpO2 93.00%.  Physical Exam:  Elderly female in no acute distress. HEENT exam atraumatic, normocephalic, neck is supple. Chest is clear to auscultation cardiac exam S1-S2 are regular. Abdominal exam active bowel sounds, soft. Extremities no edema. Neurologic exam she has demonstrable pill-rolling and resting tremor. No significant cogwheeling.  Assessment/Plan: 1. Functional deficits secondary to seizure disorder/gait disorder. ?PD which require 3+ hours per day of interdisciplinary therapy in a comprehensive inpatient rehab setting. Physiatrist is providing close team supervision and 24 hour management of active medical problems listed below. Physiatrist and rehab team continue to assess barriers to discharge/monitor patient progress toward functional and medical goals.  Spoke with the patient's husband today who's happy with her progress.  FIM: FIM - Bathing Bathing Steps Patient Completed: Chest;Right Arm;Left Arm;Abdomen;Front perineal area;Buttocks;Right upper leg;Left upper leg;Left lower leg (including foot);Right lower leg (including foot) Bathing: 5: Supervision: Safety issues/verbal  cues  FIM - Upper Body Dressing/Undressing Upper body dressing/undressing steps patient completed: Thread/unthread right sleeve of pullover shirt/dresss;Thread/unthread left sleeve of pullover shirt/dress;Hook/unhook bra;Thread/unthread left bra strap;Thread/unthread right bra strap;Put head through opening of pull over shirt/dress Upper body dressing/undressing: 4: Min-Patient completed 75 plus % of tasks FIM - Lower Body Dressing/Undressing Lower body dressing/undressing steps patient completed: Thread/unthread right underwear leg;Thread/unthread left underwear leg;Pull underwear up/down;Thread/unthread right pants leg;Thread/unthread left pants leg;Pull pants up/down;Fasten/unfasten pants;Don/Doff right sock;Don/Doff left sock;Don/Doff right shoe;Don/Doff left shoe Lower body dressing/undressing: 5: Supervision: Safety issues/verbal cues  FIM - Toileting Toileting steps completed by patient: Adjust clothing prior to toileting;Performs perineal hygiene;Adjust clothing after toileting Toileting Assistive Devices: Grab bar or rail for support Toileting: 6: Assistive device: No helper  FIM - Diplomatic Services operational officer Devices: Elevated toilet seat;Grab bars Toilet Transfers: 5-To toilet/BSC: Supervision (verbal cues/safety issues)  FIM - Banker Devices: Bed rails Bed/Chair Transfer: 5: Supine > Sit: Supervision (verbal cues/safety issues);5: Bed > Chair or W/C: Supervision (verbal cues/safety issues)  FIM - Locomotion: Wheelchair Distance: 15' Locomotion: Wheelchair: 0: Activity did not occur FIM - Locomotion: Ambulation Locomotion: Ambulation Assistive Devices: Designer, industrial/product Ambulation/Gait Assistance: 5: Supervision Locomotion: Ambulation: 5: Travels 150 ft or more with supervision/safety issues  Comprehension Comprehension Mode: Auditory Comprehension: 5-Understands complex 90% of the time/Cues < 10% of the  time  Expression Expression Mode: Verbal Expression Assistive Devices:  (none) Expression: 4-Expresses basic 75 - 89% of the time/requires cueing 10 - 24% of the time. Needs helper to occlude trach/needs to repeat words.  Social Interaction Social Interaction: 4-Interacts appropriately 75 - 89% of the time - Needs redirection for appropriate language or to initiate interaction.  Problem Solving Problem Solving Mode: Not assessed Problem Solving: 4-Solves basic 75 -  89% of the time/requires cueing 10 - 24% of the time  Memory Memory: 3-Recognizes or recalls 50 - 74% of the time/requires cueing 25 - 49% of the time  1. Deconditioning/hypoxic encephalopathy/Dilantin toxicity  2. DVT Prophylaxis/Anticoagulation: Subcutaneous Lovenox. Monitor platelet counts any signs of bleeding   -continue teds 3. seizure disorder. Dilantin increased to 100 qam and 200qpm. 4. Benign essential tremor. Inderal 80 mg twice a day and primidone. I suspect she has Parkinson's disease. She may have an additional essential tremor.  5. History of corneal transplant 3 months ago at [facility]. Will add saline drops for eye irritation. Needs to have eye gtts. 6. Hyponatremia. Improving. Basic Metabolic Panel:    Component Value Date/Time   NA 130* 04/12/2012 0620   K 4.3 04/12/2012 0620   CL 97 04/12/2012 0620   CO2 25 04/12/2012 0620   BUN 17 04/12/2012 0620   CREATININE 0.63 04/12/2012 0620   GLUCOSE 82 04/12/2012 0620   CALCIUM 8.8 04/12/2012 0620    7. Recent catarct surgery- eye patch qhs. Eye drops for irritation/post op caress 8. Mycobacterium avium. I think given her current problems we should wait to reconsider Biaxin. There was some consideration that Biaxin contributed to anorexia and nausea. 9. Recurrent dysuria. Note previous urinary tract infection. I'll check a urinalysis culture and sensitivity today.  LOS (Days) 12 A FACE TO FACE EVALUATION WAS PERFORMED  Delio Slates HENRY 04/16/2012, 9:22 AM

## 2012-04-16 NOTE — Progress Notes (Signed)
Physical Therapy Note  Patient Details  Name: Audrey Obrien MRN: [id number] Date of Birth: 1931/05/10 Today's Date: 04/16/2012  1430-1530 (60 minutes) GROUP Pain: no complaint of pain Pt participated in PT group session focused on gait safety/endurance. Pt ambulates 160 feet X 2 with RW close supervision secondary to decreased standing balance.Pt performed static standing with alternate LE on 6 inch step to facilitate standing balance reactions with decreased balance to right.    Ladell Lea,JIM 04/16/2012, 3:31 PM

## 2012-04-16 NOTE — Progress Notes (Signed)
Eye patch applied to OS at HS. Ambulating to bathroom with steadying assistance. BUE tremors noted. destin cream applied to sacrum, buttock slightly red, but no broken skin. Tawanna Solo

## 2012-04-17 NOTE — Plan of Care (Signed)
Problem: RH KNOWLEDGE DEFICIT Goal: RH STG INCREASE KNOWLEDGE OF DYSPHAGIA/FLUID INTAKE Min assist  Outcome: Progressing Cues for drinking, encouraging fluids through out the shift

## 2012-04-17 NOTE — Plan of Care (Signed)
Problem: RH SAFETY Goal: RH STG DEMO UNDERSTANDING HOME SAFETY PRECAUTIONS Outcome: Progressing Pt's spouse able to verbalize plans he is making to make home safe for wife. Ie.. removing throw rugs, making sure floors not slick, removing clutter ,etc

## 2012-04-17 NOTE — Progress Notes (Signed)
Physical Therapy Note  Patient Details  Name: Audrey Obrien MRN: [id number] Date of Birth: 1931/02/11 Today's Date: 04/17/2012  1330-1355 (25 minutes) individual Pain: no complaint of pain Focus of treatment : Therapeutic activities to facilitate standing balance reactions Treatment: Gait 120 feet X 2 RW SBA; reaching activities to right or left > arms length with trunk rotation in standing; resistive gait without AD 160 feet ; standing ball toss close SBA.   [name],JIM 04/17/2012, 1:43 PM

## 2012-04-17 NOTE — Progress Notes (Signed)
Occupational Therapy Session Note  Patient Details  Name: Audrey Obrien MRN: [id number] Date of Birth: 10-12-1931  Today's Date: 04/17/2012 Time: 0700-0757 Time Calculation (min): 57 min  Short Term Goals: Week 2:   STG = LTG  Skilled Therapeutic Interventions/Progress Updates:    Pt in bed but easily aroused and agreeable to participate in bathing and dressing tasks.  Pt amb to bathroom with r/w for toileting and shower transfer.  Pt completed bathing with sit to stand using grab bars.  Pt amb to room to pick out clothing and completed dressing with sit to stand from EOB.  Pt able to fasten pants and tie shoes with extra time secondary to tremors during FM tasks.  Pt amb with r/w to ADL apt for home mgmt tasks with focus on safety awareness and dynamic standing balance.  Therapy Documentation Precautions:  Precautions Precautions: Fall Other Brace/Splint: Eye patch L when supine, lifting, or bending over (recent corneal implant surgery) Restrictions Weight Bearing Restrictions: No Other Position/Activity Restrictions: RN spoke with MD, pt ok to participate in therapies without patch.  Pain: Pain Assessment Pain Assessment: No/denies pain  See FIM for current functional status  Therapy/Group: Individual TherapyST  Rich Brave 04/17/2012, 7:57 AM

## 2012-04-17 NOTE — Progress Notes (Signed)
Physical Therapy Note  Patient Details  Name: Audrey Obrien MRN: [id number] Date of Birth: 18-Oct-1931 Today's Date: 04/17/2012  9147-8295 (55 minutes) individual Pain: no complaint of pain Focus of treatment: Therapeutic activities to initiated standing balance reactions Treatment: Gait from room to gym 100 feet RW close supervision + max cues for direction finding: Biodex on stable surface to facilitate weight shifts and limits of stability with decreased hip and ankle strategies (min assist); balance board using parallel bars for safety side to side and forward and back weight shifts; standing on 2 inch board for hip strategy training (min assist); up/down 4 inch step with assist to prevent loss of balance in stepping down.: gait back to room RW SBA with cues to locate room.  Ashleah Valtierra,JIM 04/17/2012, 9:23 AM

## 2012-04-17 NOTE — Plan of Care (Signed)
Problem: RH SKIN INTEGRITY Goal: RH STG MAINTAIN SKIN INTEGRITY WITH ASSISTANCE STG Maintain Skin Integrity With Mod Assistance.  Outcome: Not Applicable Date Met:  04/17/12 Duplicate see under brain injury

## 2012-04-17 NOTE — Plan of Care (Signed)
Problem: RH PAIN MANAGEMENT Goal: RH STG PAIN MANAGED AT OR BELOW PT'S PAIN GOAL Pain </=2  Outcome: Progressing No complaint of pain

## 2012-04-17 NOTE — Progress Notes (Signed)
ROS otherwise negative No new complaints. Husband states she was on an abx pta since leaving [facility] (clarithromycin) Objective: Vital Signs: Blood pressure 108/66, pulse 64, temperature 98.1 F (36.7 C), temperature source Oral, resp. rate 18, height 5\' 2"  (1.575 m), weight 49.9 kg (110 lb 0.2 oz), SpO2 93.00%. No results found. No results found for this basename: WBC:2,HGB:2,HCT:2,PLT:2 in the last 72 hours  CBG (last 3)    Wt Readings from Last 3 Encounters:  04/12/12 49.9 kg (110 lb 0.2 oz)  04/03/12 51.8 kg (114 lb 3.2 oz)  11/22/11 50.803 kg (112 lb)    Physical Exam:  General appearance: alert, cooperative, appears stated age and no distress Head: Normocephalic, without obvious abnormality, atraumatic Eyes:  left sclera appears improved. Prosthetic right eye Ears: normal  external ear canals both ears Nose: Nares normal. Septum midline. Mucosa normal. No drainage or sinus tenderness. Throat: lips, mucosa, and tongue normal; teeth and gums normal Neck: no adenopathy, no carotid bruit, no JVD, supple, symmetrical, trachea midline and thyroid not enlarged, symmetric, no tenderness/mass/nodules Back: symmetric, no curvature. ROM normal. No CVA tenderness. Resp: clear to auscultation bilaterally Cardio: regular rate and rhythm, S1, S2 normal, no murmur, click, rub or gallop GI: soft, non-tender; bowel sounds normal; no masses,  no organomegaly Extremities: extremities normal, atraumatic, no cyanosis or edema Pulses: 2+ and symmetric Skin: Skin color, texture, turgor normal. No rashes or lesions Neurologic: resting tremor, pill rolling at times, undulating speech, fairly alert. Moves all 4's.  (3-4/5). Walks with narrow base of support and shuffling gait. Improving vision thru the left eye Incision/Wound: clean and intact    Assessment/Plan: 1. Functional deficits secondary to seizure disorder/gait disorder. ?PD which require 3+ hours per day of interdisciplinary  therapy in a comprehensive inpatient rehab setting. Physiatrist is providing close team supervision and 24 hour management of active medical problems listed below. Physiatrist and rehab team continue to assess barriers to discharge/monitor patient progress toward functional and medical goals.  Spoke with the patient's husband today who's happy with her progress.  FIM: FIM - Bathing Bathing Steps Patient Completed: Chest;Right Arm;Left Arm;Abdomen;Front perineal area;Buttocks;Right upper leg;Left upper leg;Right lower leg (including foot);Left lower leg (including foot) Bathing: 5: Supervision: Safety issues/verbal cues  FIM - Upper Body Dressing/Undressing Upper body dressing/undressing steps patient completed: Thread/unthread right bra strap;Thread/unthread left bra strap;Thread/unthread right sleeve of pullover shirt/dresss;Thread/unthread left sleeve of pullover shirt/dress;Put head through opening of pull over shirt/dress;Pull shirt over trunk Upper body dressing/undressing: 5: Supervision: Safety issues/verbal cues FIM - Lower Body Dressing/Undressing Lower body dressing/undressing steps patient completed: Thread/unthread right underwear leg;Thread/unthread left underwear leg;Pull underwear up/down;Thread/unthread right pants leg;Thread/unthread left pants leg;Pull pants up/down;Fasten/unfasten pants;Don/Doff right shoe;Don/Doff left shoe;Fasten/unfasten left shoe;Fasten/unfasten right shoe Lower body dressing/undressing: 5: Supervision: Safety issues/verbal cues  FIM - Toileting Toileting steps completed by patient: Adjust clothing prior to toileting;Performs perineal hygiene;Adjust clothing after toileting Toileting Assistive Devices: Grab bar or rail for support Toileting: 5: Supervision: Safety issues/verbal cues  FIM - Diplomatic Services operational officer Devices: Elevated toilet seat Toilet Transfers: 5-To toilet/BSC: Supervision (verbal cues/safety issues);5-From  toilet/BSC: Supervision (verbal cues/safety issues)  FIM - Press photographer Assistive Devices: Bed rails Bed/Chair Transfer: 5: Supine > Sit: Supervision (verbal cues/safety issues);5: Bed > Chair or W/C: Supervision (verbal cues/safety issues)  FIM - Locomotion: Wheelchair Distance: 15' Locomotion: Wheelchair: 0: Activity did not occur FIM - Locomotion: Ambulation Locomotion: Ambulation Assistive Devices: Designer, industrial/product Ambulation/Gait Assistance: 5: Supervision Locomotion: Ambulation: 1: Travels less  than 50 ft with minimal assistance (Pt.>75%)  Comprehension Comprehension Mode: Auditory Comprehension: 5-Understands complex 90% of the time/Cues < 10% of the time  Expression Expression Mode: Verbal Expression Assistive Devices:  (none) Expression: 5-Expresses basic needs/ideas: With no assist  Social Interaction Social Interaction: 5-Interacts appropriately 90% of the time - Needs monitoring or encouragement for participation or interaction.  Problem Solving Problem Solving Mode: Not assessed Problem Solving: 4-Solves basic 75 - 89% of the time/requires cueing 10 - 24% of the time  Memory Memory: 4-Recognizes or recalls 75 - 89% of the time/requires cueing 10 - 24% of the time  1. Deconditioning/hypoxic encephalopathy/Dilantin toxicity  2. DVT Prophylaxis/Anticoagulation: Subcutaneous Lovenox. Monitor platelet counts any signs of bleeding   -continue teds 3. seizure disorder. Dilantin increased to 100 qam and 200qpm. 4. Benign essential tremor. Inderal 80 mg twice a day and primidone. Patient has seen Dr. Sandria Manly in the past. Exam and gait are consistent with parkinson's disease in my opinion..  5. History of corneal transplant 3 months ago at [facility]. Will add saline drops for eye irritation. Needs to have eye gtts.  -not sure if the abx mentioned above is related to her surgery. Don't know that she would need an abx at this point. 6.  Hyponatremia. Stable in the mid 120's 7. Positive UA- empiric cipro  -ucx pending  LOS (Days) 13 A FACE TO FACE EVALUATION WAS PERFORMED  Chancelor Hardrick T 04/17/2012, 8:34 AM

## 2012-04-17 NOTE — Care Management Note (Signed)
Per State Regulation 482.30 This chart was reviewed for medical necessity with respect to the patient's Admission/Duration of stay. Pt participating txs & making good progress toward goals.  Need to complete family ed for d/c 04/19/12.   Brock Ra                 Nurse Care Manager              Next Review Date: none

## 2012-04-17 NOTE — Progress Notes (Signed)
Speech Language Pathology Daily Session Note  Patient Details  Name: Audrey Obrien MRN: [id number] Date of Birth: 11-Dec-1930  Today's Date: 04/17/2012 Time: 0700-0757 Time Calculation (min): 57 min  Short Term Goals: Week 2: SLP Short Term Goal 1 (Week 2): Pt will demonstrate selective attention in mildly distracting enviornment for ~10 mins with supervision verbal cues for redirection SLP Short Term Goal 2 (Week 2): Pt will intiate funtional tasks (grooming) with supervision verbal cues. SLP Short Term Goal 3 (Week 2): Pt will demonstrate functional problem solving with basic and familair tasks with supervision verbal and visual cues.  SLP Short Term Goal 4 (Week 2): pt will utilize external aids to increase recall of new, daily information with Min A verbal and semantic cues.  SLP Short Term Goal 5 (Week 2): Patient will utilize compensatory strategies and aspiration precautions during meal consumption with supervision  Skilled Therapeutic Interventions: Treatment focused on swallow precautions, attention, problem solving and memory during functional activity (eating breakfast).  Session carried out with pt.'s door open and t.v.  on providing mild distraction which she was able to ignore with no cues needed.  Pt. alternated bites and sips with min verbal questioning cues and required min problem solving verbal cues during meal (stated strategy with supervision cues).  Hypothetical situations regarding home ADL's posed to pt. needing min verbal cues for appropriate reasoning.     Daily Session Precautions/Restrictions    FIM:  Comprehension Comprehension Mode: Auditory Comprehension: 5-Understands basic 90% of the time/requires cueing < 10% of the time Expression Expression Mode: Verbal Expression: 4-Expresses basic 75 - 89% of the time/requires cueing 10 - 24% of the time. Needs helper to occlude trach/needs to repeat words. Social Interaction Social Interaction: 6-Interacts  appropriately with others with medication or extra time (anti-anxiety, antidepressant). Problem Solving Problem Solving: 4-Solves basic 75 - 89% of the time/requires cueing 10 - 24% of the time Memory Memory: 3-Recognizes or recalls 50 - 74% of the time/requires cueing 25 - 49% of the time FIM - Eating Eating Activity: 5: Set-up assist for open containers;5: Supervision/cues;5: Needs verbal cues/supervision General    Pain Pain Assessment Pain Assessment: No/denies pain   Therapy/Group: Individual Therapy  Royce Macadamia 04/17/2012, 8:40 AM

## 2012-04-17 NOTE — Progress Notes (Signed)
Decreased urinary frequency. Urine orange from pyridium. Bilateral hand tremors. Decreased vision, wearing eye patch at HS. No unsafe behaviors, called for assistance every time to go to BR. Decreased intiation. Tends to allow staff to do for her, encourage patient to do for herself. Audrey Obrien

## 2012-04-17 NOTE — Plan of Care (Signed)
Problem: RH SKIN INTEGRITY Goal: RH STG SKIN FREE OF INFECTION/BREAKDOWN Mod assist  Outcome: Not Applicable Date Met:  04/17/12 Duplicate see under Brain injury

## 2012-04-18 DIAGNOSIS — G931 Anoxic brain damage, not elsewhere classified: Secondary | ICD-10-CM

## 2012-04-18 DIAGNOSIS — Z5189 Encounter for other specified aftercare: Secondary | ICD-10-CM

## 2012-04-18 NOTE — Progress Notes (Signed)
Nutrition Follow-up  Intake remains adequate and likely meeting estimated needs. Intake mostly 50-100%. Pt denies any questions or concerns in regards to nutrition needs and d/c. Emphasized the importance of small, frequent meals for increase in PO intake and continuation of carnation instant breakfast when at home. Pt verbalized understanding.  Diet Order:  Dysphagia 3 with thin liquids Supplements: Ensure Pudding PO BID, Carnation Instant Breakfast daily  Meds: Scheduled Meds:   . antiseptic oral rinse  15 mL Mouth Rinse BID  . aspirin EC  81 mg Oral QHS  . cholecalciferol  1,000 Units Oral Daily  . ciprofloxacin  250 mg Oral BID  . enoxaparin  40 mg Subcutaneous Q24H  . feeding supplement  1 Container Oral BID BM  . magic mouthwash  5 mL Oral TID AC & HS  . pantoprazole  80 mg Oral Q1200  . phenazopyridine  200 mg Oral BID WC  . phenytoin  100 mg Oral Daily  . phenytoin  200 mg Oral QHS  . prednisoLONE acetate  1 drop Left Eye QID  . primidone  100 mg Oral Daily  . primidone  50 mg Oral QHS  . propranolol ER  80 mg Oral BID  . zinc oxide   Topical BID   Continuous Infusions:  PRN Meds:.acetaminophen, HYDROcodone-acetaminophen, naphazoline-pheniramine, ondansetron (ZOFRAN) IV, ondansetron, polyethylene glycol, sorbitol  Labs:  CMP     Component Value Date/Time   NA 130* 04/12/2012 0620   K 4.3 04/12/2012 0620   CL 97 04/12/2012 0620   CO2 25 04/12/2012 0620   GLUCOSE 82 04/12/2012 0620   BUN 17 04/12/2012 0620   CREATININE 0.63 04/12/2012 0620   CALCIUM 8.8 04/12/2012 0620   PROT 5.4* 04/12/2012 0620   ALBUMIN 2.5* 04/12/2012 0620   AST 17 04/12/2012 0620   ALT 10 04/12/2012 0620   ALKPHOS 94 04/12/2012 0620   BILITOT 0.2* 04/12/2012 0620   GFRNONAA 83* 04/12/2012 0620   GFRAA >90 04/12/2012 0620    Intake/Output Summary (Last 24 hours) at 04/18/12 0916 Last data filed at 04/17/12 1900  Gross per 24 hour  Intake    360 ml  Output      2 ml  Net    358 ml    Weight Status:  49.9 kg -  consistent with rehab admit weight of 49.8 kg  Estimated needs:  1200 - 1400 kcal, 58 - 68 grams protein  Nutrition Dx:  none  Goal:  Pt to consume at least 50% of meals. Met.  Intervention:  Continue current interventions.  Monitor:  Weights, labs, PO intake  Adair Laundry Pager #:  [phone]

## 2012-04-18 NOTE — Progress Notes (Addendum)
Speech Language Pathology Daily Session and Discharge Note  Patient Details  Name: Audrey Obrien MRN: [id number] Date of Birth: 1931/01/18  Today's Date: 04/18/2012 Time: 0820-0905 Time Calculation (min): 45 min  Short Term Goals: Week 2: SLP Short Term Goal 1 (Week 2): Pt will demonstrate selective attention in mildly distracting enviornment for ~10 mins with supervision verbal cues for redirection SLP Short Term Goal 1 - Progress (Week 2): Met SLP Short Term Goal 2 (Week 2): Pt will intiate funtional tasks (grooming) with supervision verbal cues. SLP Short Term Goal 2 - Progress (Week 2): Met SLP Short Term Goal 3 (Week 2): Pt will demonstrate functional problem solving with basic and familair tasks with supervision verbal and visual cues.  SLP Short Term Goal 3 - Progress (Week 2): Met SLP Short Term Goal 4 (Week 2): pt will utilize external aids to increase recall of new, daily information with Min A verbal and semantic cues.  SLP Short Term Goal 4 - Progress (Week 2): Not met SLP Short Term Goal 5 (Week 2): Patient will utilize compensatory strategies and aspiration precautions during meal consumption with supervision SLP Short Term Goal 5 - Progress (Week 2): Met  Skilled Therapeutic Interventions: Treatment focused on discharge planning, utilization of compensatory strategies during meals, problem solving, and short term memory. Patient has met 4/5 short term goals, with primary barrier for ability to meet short term memory goal being both inability to utilize external aids due to visual deficits and h/o short term memory deficits for approximately 3 years per husband (being followed by Dr. Sandria Manly at [facility]). Patient however has presented with some improvement in ability to carryover basic information from day to day. Husband now feels that cognitive function has returned to baseline. Patient judged appropriate for return home with husband for supervision, but not carryout, of  basic ADLs as it will be to patient's benefit to maintain as much independence as possible for both cognitive and physical function.   Daily Session Precautions/Restrictions  Precautions Precautions: Fall Other Brace/Splint: Eye patch L when supine, lifting, or bending over (recent corneal implant surgery FIM:  Comprehension Comprehension: 5-Follows basic conversation/direction: With extra time/assistive device Expression Expression Mode: Verbal Expression: 5-Expresses basic needs/ideas: With extra time/assistive device Social Interaction Social Interaction: 6-Interacts appropriately with others with medication or extra time (anti-anxiety, antidepressant). Problem Solving Problem Solving: 5-Solves basic 90% of the time/requires cueing < 10% of the time Memory Memory: 4-Recognizes or recalls 75 - 89% of the time/requires cueing 10 - 24% of the time FIM - Eating Eating Activity: 5: Supervision/cues;5: Set-up assist for open containers;5: Set-up assist for cut food General    Pain Pain Assessment Pain Assessment: No/denies pain Cognition: Overall Cognitive Status: Appears within functional limits for tasks assessed Arousal/Alertness: Awake/alert Orientation Level: Oriented X4 Attention: Selective Selective Attention: Appears intact Selective Attention Impairment: Verbal basic;Functional basic Memory: Impaired Memory Impairment: Decreased recall of new information Decreased Short Term Memory: Functional basic Awareness: Impaired Awareness Impairment: Anticipatory impairment Problem Solving: Impaired Problem Solving Impairment: Functional basic Oral/Motor: Oral Motor/Sensory Function Overall Oral Motor/Sensory Function: Appears within functional limits for tasks assessed Motor Speech Overall Motor Speech: Impaired Respiration: Within functional limits Phonation:  (disfluent due to rigidity/tremors ) Resonance: Within functional limits Articulation: Within functional  limitis Intelligibility: Intelligibility reduced Sentence: 75-100% accurate Motor Planning: Witnin functional limits Comprehension: Auditory Comprehension Overall Auditory Comprehension: Impaired Yes/No Questions: Impaired Complex Questions: 50-74% accurate Commands: Impaired Two Step Basic Commands: 50-74% accurate Conversation: Simple Interfering Components: Attention;Anxiety;Visual impairments;Processing  speed;Working Radio broadcast assistant: Producer, television/film/video time;Repetition Reading Comprehension Reading Status: Not tested (due to vision) Expression: Expression Primary Mode of Expression: Verbal Verbal Expression Initiation: Impaired Automatic Speech: Name;Social Response Level of Generative/Spontaneous Verbalization: Sentence Repetition: No impairment Naming: No impairment Pragmatics: No impairment Interfering Components: Attention Written Expression Dominant Hand: Right Written Expression: Not tested  Therapy/Group: Individual Therapy  Speech Language Pathology Discharge Summary  Patient Details  Name: Audrey Obrien MRN: [id number] Date of Birth: [birth year]  Today's Date: 04/18/2012 Time: 0820-0905 Time Calculation (min): 45 min  Patient has met 5 of 7 long term goals.  Primary barrier for ability to meet short term memory and awareness goals are being both inability to utilize external aids due to visual deficits and h/o short term memory/awareness deficits for approximately 3 years per husband (being followed by Dr. Sandria Manly at [facility]).Patient to discharge at overall Supervision level.  Patient's care partner is independent to provide the necessary cognitive assistance at discharge.  Reasons goals not met:  Baseline short term memory deficits with difficulty utilizing external aids due to visual impairements  Recommendation:  none  Equipment: n/a  Reasons for discharge: treatment goals met  Patient/family agrees with progress made and goals  achieved: Yes  See FIM for current functional status  Rella Egelston [name] 04/18/2012, 10:48 AM

## 2012-04-18 NOTE — Progress Notes (Signed)
Occupational Therapy Discharge Summary  Patient Details  Name: Audrey Obrien MRN: [id number] Date of Birth: [birth year]  Today's Date: 04/18/2012 Time: 0700-0755 Time Calculation (min): 55 min  1:1 OT: Pt engaged in bathing and dressing tasks including functional amb with r/w in room to gather clothing and amb to bathroom for toileting and shower.  Pt required min verbal cues for r/w safety.  Pt completed all bathing tasks with sit to stand from shower seat without using grab bars.  Pt completed dressing tasks with sit to stand from EOB.  Pt able to pull down bra in back with extra time and encouragement to complete task.  Pt transitioned to ADL apt for home management tasks.  Pt completed all tasks with supervision and occasional min verbal cues for safety when sitting down.  Discharge Summary Patient has met 11 of 11 long term goals due to improved activity tolerance, improved balance, ability to compensate for deficits, functional use of  RIGHT upper and LEFT upper extremity, improved attention and improved coordination.  Pt is supervision for bathing, dressing, toileting, and shower transfers. Pt continues to required min verbal cues for safety awareness when ambulating with r/w.  Pt continues to require encouragement to initiate and complete tasks.  [name] has participated in therapy and demonstrated understanding of level of supervision/assistance required.  Patient to discharge at overall Supervision level.  Patient's care partner is independent to provide the necessary cognitive assistance at discharge.    Reasons goals not met: NA  Recommendation:  Patient will benefit from ongoing skilled OT services in home health setting to continue to advance functional skills in the area of BADL and iADL.  Equipment: No equipment provided Pt already owns necessary DME  Reasons for discharge: treatment goals met and discharge from hospital  Patient/family agrees with progress made and goals  achieved: Yes  OT Discharge Precautions/Restrictions  Precautions Precautions: Fall Other Brace/Splint: Eye patch L when supine, lifting, or bending over (recent corneal implant surgery General   Vital Signs Therapy Vitals Temp: 97.8 F (36.6 C) Temp src: Oral Pulse Rate: 78  Resp: 18  BP: 135/77 mmHg Patient Position, if appropriate: Lying Oxygen Therapy SpO2: 96 % O2 Device: None (Room air) Pain Pain Assessment Pain Assessment: No/denies pain ADL ADL Eating: Supervision/safety Where Assessed-Eating: Chair Grooming: Supervision/safety Where Assessed-Grooming: Standing at sink Upper Body Bathing: Supervision/safety Where Assessed-Upper Body Bathing: Shower Lower Body Bathing: Supervision/safety Where Assessed-Lower Body Bathing: Shower Upper Body Dressing: Supervision/safety Where Assessed-Upper Body Dressing: Edge of bed Lower Body Dressing: Supervision/safety Where Assessed-Lower Body Dressing: Edge of bed Toileting: Supervision/safety Where Assessed-Toileting: Teacher, adult education: Distant supervision Statistician Method: Event organiser: Close supervision Film/video editor Method: Designer, industrial/product: Information systems manager with back Vision/Perception  Vision - History Visual History: Cataracts Patient Visual Report:  (Blurring of vision;Eye fatigue/eye ) Perception Perception: Within Functional Limits Praxis Praxis: Intact  Cognition Overall Cognitive Status: Appears within functional limits for tasks assessed Arousal/Alertness: Awake/alert Orientation Level: Oriented X4 Attention: Selective Selective Attention: Appears intact Selective Attention Impairment: Verbal basic;Functional basic Memory: Impaired Memory Impairment: Decreased recall of new information Decreased Short Term Memory: Functional basic Awareness: Impaired Awareness Impairment: Anticipatory impairment Problem Solving: Impaired Problem Solving  Impairment: Functional basic Sensation Sensation Light Touch: Appears Intact Hot/Cold: Appears Intact Proprioception: Appears Intact Coordination Gross Motor Movements are Fluid and Coordinated: No Fine Motor Movements are Fluid and Coordinated: No Coordination and Movement Description: resting tremors with increased amplitude with effort  Motor  Motor Motor: Ataxia;Abnormal postural alignment and control Motor - Discharge Observations: Poor graded movement control, tremors, hesitation with forward flexion to stand  Mobility     Trunk/Postural Assessment  Cervical Assessment Cervical Assessment: Within Functional Limits Thoracic Assessment Thoracic Assessment: Within Functional Limits Lumbar Assessment Lumbar Assessment: Within Functional Limits Postural Control Postural Control: Within Functional Limits  Balance Static Sitting Balance Static Sitting - Balance Support: No upper extremity supported Static Sitting - Level of Assistance: 6: Modified independent (Device/Increase time) Extremity/Trunk Assessment RUE Assessment RUE Assessment: Exceptions to [facility] (resting tremors) LUE Assessment LUE Assessment: Exceptions to [facility] (resting tremors)  See FIM for current functional status  Rich Brave 04/18/2012, 7:57 AM

## 2012-04-18 NOTE — Progress Notes (Signed)
Physical Therapy Discharge Summary  Patient Details  Name: Audrey Obrien MRN: [id number] Date of Birth: May 08, 1931  Today's Date: 04/18/2012 Time: 0102-7253 Time Calculation (min): 41 min  Patient has met 6 of 6 long term goals due to improved activity tolerance, improved balance and increased strength.  Patient to discharge at an ambulatory level Supervision.   Patient's care partner is independent to provide the necessary physical assistance at discharge.  Reasons goals not met: NA  Recommendation:  Patient will benefit from ongoing skilled PT services in home health setting to continue to advance safe functional mobility, address ongoing impairments in balance, strength, independence, and minimize fall risk.  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 Eye.  Cognition Overall Cognitive Status: Impaired: very impaired memory Sensation Sensation Light Touch: Appears Intact Hot/Cold: Appears Intact Proprioception: Appears Intact Coordination Gross Motor Movements are Fluid and Coordinated: No Fine Motor Movements are Fluid and Coordinated: No Coordination and Movement Description: resting UE tremors with increased amplitude with effort  Motor  Motor Motor: Ataxia;Abnormal postural alignment and control Motor - Discharge Observations: Poor graded movement control, tremors, hesitation with forward flexion to stand   Mobility Bed Mobility Bed Mobility: Supine to Sit;Sit to Supine Supine to Sit: 6: Modified independent (Device/Increase time) Sit to Supine: 6: Modified independent  (Device/Increase time) Transfers Sit to Stand: 5: Supervision Sit to Stand Details (indicate cue type and reason): Repeated verbal cues for UE placement. Stand to Sit: 5: Supervision Stand to Sit Details: Good control of descent, verbal cues for UE placement (still with minimal carry over) Locomotion  Ambulation Ambulation: Yes Ambulation/Gait Assistance: 5: Supervision;4: Min assist Ambulation Distance (Feet): 300 Feet (total at least) Assistive device: Rolling walker;None Ambulation/Gait Assistance Details: min assist without device, supervision with RW. Increased sway without RW, husband aware pt needs to be utilizing RW at this time.  Stairs / Additional Locomotion Stairs: Yes Stairs Assistance: 5: Supervision Stairs Assistance Details (indicate cue type and reason): Repeated verbal cues for UE placement/use of rail. Good strength demonstrated with ascent and descent.  Stair Management Technique: One rail Left Number of Stairs: 12  Height of Stairs: 6  (inch)  Extremity Assessment      RLE Assessment RLE Assessment: Exceptions to [facility] RLE AROM (degrees) Overall AROM Right Lower Extremity: Within functional limits for tasks assessed RLE Strength RLE Overall Strength: Within Functional Limits for tasks assessed RLE Overall Strength Comments: Continues with some functional weakness, grossly >/= 4-/5 LLE Assessment LLE Assessment: Within Functional Limits LLE AROM (degrees) LLE Overall AROM Comments: WFL LLE Strength LLE Overall Strength: Within Functional Limits for tasks assessed LLE Overall Strength Comments: Continues with some functional weakness, grossly >/= 4-/5  First Session Skilled Therapeutic Interventions/Progress Updates:  Time:  6644-0347 Time Calculation (min): 41 min Pain: No c/o pain Bed <> transfer with RW, supervision for safety. Gait in home environment x 50' with supervision. Gait x 200' controlled environment, supervision for safety only secondary to  cognition. Car transfer performed with supervision. Stairs x12 (6" steps) with close supervision, one rail Lt. Moderate verbal cues throughout. Pt continues to require verbal cues with most sit<>stand for safe UE position, continues with  decreased carryover from one session to another. Husband aware of safe UE positioning and need to reinforce at home.  Second Session Skilled Therapeutic Interventions/Progress Updates:  Time:  1610-9604 Time Calculation (min): 42 min Pain: No c/o pain NuStep for strength and conditioning 2 x 4 min level 6 with rest break between bouts. Dynamic balance in kitchen reaching for high and low objects in cabinets with 1 UE support. Sit <> stand from low armless chairs and couch, verbal cues for UE positioning. Pt required multiple attempts initially.  Gait with RW in controlled environment x 200' with supervision. Gait without RW x 225' with min assist increasing need for assist with fatigue. Increased lateral sway noted with out device. Pt with no further questions for home. Husband will need to provide supervision with gait and mobility secondary to impaired cognition, recommend RW for gait at this time to decrease risk of falls.    See FIM for current functional status  Audrey Obrien 04/18/2012, 6:51 PM

## 2012-04-18 NOTE — Progress Notes (Signed)
Patient ID: Audrey Obrien, female   DOB: Feb 08, 1931, 78 y.o.   MRN: [id number]  ROS otherwise negative No new complaints. Feels like she's ready to go home! Objective: Vital Signs: Blood pressure 135/77, pulse 78, temperature 97.8 F (36.6 C), temperature source Oral, resp. rate 18, height 5\' 2"  (1.575 m), weight 49.9 kg (110 lb 0.2 oz), SpO2 96.00%. No results found. No results found for this basename: WBC:2,HGB:2,HCT:2,PLT:2 in the last 72 hours  CBG (last 3)    Wt Readings from Last 3 Encounters:  04/12/12 49.9 kg (110 lb 0.2 oz)  04/03/12 51.8 kg (114 lb 3.2 oz)  11/22/11 50.803 kg (112 lb)    Physical Exam:  General appearance: alert, cooperative, appears stated age and no distress Head: Normocephalic, without obvious abnormality, atraumatic Eyes:  left sclera appears improved. Prosthetic right eye Ears: normal  external ear canals both ears Nose: Nares normal. Septum midline. Mucosa normal. No drainage or sinus tenderness. Throat: lips, mucosa, and tongue normal; teeth and gums normal Neck: no adenopathy, no carotid bruit, no JVD, supple, symmetrical, trachea midline and thyroid not enlarged, symmetric, no tenderness/mass/nodules Back: symmetric, no curvature. ROM normal. No CVA tenderness. Resp: clear to auscultation bilaterally Cardio: regular rate and rhythm, S1, S2 normal, no murmur, click, rub or gallop GI: soft, non-tender; bowel sounds normal; no masses,  no organomegaly Extremities: extremities normal, atraumatic, no cyanosis or edema Pulses: 2+ and symmetric Skin: Skin color, texture, turgor normal. No rashes or lesions Neurologic: resting tremor, pill rolling at times, undulating speech.  Moves all 4's.  (3-4/5). Walks with narrow base of support and shuffling gait. Improving vision thru the left eye. Very alert Incision/Wound: clean and intact    Assessment/Plan: 1. Functional deficits secondary to seizure disorder/gait disorder. ?PD which require 3+ hours per  day of interdisciplinary therapy in a comprehensive inpatient rehab setting. Physiatrist is providing close team supervision and 24 hour management of active medical problems listed below. Physiatrist and rehab team continue to assess barriers to discharge/monitor patient progress toward functional and medical goals.  FIM: FIM - Bathing Bathing Steps Patient Completed: Chest;Right Arm;Left Arm;Abdomen;Front perineal area;Buttocks;Right upper leg;Left upper leg;Left lower leg (including foot);Right lower leg (including foot) Bathing: 5: Supervision: Safety issues/verbal cues  FIM - Upper Body Dressing/Undressing Upper body dressing/undressing steps patient completed: Thread/unthread right bra strap;Thread/unthread left bra strap;Thread/unthread right sleeve of pullover shirt/dresss;Thread/unthread left sleeve of pullover shirt/dress;Put head through opening of pull over shirt/dress;Pull shirt over trunk Upper body dressing/undressing: 5: Supervision: Safety issues/verbal cues FIM - Lower Body Dressing/Undressing Lower body dressing/undressing steps patient completed: Thread/unthread right underwear leg;Thread/unthread left underwear leg;Pull underwear up/down;Pull pants up/down;Thread/unthread left pants leg;Thread/unthread right pants leg;Fasten/unfasten pants;Don/Doff right sock;Don/Doff left sock;Don/Doff right shoe;Fasten/unfasten left shoe;Fasten/unfasten right shoe;Don/Doff left shoe Lower body dressing/undressing: 5: Supervision: Safety issues/verbal cues  FIM - Toileting Toileting steps completed by patient: Adjust clothing prior to toileting;Performs perineal hygiene;Adjust clothing after toileting Toileting Assistive Devices: Grab bar or rail for support Toileting: 5: Supervision: Safety issues/verbal cues  FIM - Diplomatic Services operational officer Devices: Bedside commode;Elevated toilet seat Toilet Transfers: 5-To toilet/BSC: Supervision (verbal cues/safety issues);5-From  toilet/BSC: Supervision (verbal cues/safety issues)  FIM - Press photographer Assistive Devices: Bed rails Bed/Chair Transfer: 6: Sit > Supine: No assist;5: Bed > Chair or W/C: Supervision (verbal cues/safety issues)  FIM - Locomotion: Wheelchair Distance: 15' Locomotion: Wheelchair: 0: Activity did not occur FIM - Locomotion: Ambulation Locomotion: Ambulation Assistive Devices: Designer, industrial/product Ambulation/Gait Assistance: 5: Supervision Locomotion: Ambulation:  5: Travels 150 ft or more with supervision/safety issues  Comprehension Comprehension Mode: Auditory Comprehension: 5-Understands complex 90% of the time/Cues < 10% of the time  Expression Expression Mode: Verbal Expression Assistive Devices:  (none) Expression: 5-Expresses basic needs/ideas: With no assist  Social Interaction Social Interaction: 6-Interacts appropriately with others with medication or extra time (anti-anxiety, antidepressant).  Problem Solving Problem Solving Mode: Not assessed Problem Solving: 5-Solves basic 90% of the time/requires cueing < 10% of the time  Memory Memory: 5-Recognizes or recalls 90% of the time/requires cueing < 10% of the time  1. Deconditioning/hypoxic encephalopathy/Dilantin toxicity  2. DVT Prophylaxis/Anticoagulation: Subcutaneous Lovenox. Monitor platelet counts any signs of bleeding   -continue teds 3. seizure disorder. Dilantin increased to 100 qam and 200qpm. Check level in am tomorrow prior to dc home. 4. Benign essential tremor. Inderal 80 mg twice a day and primidone. Patient has seen Dr. Sandria Manly in the past. Exam and gait are consistent with parkinson's disease in my opinion..  5. History of corneal transplant 3 months ago at [facility]. Will add saline drops for eye irritation. Needs to have eye gtts.  -not sure if the abx mentioned above is related to her surgery. Don'Obrien know that she would need an abx at this point. 6. Hyponatremia. Stable in  the mid 120's 7. Positive UA- empiric cipro for ecoli. Sensitivity pending    LOS (Days) 14 A FACE TO FACE EVALUATION WAS PERFORMED  Audrey Obrien 04/18/2012, 8:23 AM

## 2012-04-19 LAB — HEPATIC FUNCTION PANEL
ALT: 7 U/L (ref 0–35)
AST: 13 U/L (ref 0–37)
Albumin: 2.6 g/dL — ABNORMAL LOW (ref 3.5–5.2)
Alkaline Phosphatase: 98 U/L (ref 39–117)
Total Bilirubin: 0.2 mg/dL — ABNORMAL LOW (ref 0.3–1.2)

## 2012-04-19 LAB — PHENYTOIN LEVEL, TOTAL: Phenytoin Lvl: 9.5 ug/mL — ABNORMAL LOW (ref 10.0–20.0)

## 2012-04-19 LAB — URINE CULTURE: Colony Count: >=100000

## 2012-04-19 MED ORDER — PHENYTOIN SODIUM EXTENDED 100 MG PO CAPS: 100.0000 mg | ORAL_CAPSULE | Freq: Every day | ORAL | Status: DC

## 2012-04-19 MED ORDER — ALUM & MAG HYDROXIDE-SIMETH 200-200-20 MG/5ML PO SUSP: 30.0000 mL | Freq: Four times a day (QID) | ORAL | Status: DC | PRN

## 2012-04-19 MED ORDER — HYDROCODONE-ACETAMINOPHEN 5-325 MG PO TABS: 1.0000 | ORAL_TABLET | Freq: Four times a day (QID) | ORAL | Status: AC | PRN

## 2012-04-19 MED ORDER — PHENYTOIN SODIUM EXTENDED 200 MG PO CAPS: 200.0000 mg | ORAL_CAPSULE | Freq: Every day | ORAL | Status: DC

## 2012-04-19 MED ORDER — CIPROFLOXACIN HCL 250 MG PO TABS: 250.0000 mg | ORAL_TABLET | Freq: Two times a day (BID) | ORAL | Status: AC

## 2012-04-19 MED ORDER — NAPHAZOLINE-PHENIRAMINE 0.025-0.3 % OP SOLN
1.0000 [drp] | Freq: Four times a day (QID) | OPHTHALMIC | Status: AC | PRN
Start: 2012-04-19 — End: 2012-04-29

## 2012-04-19 NOTE — Progress Notes (Signed)
Patient ID: Audrey Obrien, female   DOB: [birth year], 78 y.o.   MRN: [id number] Patient ID: Audrey Obrien, female   DOB: 06-29-1931, 78 y.o.   MRN: [id number]  ROS otherwise negative Dyspepsia this am Objective: Vital Signs: Blood pressure 113/61, pulse 72, temperature 97.7 F (36.5 C), temperature source Oral, resp. rate 16, height 5\' 2"  (1.575 m), weight 49.9 kg (110 lb 0.2 oz), SpO2 95.00%. No results found. No results found for this basename: WBC:2,HGB:2,HCT:2,PLT:2 in the last 72 hours  CBG (last 3)    Wt Readings from Last 3 Encounters:  04/12/12 49.9 kg (110 lb 0.2 oz)  04/03/12 51.8 kg (114 lb 3.2 oz)  11/22/11 50.803 kg (112 lb)    Physical Exam:  General appearance: alert, cooperative, appears stated age and no distress Head: Normocephalic, without obvious abnormality, atraumatic Eyes:  left sclera appears improved. Prosthetic right eye Ears: normal  external ear canals both ears Nose: Nares normal. Septum midline. Mucosa normal. No drainage or sinus tenderness. Throat: lips, mucosa, and tongue normal; teeth and gums normal Neck: no adenopathy, no carotid bruit, no JVD, supple, symmetrical, trachea midline and thyroid not enlarged, symmetric, no tenderness/mass/nodules Back: symmetric, no curvature. ROM normal. No CVA tenderness. Resp: clear to auscultation bilaterally Cardio: regular rate and rhythm, S1, S2 normal, no murmur, click, rub or gallop GI: soft, non-tender; bowel sounds normal; no masses,  no organomegaly Extremities: extremities normal, atraumatic, no cyanosis or edema Pulses: 2+ and symmetric Skin: Skin color, texture, turgor normal. No rashes or lesions Neurologic: resting tremor, pill rolling at times, undulating speech.  Moves all 4's.  (3-4/5). Walks with narrow base of support and shuffling gait. Improving vision thru the left eye. Very alert Incision/Wound: clean and intact    Assessment/Plan: 1. Functional deficits secondary to seizure disorder/gait  disorder. ?PD which require 3+ hours per day of interdisciplinary therapy in a comprehensive inpatient rehab setting. Physiatrist is providing close team supervision and 24 hour management of active medical problems listed below. Physiatrist and rehab team continue to assess barriers to discharge/monitor patient progress toward functional and medical goals.  dc home today FIM: FIM - Bathing Bathing Steps Patient Completed: Chest;Right Arm;Left Arm;Abdomen;Front perineal area;Buttocks;Right upper leg;Left upper leg;Left lower leg (including foot);Right lower leg (including foot) Bathing: 5: Supervision: Safety issues/verbal cues  FIM - Upper Body Dressing/Undressing Upper body dressing/undressing steps patient completed: Thread/unthread right bra strap;Thread/unthread left bra strap;Thread/unthread right sleeve of pullover shirt/dresss;Thread/unthread left sleeve of pullover shirt/dress;Put head through opening of pull over shirt/dress;Pull shirt over trunk Upper body dressing/undressing: 5: Supervision: Safety issues/verbal cues FIM - Lower Body Dressing/Undressing Lower body dressing/undressing steps patient completed: Thread/unthread right underwear leg;Thread/unthread left underwear leg;Pull underwear up/down;Pull pants up/down;Thread/unthread left pants leg;Thread/unthread right pants leg;Fasten/unfasten pants;Don/Doff right sock;Don/Doff left sock;Don/Doff right shoe;Fasten/unfasten left shoe;Fasten/unfasten right shoe;Don/Doff left shoe Lower body dressing/undressing: 5: Supervision: Safety issues/verbal cues  FIM - Toileting Toileting steps completed by patient: Adjust clothing prior to toileting;Performs perineal hygiene;Adjust clothing after toileting Toileting Assistive Devices: Grab bar or rail for support Toileting: 5: Supervision: Safety issues/verbal cues  FIM - Diplomatic Services operational officer Devices: Elevated toilet seat;Grab bars Toilet Transfers: 4-To toilet/BSC:  Min A (steadying Pt. > 75%);4-From toilet/BSC: Min A (steadying Pt. > 75%)  FIM - Bed/Chair Transfer Bed/Chair Transfer Assistive Devices: Therapist, occupational: 6: Supine > Sit: No assist;6: Sit > Supine: No assist;5: Bed > Chair or W/C: Supervision (verbal cues/safety issues);5: Chair or W/C > Bed: Supervision (verbal cues/safety issues)  FIM -  Locomotion: Wheelchair Distance: 15' Locomotion: Wheelchair: 0: Activity did not occur FIM - Locomotion: Ambulation Locomotion: Ambulation Assistive Devices: Designer, industrial/product Ambulation/Gait Assistance: 5: Supervision;4: Min assist Locomotion: Ambulation: 5: Travels 150 ft or more with supervision/safety issues  Comprehension Comprehension Mode: Auditory Comprehension: 5-Follows basic conversation/direction: With extra time/assistive device  Expression Expression Mode: Verbal Expression Assistive Devices:  (none) Expression: 5-Expresses basic needs/ideas: With extra time/assistive device  Social Interaction Social Interaction: 6-Interacts appropriately with others with medication or extra time (anti-anxiety, antidepressant).  Problem Solving Problem Solving Mode: Not assessed Problem Solving: 5-Solves basic 90% of the time/requires cueing < 10% of the time  Memory Memory: 4-Recognizes or recalls 75 - 89% of the time/requires cueing 10 - 24% of the time  1. Deconditioning/hypoxic encephalopathy/Dilantin toxicity  2. DVT Prophylaxis/Anticoagulation: Subcutaneous Lovenox. Monitor platelet counts any signs of bleeding   -continue teds 3. seizure disorder. Dilantin increased to 100 qam and 200qpm. Check level in am tomorrow prior to dc home. 4. Benign essential tremor. Inderal 80 mg twice a day and primidone. Patient has seen Dr. Sandria Manly in the past. Exam and gait are consistent with parkinson's disease in my opinion..  5. History of corneal [facility] 3 [facility] ago at [facility]. Will add saline drops for eye irritation. Needs to  have eye gtts.  -not sure if the abx mentioned above is related to her surgery. Don't know that she would need an abx at this point. 6. Hyponatremia. Stable in the mid 120's 7. UTI -- empiric cipro for ecoli. Sensitivity still pending    LOS (Days) 15 A FACE TO FACE EVALUATION WAS PERFORMED  Raigen Jagielski T 04/19/2012, 8:32 AM

## 2012-04-19 NOTE — Progress Notes (Signed)
Pt discharged home with husband, discharge instructions and prescriptions given by Harvel Ricks PA. Pt and husband verbalized an understanding and denied any questions

## 2012-04-19 NOTE — Progress Notes (Signed)
Social Work  Discharge Note  The overall goal for the admission was met for:   Discharge location: Yes - home with husband  Length of Stay: Yes - 15 days  Discharge activity level: Yes - supervision overall  Home/community participation: Yes  Services provided included: MD, RD, PT, OT, SLP, RN, CM, TR, Pharmacy and SW  Financial Services: Medicare and Private Insurance: Tricare  Follow-up services arranged: Outpatient: PT and OT via [facility] and Patient/Family has no preference for HH/DME agencies  Comments (or additional information):  Patient/Family verbalized understanding of follow-up arrangements: Yes  Individual responsible for coordination of the follow-up plan: husband  Confirmed correct DME delivered: NA  Darcus Edds

## 2012-04-19 NOTE — Patient Care Conference (Signed)
Inpatient RehabilitationTeam Conference Note Date: 04/18/2012   Time: 3:10 PM    Patient Name: Audrey Obrien      Medical Record Number: [id number]  Date of Birth: [birth year] Sex: Female         Room/Bed: 4140/4140-01 Payor Info: Payor: MEDICARE  Plan: MEDICARE PART A AND B  Product Type: *No Product type*     Admitting Diagnosis: Hypoxic Encephalopathy, Dilantin toxic  Admit Date/Time:  04/04/2012  4:06 PM Admission Comments: No comment available   Primary Diagnosis:  Physical deconditioning Principal Problem: Physical deconditioning  Patient Active Problem List  Diagnoses Date Noted  . MAIC (mycobacterium avium-intracellulare complex) 04/16/2012  . Physical deconditioning 04/05/2012  . Dilantin toxicity 03/31/2012  . UTI (lower urinary tract infection) 03/28/2012  . Hyponatremia 03/28/2012  . Dehydration 03/28/2012  . HTN (hypertension) 03/28/2012  . Seizure disorder 03/28/2012  . Scleroderma 03/28/2012  . Dysphagia 03/28/2012  . Weakness generalized 03/28/2012  . Peripheral edema 03/28/2012  . Chronic cough 09/15/2011  . Bronchiectasis without acute exacerbation 09/15/2011  . Chronic allergic rhinitis 09/15/2011  . GERD (gastroesophageal reflux disease) 09/15/2011    Expected Discharge Date: Expected Discharge Date: 04/19/12  Team Members Present: Physician: Dr. Faith Rogue Case Manager Present: Melanee Spry, RN Social Worker Present: Amada Jupiter, LCSW Nurse Present: Rosalio Macadamia, RN PT Present: Other (comment) Sherrine Maples) OT Present: Bretta Bang, OT;Jennifer Katrinka Blazing, OT;Ardis Rowan, COTA SLP Present: Feliberto Gottron, SLP     Current Status/Progress Goal Weekly Team Focus  Medical   no change  no change  see prior   Bowel/Bladder   Continent of bowel and bladder; LBM 04/17/12  Continent of bowel and bladder.      Swallow/Nutrition/ Hydration   Dys. 3 with thin liquids, Min A  Min A  D/C tomorrow, family education complete   ADL's   Supervision overall, min  verbal cues with transfers for safety  Supervision overall; min A shower transfer      Mobility   Supervision for most mobility secondary to impaired cognition.     Has met goals, ready to D/C   Communication             Safety/Cognition/ Behavioral Observations  min A  Min A  D/C tomorrow, Family edu. complete   Pain   Denies pain.  </=2      Skin   Buttocks red and blanchable.  No new skin breakdown.         *See Interdisciplinary Assessment and Plan and progress notes for long and short-term goals  Barriers to Discharge: see prior    Possible Resolutions to Barriers:  see prior    Discharge Planning/Teaching Needs:  Home with husband who is to provide 24/7 assistance - education being completed.      Team Discussion: Goals met-Completing family ed.  Ready for d/c.   Revisions to Treatment Plan: none    Continued Need for Acute Rehabilitation Level of Care: The patient requires daily medical management by a physician with specialized training in physical medicine and rehabilitation for the following conditions: Daily direction of a multidisciplinary physical rehabilitation program to ensure safe treatment while eliciting the highest outcome that is of practical value to the patient.: Yes Daily medical management of patient stability for increased activity during participation in an intensive rehabilitation regime.: Yes Daily analysis of laboratory values and/or radiology reports with any subsequent need for medication adjustment of medical intervention for : Neurological problems;Other  Brock Ra 04/19/2012, 10:10 AM

## 2012-04-19 NOTE — Discharge Instructions (Signed)
Inpatient Rehab Discharge Instructions  Audrey Obrien Discharge date and time: No discharge date for patient encounter.   Activities/Precautions/ Functional Status: Activity: activity as tolerated Diet: Mechanical soft Wound Care: none needed Functional status:  ___ No restrictions     ___ Walk up steps independently __x_ 24/7 supervision/assistance   __x_ Walk up steps with assistance ___ Intermittent supervision/assistance  ___ Bathe/dress independently ___ Walk with walker     ___ Bathe/dress with assistance ___ Walk Independently    ___ Shower independently __x_ Walk with assistance    __x_ Shower with assistance ___ No alcohol     ___ Return to work/school ________  COMMUNITY REFERRALS UPON DISCHARGE:    COMMUNITY REFERRALS UPON DISCHARGE:    Outpatient: PT     OT                Agency: [facility] Phone: [phone]                Appointment Date/Time: 5/21 @ 8:00 - 10:00 (arrive 7:45)  Special Instructions:    My questions have been answered and I understand these instructions. I will adhere to these goals and the provided educational materials after my discharge from the hospital.  Patient/Caregiver Signature _______________________________ Date __________  Clinician Signature _______________________________________ Date __________  Please bring this form and your medication list with you to all your follow-up doctor's appointments.

## 2012-04-19 NOTE — Discharge Summary (Signed)
NAMECADIE, SORCI NO.:  [id number]  MEDICAL RECORD NO.:  [id number]  LOCATION:  4140                         FACILITY:  [facility]  PHYSICIAN:  Ranelle Oyster, M.D.DATE OF BIRTH:  1931/11/25  DATE OF ADMISSION:  04/04/2012 DATE OF DISCHARGE:  04/19/2012                              DISCHARGE SUMMARY   DISCHARGE DIAGNOSES: 1. Deconditioning-hypoxic encephalopathy-Dilantin toxicity. 2. Subcutaneous Lovenox for deep vein thrombosis prophylaxis. 3. Seizure disorder. 4. Benign essential tremor. 5. History of corneal transplant. 6. Hyponatremia.   This 78 year old right-handed female was admitted on April 23, with altered mental status.  The patient noted urinary tract symptoms and dysuria for the past 3 weeks with urine culture negative.  Per patient's husband, she was recently admitted to [facility] for corneal transplant 3 months ago where she had difficult hospitalization.  She was coded, was unresponsive, placed on mechanical ventilation.  She had 3 seizures in the ICU followed by neurology services, placed on Dilantin as well as Primidone and later discharged to home.  She had no further seizures, and her husband noted increased lethargy at home.  The patient had prior history of tremor, had seen neurology in the past.  Her husband states that she was never diagnosed with Parkinson disease although he suspects it.  She has not seen her neurologist Dr. Sandria Manly for quite some time.  The patient's husband states the patient has really not regained her usual level of function since her ICU hospitalization earlier to here.  Noted Dilantin level of 32.3.  Cranial CT scan showed no acute changes.  Echocardiogram with ejection fraction of 60% without emboli.  Venous Dopplers with no evidence of DVT.  Subcutaneous Lovenox added for DVT prophylaxis.  Followup urine culture showed 50,000 multibacterial.  Her Dilantin was held, later resumed on April 29, at 200 mg  daily with Dilantin level of 6 on April 04, 2012.  She was admitted for comprehensive rehab program.  PAST MEDICAL HISTORY:  See discharge diagnoses.  SOCIAL HISTORY:  Lives with spouse, 1 level home, 2 steps to entry.  FUNCTIONAL HISTORY:  Prior to admission, was independent.  She does not drive.  FUNCTIONAL STATUS:  Upon admission to rehab services, was moderate assist to roll in the bed, +2 total assist ambulate 80 feet with a rolling walker.  PHYSICAL EXAM:  VITAL SIGNS: Blood pressure 113/61, pulse 58, respirations 16, temperature 97.7. GENERAL: This was an alert female, oriented x3.  Follows commands. Speech is a bit halting but fully intelligible. LUNGS: Clear to auscultation. CARDIAC: Regular rate and rhythm. ABDOMEN: Soft, nontender.  Good bowel sounds.  There is a resting tremor in bilateral upper extremities, almost a pill-rolling tremor.  REHABILITATION HOSPITAL COURSE:  The patient was admitted to inpatient rehab services with therapies initiated on a 3 hours daily basis consisting of physical therapy, occupational therapy, and rehabilitation nursing.  The following issues were addressed during the patient's rehabilitation stay.  Pertaining to Mrs. Captain deconditioning, hypoxic encephalopathy, seizure disorder remained stable.  Her Dilantin had been adjusted to 300 mg daily with latest Dilantin level of 9.5.  No further seizure activity noted.  It was recommended to follow up  Dilantin level in 1 week.  She did have a history of benign essential tremor.  She remained on propranolol 80 mg b.i.d. as well as Primidone 50 mg at bedtime.  Noted recent corneal transplant 3 months ago, did not see [facility], no complaints noted.  She would follow up with ophthalmology as she was directed.  Noted persistent hyponatremia with latest sodium of 130 and stable.  Follow up liver function studies while on Dilantin were unremarkable.  The patient maintained on  subcutaneous Lovenox throughout her rehab course for DVT prophylaxis.  A urine study was completed on May 12, preliminary of E. coli.  She had been placed on Cipro x7 days.  She denied any dysuria or hematuria.  The patient received weekly collaborative interdisciplinary team conferences to discuss estimated length of stay, family teaching, and any barriers to her discharge.  She was maintained on mechanical soft diet.  She was supervision for bathing and lower body dressing, minimal assist upper body, minimal assist to ambulate with a rolling walker, needing some subtle cues.  Needed some cues for complex problem solving.  Her husband will provide the necessary 24-hour supervision as needed, full family teaching was completed.  Discharge took place on May 15.  DISCHARGE MEDICATIONS:  Included: 1. Aspirin 81 mg daily. 2. Vitamin D 1000 units daily. 3. Cipro 250 mg twice daily until Apr 23, 2012, and stop. 4. Norco 1 tablet every 6 hours as needed for pain. 5. Protonix 80 mg daily. 6. Dilantin extended release 100 mg a.m., 200 mg at bedtime. 7. Primidone 100 mg daily, 50 mg at bedtime. 8. Inderal 80 mg b.i.d.  DIET:  Her diet was mechanical soft.  SPECIAL INSTRUCTIONS:  The patient should follow up with her primary MD, Dr. Theressa Millard, call for appointment.  Dr. Faith Rogue as needed. I should make a followup to Dr. Avie Echevaria, her neurologist, in regard for history of seizure disorder.  It was recommended to follow up Dilantin level in approximately 1 week, this could be done with her primary MD or with Dr. Sandria Manly.  Home health therapies had been arranged as per rehab services.     Mariam Dollar, P.A.   ______________________________ Ranelle Oyster, M.D.    DA/MEDQ  D:  04/19/2012  T:  04/19/2012  Job:  [id number]  cc:   Theressa Millard, M.D. Genene Churn. Love, M.D.

## 2012-04-19 NOTE — Discharge Summary (Signed)
  Discharge summary job # [id number]

## 2012-04-25 ENCOUNTER — Ambulatory Visit: Payer: Medicare Other | Admitting: Physical Therapy

## 2012-04-25 ENCOUNTER — Ambulatory Visit: Payer: Medicare Other | Attending: Physical Medicine & Rehabilitation | Admitting: Occupational Therapy

## 2012-04-25 DIAGNOSIS — Z5189 Encounter for other specified aftercare: Secondary | ICD-10-CM | POA: Insufficient documentation

## 2012-04-25 DIAGNOSIS — R269 Unspecified abnormalities of gait and mobility: Secondary | ICD-10-CM | POA: Insufficient documentation

## 2012-04-25 DIAGNOSIS — M6281 Muscle weakness (generalized): Secondary | ICD-10-CM | POA: Insufficient documentation

## 2012-04-25 DIAGNOSIS — R4189 Other symptoms and signs involving cognitive functions and awareness: Secondary | ICD-10-CM | POA: Insufficient documentation

## 2012-04-25 DIAGNOSIS — R279 Unspecified lack of coordination: Secondary | ICD-10-CM | POA: Insufficient documentation

## 2012-04-25 DIAGNOSIS — R41842 Visuospatial deficit: Secondary | ICD-10-CM | POA: Insufficient documentation

## 2012-04-27 ENCOUNTER — Telehealth: Payer: Self-pay | Admitting: Emergency Medicine

## 2012-04-27 ENCOUNTER — Other Ambulatory Visit: Payer: Self-pay | Admitting: Internal Medicine

## 2012-04-27 DIAGNOSIS — Z1231 Encounter for screening mammogram for malignant neoplasm of breast: Secondary | ICD-10-CM

## 2012-04-27 NOTE — Telephone Encounter (Signed)
There is a pending order placed on 09-2011 for f/u CT for March 2013 but the pt did not have it done at that time. Pt is calling and asking that this be schedule along with rov. Dr. Kavin Leech instructions are as follows:  Bronchiectasis without acute exacerbation - BYRUM,ROBERT S., MD 09/15/2011 2:28 PM Signed  - follow cx data to completion; I believe the pseudomonas is a colonizer, want to r/o [facility]  - repeat CT scan in march 2013  - rov after  I advised the pt spouse that we will place the order and they will be contacted by [facility]. The original order was cancelled out by epic due to time limit. Order placed.Carron Curie, CMA

## 2012-04-27 NOTE — Telephone Encounter (Signed)
Pt has scheduled f/u CT for 05-02-12 and is supposed to have rov with you after. Next available is not until 06/22/12. Pt does not want to wait that long. Please advise. Carron Curie, CMA

## 2012-05-02 ENCOUNTER — Other Ambulatory Visit: Payer: Medicare Other

## 2012-05-02 NOTE — Telephone Encounter (Signed)
[email or web address]  

## 2012-05-02 NOTE — Telephone Encounter (Signed)
Please overbook her so we can discuss. thanks

## 2012-05-02 NOTE — Telephone Encounter (Signed)
Pt scheduled for 05-22-12 at 1:30pm. Carron Curie, CMA

## 2012-05-02 NOTE — Telephone Encounter (Signed)
Returning call can be reached at [phone].Audrey Obrien

## 2012-05-03 ENCOUNTER — Ambulatory Visit (INDEPENDENT_AMBULATORY_CARE_PROVIDER_SITE_OTHER)
Admission: RE | Admit: 2012-05-03 | Discharge: 2012-05-03 | Disposition: A | Discharge status: Home / Self Care | Payer: Medicare Other | Source: Ambulatory Visit | Attending: Emergency Medicine | Admitting: Emergency Medicine

## 2012-05-03 DIAGNOSIS — J479 Bronchiectasis, uncomplicated: Secondary | ICD-10-CM

## 2012-05-04 ENCOUNTER — Ambulatory Visit: Payer: Medicare Other | Admitting: Physical Therapy

## 2012-05-09 ENCOUNTER — Ambulatory Visit: Payer: Medicare Other | Attending: Physical Medicine & Rehabilitation | Admitting: Physical Therapy

## 2012-05-09 ENCOUNTER — Ambulatory Visit: Payer: Medicare Other | Admitting: Occupational Therapy

## 2012-05-09 DIAGNOSIS — R41842 Visuospatial deficit: Secondary | ICD-10-CM | POA: Insufficient documentation

## 2012-05-09 DIAGNOSIS — M6281 Muscle weakness (generalized): Secondary | ICD-10-CM | POA: Insufficient documentation

## 2012-05-09 DIAGNOSIS — R269 Unspecified abnormalities of gait and mobility: Secondary | ICD-10-CM | POA: Insufficient documentation

## 2012-05-09 DIAGNOSIS — Z5189 Encounter for other specified aftercare: Secondary | ICD-10-CM | POA: Insufficient documentation

## 2012-05-09 DIAGNOSIS — R279 Unspecified lack of coordination: Secondary | ICD-10-CM | POA: Insufficient documentation

## 2012-05-09 DIAGNOSIS — R4189 Other symptoms and signs involving cognitive functions and awareness: Secondary | ICD-10-CM | POA: Insufficient documentation

## 2012-05-11 ENCOUNTER — Encounter: Payer: Medicare Other | Admitting: Occupational Therapy

## 2012-05-11 ENCOUNTER — Ambulatory Visit: Payer: Medicare Other | Admitting: *Deleted

## 2012-05-15 ENCOUNTER — Ambulatory Visit
Admission: RE | Admit: 2012-05-15 | Discharge: 2012-05-15 | Disposition: A | Discharge status: Home / Self Care | Payer: Medicare Other | Source: Ambulatory Visit | Attending: Internal Medicine | Admitting: Internal Medicine

## 2012-05-15 DIAGNOSIS — Z1231 Encounter for screening mammogram for malignant neoplasm of breast: Secondary | ICD-10-CM

## 2012-05-16 ENCOUNTER — Ambulatory Visit: Payer: Medicare Other | Admitting: Physical Therapy

## 2012-05-16 ENCOUNTER — Encounter: Payer: Medicare Other | Admitting: Occupational Therapy

## 2012-05-18 ENCOUNTER — Ambulatory Visit: Payer: Medicare Other | Admitting: Occupational Therapy

## 2012-05-18 ENCOUNTER — Telehealth: Payer: Self-pay

## 2012-05-18 ENCOUNTER — Ambulatory Visit: Payer: Medicare Other | Admitting: Physical Therapy

## 2012-05-18 MED ORDER — PHENYTOIN SODIUM EXTENDED 100 MG PO CAPS: 100.0000 mg | ORAL_CAPSULE | Freq: Three times a day (TID) | ORAL | Status: DC

## 2012-05-18 NOTE — Telephone Encounter (Signed)
Pt needs refill on dilantin 100 mg #90 this was prescribed by Mariam Dollar.  Call into [facility].

## 2012-05-22 ENCOUNTER — Ambulatory Visit (INDEPENDENT_AMBULATORY_CARE_PROVIDER_SITE_OTHER): Payer: Medicare Other | Admitting: Emergency Medicine

## 2012-05-22 ENCOUNTER — Encounter: Payer: Self-pay | Admitting: Emergency Medicine

## 2012-05-22 VITALS — BP 130/78 | HR 55 | Temp 97.9°F | Ht 62.0 in | Wt 110.6 lb

## 2012-05-22 DIAGNOSIS — J479 Bronchiectasis, uncomplicated: Secondary | ICD-10-CM

## 2012-05-22 DIAGNOSIS — J309 Allergic rhinitis, unspecified: Secondary | ICD-10-CM

## 2012-05-22 MED ORDER — ETHAMBUTOL HCL 400 MG PO TABS: 400.0000 mg | ORAL_TABLET | Freq: Three times a day (TID) | ORAL | Status: DC

## 2012-05-22 NOTE — Assessment & Plan Note (Signed)
Continue loratadine Add chlorpheniramine or brompheniramine

## 2012-05-22 NOTE — Patient Instructions (Addendum)
We will continue clarithromycin for another 6 months.  Add back ethambutol 400mg   Three times a day for the next 6 months. Call our office if the medication gives you trouble.  Continue your claritin daily Use over-the-counter chlorpheniramine or brompheniramine up to every 6 hours to help with your congestion.  Follow with Dr Delton Coombes in 6 months or sooner if you have any problems We will discuss the timing of a potential repeat CT scan in 6 months.

## 2012-05-22 NOTE — Assessment & Plan Note (Signed)
CT scan appears to be slightly better but with L nodule. Has been on clarithro x 5 months. Not entirely clear that she has been fully treated.  - continue clarithro x 6 more months and try to add back the ethambutol for 6 months.  - discuss repeat CT scan at 6 month follow up.

## 2012-05-22 NOTE — Progress Notes (Signed)
Subjective:    Patient ID: Audrey Obrien, female    DOB: [birth year], 78 y.o.   MRN: [id number]  HPI 78 yo never smoker w hx scleroderma, HTN, GERD, chronic cough (2-3 yrs). 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 I saw her in the hospital 9/27 for cough and hemoptysis. CT scan of the chest 9/27 showed nodular parenchymal opacity within the RUL with a  'tree in bud ' appearance, progressed compared with 06/2011. FOB done 9/28 showed old blood in the RUL posterior segment, BAL has been smear negative for AFB and fungal (final cx pending). The bacterial cx grew sensitive pseudomonas. Cytology on BAL negative. She has nasal gtt and a lot of throat mucous. Has tried nasonex before, not sure it helped. Not currently on anything. Started mucinex a week ago.   ROV 10/12/11 -- scleroderma, chronic cough, bronchiectasis. BAL done 9/28 = MAIC. Started on ethambutol, rifampin, clarithromycin on 09/22/11. She has tolerated the antibiotics, has had a single episode diarrhea, has noticed a rash on her legs. Remains on the loratadine and fluticasone.  She believes that her mucous production is improved.   ROV 10/21/11 - scleroderma, chronic cough, bronchiectasis. Dx with MAIC on BAL. Started 3 drug rx but she developed rash, had to stop all 3 meds. Returns to discuss next plans. She says the rash is better, has low appetite.   ROV 11/22/11 -- scleroderma, chronic cough, bronchiectasis. Dx with MAIC on BAL. Attempted to start clarithro alone (due to rash on 3 drug rx), but we had to stop it due to lack of appetite, nausea. Stopped it 3 days ago.  ROV 05/22/12 -- scleroderma, chronic cough, bronchiectasis. Dx with MAIC on BAL 09/03/11. Was unable to tolerate 3 drug therapy as above. Repeaqt CT scan 6/12 shows little change, maybe some possible improvement in tree-bud pattern, a more prominent L sided nodule, likely inflammatory.  She restarted clarithro in  January, then added ethambutol 2 weeks later - but no longer taking. Has been on the clarithro for 5 months.  Her cough is dry, more rare than last visit.      Objective:   Physical Exam  Gen: Pleasant, well-nourished, in no distress,  normal affect  ENT: No lesions,  mouth clear,  oropharynx clear, gravel voice quality, some postnasal drip  Neck: No JVD, no TMG, no carotid bruits  Lungs: No use of accessory muscles, no dullness to percussion, clear without rales or rhonchi  Cardiovascular: RRR, heart sounds normal, no murmur or gallops, no peripheral edema  Musculoskeletal: No deformities, no cyanosis or clubbing  Neuro: alert, non focal, slightly slow to respond, mild tremor.   Skin: Warm, no lesions or rashes   CT scan chest 05/17/12:  Comparison: Chest CT 09/02/2011.  Findings:  Mediastinum: Heart size is borderline enlarged. Small amount of  pericardial fluid and/or thickening. No associated pericardial  calcification. This is unlikely to be of any hemodynamic  significance at this time. No pathologically enlarged mediastinal  or hilar lymph nodes. Please note that accurate exclusion of hilar  adenopathy is limited on noncontrast CT scans. Esophagus is  unremarkable in appearance. There is atherosclerosis of the  thoracic aorta, the great vessels of the mediastinum and the  coronary arteries, including calcified atherosclerotic plaque in  the left anterior descending and left circumflex coronary arteries.  Lungs/Pleura: There are again areas of bronchial wall thickening  with some mild cylindrical bronchiectasis and associated  peribronchovascular  micro and macro nodularity in the lungs,  predominantly in the right upper lobe. Overall appearance is very  similar to the prior study from 09/02/2011. In the anterior aspect  of the left lung (image 23 of series 3) there is an oblong cluster  of nodules that extends to the overlying pleura which have enlarged  compared to  the prior study, which may represent a developing  bronchocele. No more consolidative airspace disease. No pleural  effusions.  Upper Abdomen: Unremarkable.  Musculoskeletal: There are no aggressive appearing lytic or blastic  lesions noted in the visualized portions of the skeleton.  IMPRESSION:  1. 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 Today's study does demonstrate a new cluster of nodules in  the anterior aspect of the left upper lobe which is likely part of  this underlying process, and may develop represent a developing  bronchocele.  2. Atherosclerosis, including two-vessel coronary artery disease.  Please note that although the presence of coronary artery calcium  documents the presence of coronary artery disease, the severity of  this disease and any potential stenosis cannot be assessed on this  non-gated CT examination. Assessment for potential risk factor  modification, dietary therapy or pharmacologic therapy may be  warranted, if clinically indicated.  3. Small amount of pericardial fluid and/or thickening is similar  to the prior study, and unlikely to be of hemodynamic significance.     Assessment & Plan:  Bronchiectasis without acute exacerbation CT scan appears to be slightly better but with L nodule. Has been on clarithro x 5 months. Not entirely clear that she has been fully treated.  - continue clarithro x 6 more months and try to add back the ethambutol for 6 months.  - discuss repeat CT scan at 6 month follow up.   Chronic allergic rhinitis Continue loratadine Add chlorpheniramine or brompheniramine

## 2012-05-23 ENCOUNTER — Ambulatory Visit: Payer: Medicare Other | Admitting: Occupational Therapy

## 2012-05-23 ENCOUNTER — Ambulatory Visit: Payer: Medicare Other | Admitting: Physical Therapy

## 2012-05-23 ENCOUNTER — Telehealth: Payer: Self-pay | Admitting: Emergency Medicine

## 2012-05-23 NOTE — Telephone Encounter (Signed)
I spoke with Audrey Obrien and she states per spouse pt was to have another rx called in in addition to the Claritin. I advised her per instructions pt could take OTC chlorpheniramine.  She voiced her understanding and will call spouse to make aware

## 2012-05-25 ENCOUNTER — Ambulatory Visit: Payer: Medicare Other | Admitting: Occupational Therapy

## 2012-05-25 ENCOUNTER — Ambulatory Visit: Payer: Medicare Other | Admitting: Physical Therapy

## 2012-05-30 ENCOUNTER — Ambulatory Visit: Payer: Medicare Other | Admitting: Physical Therapy

## 2012-05-30 ENCOUNTER — Ambulatory Visit: Payer: Medicare Other | Admitting: Occupational Therapy

## 2012-06-01 ENCOUNTER — Ambulatory Visit: Payer: Medicare Other | Admitting: Occupational Therapy

## 2012-06-01 ENCOUNTER — Ambulatory Visit: Payer: Medicare Other | Admitting: Physical Therapy

## 2012-06-07 ENCOUNTER — Other Ambulatory Visit: Payer: Self-pay | Admitting: Emergency Medicine

## 2012-06-26 ENCOUNTER — Ambulatory Visit: Payer: Medicare Other | Admitting: Occupational Therapy

## 2012-06-26 ENCOUNTER — Ambulatory Visit: Payer: Medicare Other | Attending: Physical Medicine & Rehabilitation | Admitting: Physical Therapy

## 2012-06-26 DIAGNOSIS — R269 Unspecified abnormalities of gait and mobility: Secondary | ICD-10-CM | POA: Insufficient documentation

## 2012-06-26 DIAGNOSIS — R279 Unspecified lack of coordination: Secondary | ICD-10-CM | POA: Insufficient documentation

## 2012-06-26 DIAGNOSIS — Z5189 Encounter for other specified aftercare: Secondary | ICD-10-CM | POA: Insufficient documentation

## 2012-06-26 DIAGNOSIS — R4189 Other symptoms and signs involving cognitive functions and awareness: Secondary | ICD-10-CM | POA: Insufficient documentation

## 2012-06-26 DIAGNOSIS — R41842 Visuospatial deficit: Secondary | ICD-10-CM | POA: Insufficient documentation

## 2012-06-26 DIAGNOSIS — M6281 Muscle weakness (generalized): Secondary | ICD-10-CM | POA: Insufficient documentation

## 2012-06-29 ENCOUNTER — Ambulatory Visit: Payer: Medicare Other | Admitting: Occupational Therapy

## 2012-06-29 ENCOUNTER — Ambulatory Visit: Payer: Medicare Other | Admitting: Physical Therapy

## 2012-07-04 ENCOUNTER — Ambulatory Visit: Payer: Medicare Other | Admitting: Physical Therapy

## 2012-07-04 ENCOUNTER — Ambulatory Visit: Payer: Medicare Other | Admitting: Occupational Therapy

## 2012-07-06 ENCOUNTER — Other Ambulatory Visit: Payer: Self-pay | Admitting: Emergency Medicine

## 2012-07-06 ENCOUNTER — Ambulatory Visit: Payer: Medicare Other | Admitting: Occupational Therapy

## 2012-07-06 ENCOUNTER — Ambulatory Visit: Payer: Medicare Other | Attending: Physical Medicine & Rehabilitation | Admitting: Physical Therapy

## 2012-07-06 DIAGNOSIS — R279 Unspecified lack of coordination: Secondary | ICD-10-CM | POA: Insufficient documentation

## 2012-07-06 DIAGNOSIS — Z5189 Encounter for other specified aftercare: Secondary | ICD-10-CM | POA: Insufficient documentation

## 2012-07-06 DIAGNOSIS — M6281 Muscle weakness (generalized): Secondary | ICD-10-CM | POA: Insufficient documentation

## 2012-07-06 DIAGNOSIS — R4189 Other symptoms and signs involving cognitive functions and awareness: Secondary | ICD-10-CM | POA: Insufficient documentation

## 2012-07-06 DIAGNOSIS — R41842 Visuospatial deficit: Secondary | ICD-10-CM | POA: Insufficient documentation

## 2012-07-06 DIAGNOSIS — R269 Unspecified abnormalities of gait and mobility: Secondary | ICD-10-CM | POA: Insufficient documentation

## 2012-07-11 ENCOUNTER — Ambulatory Visit: Payer: Medicare Other | Admitting: Occupational Therapy

## 2012-07-11 ENCOUNTER — Ambulatory Visit: Payer: Medicare Other | Admitting: Physical Therapy

## 2012-07-13 ENCOUNTER — Ambulatory Visit: Payer: Medicare Other | Admitting: Physical Therapy

## 2012-07-13 ENCOUNTER — Ambulatory Visit: Payer: Medicare Other | Admitting: Occupational Therapy

## 2012-07-24 ENCOUNTER — Telehealth: Payer: Self-pay | Admitting: Emergency Medicine

## 2012-07-24 NOTE — Telephone Encounter (Signed)
ATC pt's spouse, NA and no option to leave a msg, [facility]

## 2012-07-25 MED ORDER — CLARITHROMYCIN 500 MG PO TABS: 500.0000 mg | ORAL_TABLET | Freq: Two times a day (BID) | ORAL | Status: DC

## 2012-07-25 NOTE — Telephone Encounter (Signed)
Per 6.17.13 ov with RB:  Patient Instructions     We will continue clarithromycin for another 6 months.  Add back ethambutol 400mg  Three times a day for the next 6 months. Call our office if the medication gives you trouble.  Continue your claritin daily  Use over-the-counter chlorpheniramine or brompheniramine up to every 6 hours to help with your congestion.  Follow with Dr Delton Coombes in 6 months or sooner if you have any problems  We will discuss the timing of a potential repeat CT scan in 6 months.     ------------- Called spoke with pt's spouse who reported that a refill on pt's clarithromycin 500mg  BID was filled in 8.1.13, but pt was not given any refills.  Is requesting a refill be sent to [facility].  Advised that this can be sent electronically, rather than mailed to pt's home for them to mail.  Mr Nolte requests this be done instead.  Since it clearly states in ov pt instructions that pt is to continue this medication, will refill for 90days - this will hold pt until Dec ov.  Verified that pt is in the system as a recall for Dec 2013.  Mr Steffy also asking when next CT is to be scheduled.  Advised per pt instructions from 6.17.13, RB will discuss potential for repeat CT at the Dec 2013 ov.  Rx sent.  Will sign and forward to RB to make him aware.

## 2012-07-26 ENCOUNTER — Other Ambulatory Visit: Payer: Self-pay | Admitting: *Deleted

## 2012-07-26 DIAGNOSIS — J479 Bronchiectasis, uncomplicated: Secondary | ICD-10-CM

## 2012-07-26 MED ORDER — ETHAMBUTOL HCL 400 MG PO TABS: 400.0000 mg | ORAL_TABLET | Freq: Three times a day (TID) | ORAL | Status: DC

## 2012-07-27 ENCOUNTER — Other Ambulatory Visit: Payer: Self-pay | Admitting: Emergency Medicine

## 2012-07-27 MED ORDER — LORATADINE 10 MG PO TABS: 10.0000 mg | ORAL_TABLET | Freq: Every evening | ORAL | Status: DC

## 2012-09-04 ENCOUNTER — Ambulatory Visit (INDEPENDENT_AMBULATORY_CARE_PROVIDER_SITE_OTHER): Payer: Medicare Other | Admitting: Internal Medicine

## 2012-09-04 ENCOUNTER — Encounter: Payer: Self-pay | Admitting: Internal Medicine

## 2012-09-04 ENCOUNTER — Telehealth: Payer: Self-pay | Admitting: Emergency Medicine

## 2012-09-04 VITALS — BP 142/62 | HR 53 | Temp 98.2°F | Ht 62.0 in | Wt 110.0 lb

## 2012-09-04 DIAGNOSIS — J479 Bronchiectasis, uncomplicated: Secondary | ICD-10-CM

## 2012-09-04 MED ORDER — PREDNISONE 10 MG PO TABS: ORAL_TABLET | ORAL | Status: DC

## 2012-09-04 NOTE — Patient Instructions (Addendum)
Unclear what is going on but possibly acute bronchitis I spoke to Dr Delton Coombes and we agreed to give you a short trial prednisone to see if there is some bronchitis that needs clearing up Take prednisone 40 mg daily x 2 days, then 20mg  daily x 2 days, then 10mg  daily x 2 days, then 5mg  daily x 2 days and stop Followup with Dr Delton Coombes as previously scheduled or call or come sooner if worse

## 2012-09-04 NOTE — Progress Notes (Signed)
Subjective:    Patient ID: Audrey Obrien, female    DOB: Apr 12, 1931, 78 y.o.   MRN: [id number]  HPI 78 yo never smoker w hx scleroderma, HTN, GERD, chronic cough (2-3 yrs). 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 I saw her in the hospital 9/27 for cough and hemoptysis. CT scan of the chest 9/27 showed nodular parenchymal opacity within the RUL with a  'tree in bud ' appearance, progressed compared with 06/2011. FOB done 9/28 showed old blood in the RUL posterior segment, BAL has been smear negative for AFB and fungal (final cx pending). The bacterial cx grew sensitive pseudomonas. Cytology on BAL negative. She has nasal gtt and a lot of throat mucous. Has tried nasonex before, not sure it helped. Not currently on anything. Started mucinex a week ago.   ROV 10/12/11 -- scleroderma, chronic cough, bronchiectasis. BAL done 9/28 = [facility]IC. Started on ethambutol, rifampin, clarithromycin on 09/22/11. She has tolerated the antibiotics, has had a single episode diarrhea, has noticed a rash on her legs. Remains on the loratadine and fluticasone.  She believes that her mucous production is improved.   ROV 10/21/11 - scleroderma, chronic cough, bronchiectasis. Dx with MAIC on BAL. Started 3 drug rx but she developed rash, had to stop all 3 meds. Returns to discuss next plans. She says the rash is better, has low appetite.   ROV 11/22/11 -- scleroderma, chronic cough, bronchiectasis. Dx with MAIC on BAL. Attempted to start clarithro alone (due to rash on 3 drug rx), but we had to stop it due to lack of appetite, nausea. Stopped it 3 days ago.  ROV 05/22/12 -- scleroderma, chronic cough, bronchiectasis. Dx with MAIC on BAL 09/03/11. Was unable to tolerate 3 drug therapy as above. Repeaqt CT scan 6/12 shows little change, maybe some possible improvement in tree-bud pattern, a more prominent L sided nodule, likely inflammatory.  She restarted clarithro in  January, then added ethambutol 2 weeks later - but no longer taking. Has been on the clarithro for 5 months.  Her cough is dry, more rare than last visit.    We will continue clarithromycin for another 6 months.  Add back ethambutol 400mg  Three times a day for the next 6 months. Call our office if the medication gives you trouble.  Continue your claritin daily  Use over-the-counter chlorpheniramine or brompheniramine up to every 6 hours to help with your congestion.  Follow with Dr Delton Coombes in 6 months or sooner if you have any problems  We will discuss the timing of a potential repeat CT scan in 6 months.    OV 09/04/2012  On chfonic antibiotics - cephalexin for UTI prophylaxis, Biaxn and Ethambutol for [facility]. Baseline wheelchair and walks with assist due to left corneal transplant and deconditoning April 2013.   Acute visit for this Dr Delton Coombes patient. Husband gives all history. complicatied past medical hx. Sounds like past week increase runny nose, cough but with white sputum. No fever and no change in dyspnea. CXR at [facility] a few days showed persistence of old RUL MAC infiltrates without new findings (in my opinion). This morning patient more symptomatic with more runny nose and some dyspnea. So, husband concerned. So acute vsiit    Review of Systems  Constitutional: Negative for fever and unexpected weight change.  HENT: Negative for ear pain, nosebleeds, congestion, sore throat, rhinorrhea, sneezing, trouble swallowing, dental problem, postnasal drip and sinus pressure.   Eyes:  Negative for redness and itching.  Respiratory: Positive for cough, chest tightness and wheezing. Negative for shortness of breath.   Cardiovascular: Negative for palpitations and leg swelling.  Gastrointestinal: Negative for nausea and vomiting.  Genitourinary: Negative for dysuria.  Musculoskeletal: Negative for joint swelling.  Skin: Negative for rash.  Neurological: Negative for headaches.  Hematological:  Does not bruise/bleed easily.  Psychiatric/Behavioral: Negative for dysphoric mood. The patient is not nervous/anxious.    Current outpatient prescriptions:aspirin 81 MG tablet, Take 81 mg by mouth at bedtime. , Disp: , Rfl: ;  Cephalexin 250 MG tablet, Take 250 mg by mouth daily., Disp: , Rfl: ;  Cholecalciferol (VITAMIN D) 1000 UNITS capsule, Take 1,000 Units by mouth daily.  , Disp: , Rfl: ;  clarithromycin (BIAXIN) 500 MG tablet, Take 1 tablet (500 mg total) by mouth 2 (two) times daily., Disp: 180 tablet, Rfl: 0 esomeprazole (NEXIUM) 40 MG capsule, Take 40 mg by mouth daily before breakfast., Disp: , Rfl: ;  ethambutol (MYAMBUTOL) 400 MG tablet, Take 1 tablet (400 mg total) by mouth 3 (three) times daily., Disp: 270 tablet, Rfl: 3;  guaiFENesin (MUCINEX) 600 MG 12 hr tablet, Take 1,200 mg by mouth 2 (two) times daily., Disp: , Rfl: ;  guaiFENesin (ROBITUSSIN) 100 MG/5ML liquid, Take 200 mg by mouth 3 (three) times daily as needed., Disp: , Rfl:  loratadine (CLARITIN) 10 MG tablet, Take 1 tablet (10 mg total) by mouth every evening., Disp: 90 tablet, Rfl: 0;  Methenamine-Sodium Salicylate (CYSTEX PO), Take 1 tablet by mouth 3 (three) times daily., Disp: , Rfl: ;  METRONIDAZOLE, TOPICAL, (METROLOTION) 0.75 % LOTN, Apply 1 application topically 2 (two) times daily as needed. For rosecea, Disp: , Rfl:  phenytoin (DILANTIN) 100 MG ER capsule, Take 1 capsule (100 mg total) by mouth 3 (three) times daily. Take 1 tablet in the am and 2 tablets in the pm., Disp: 90 capsule, Rfl: 0;  prednisoLONE acetate (PRED FORTE) 1 % ophthalmic suspension, Place 1 drop into the left eye 4 (four) times daily., Disp: , Rfl: ;  primidone (MYSOLINE) 50 MG tablet, Take 50-100 mg by mouth 2 (two) times daily. 2 tabs in the morning and 1 tab at night, Disp: , Rfl:  propranolol (INDERAL LA) 80 MG 24 hr capsule, Take 80 mg by mouth 2 (two) times daily. , Disp: , Rfl: ;  DISCONTD: phenytoin (DILANTIN) 200 MG ER capsule, Take 1 capsule  (200 mg total) by mouth at bedtime., Disp: 30 capsule, Rfl: 1     Objective:   Physical Exam  Vitals reviewed. Constitutional: She is oriented to person, place, and time. She appears well-developed and well-nourished. No distress.       Body mass index is 20.12 kg/(m^2). Sitting in wheel chair  HENT:  Head: Normocephalic and atraumatic.  Right Ear: External ear normal.  Left Ear: External ear normal.  Mouth/Throat: Oropharynx is clear and moist. No oropharyngeal exudate.       Left eye corneal transplant  Eyes: Conjunctivae normal and EOM are normal. Pupils are equal, round, and reactive to light. Right eye exhibits no discharge. Left eye exhibits no discharge. No scleral icterus.  Neck: Normal range of motion. Neck supple. No JVD present. No tracheal deviation present. No thyromegaly present.  Cardiovascular: Normal rate, regular rhythm, normal heart sounds and intact distal pulses.  Exam reveals no gallop and no friction rub.   No murmur heard. Pulmonary/Chest: Effort normal and breath sounds normal. No respiratory distress. She has no wheezes.  She has no rales. She exhibits no tenderness.  Abdominal: Soft. Bowel sounds are normal. She exhibits no distension and no mass. There is no tenderness. There is no rebound and no guarding.  Musculoskeletal: Normal range of motion. She exhibits no edema and no tenderness.  Lymphadenopathy:    She has no cervical adenopathy.  Neurological: She is alert and oriented to person, place, and time. She has normal reflexes. No cranial nerve deficit. She exhibits normal muscle tone. Coordination normal.       Normal speech but husband giving most of hx  Skin: Skin is warm and dry. No rash noted. She is not diaphoretic. No erythema. No pallor.  Psychiatric: She has a normal mood and affect. Her behavior is normal. Judgment and thought content normal.       Flat affect      Assessment & Plan:

## 2012-09-04 NOTE — Telephone Encounter (Signed)
I spoke with spouse and he stated pt is very congested and is coughing a lot. He requested an appt for today. Pt is scheduled to come in and see MR at 3:00 today. Also spouse stated pt had CXR done at [facility] last week and was told it showed some abnormalities and that they would forward report to RB. Pacs system can pull up [facility] x[facility]. Please advise RB thanks

## 2012-09-04 NOTE — Telephone Encounter (Signed)
Agree with OV today to assess sx and to review films. Appreciate MR's help

## 2012-09-05 NOTE — Assessment & Plan Note (Signed)
Unclear what is going on but possibly acute bronchitis I spoke to Dr Delton Coombes and we agreed to give you a short trial prednisone to see if there is some bronchitis that needs clearing up Take prednisone 40 mg daily x 2 days, then 20mg  daily x 2 days, then 10mg  daily x 2 days, then 5mg  daily x 2 days and stop Will hold off of any additional antibiotics Followup with Dr Delton Coombes as previously scheduled or call or come sooner if worse

## 2012-09-14 ENCOUNTER — Telehealth: Payer: Self-pay | Admitting: Internal Medicine

## 2012-09-14 MED ORDER — FLUTICASONE PROPIONATE 50 MCG/ACT NA SUSP: 2.0000 | Freq: Two times a day (BID) | NASAL | Status: DC

## 2012-09-14 NOTE — Telephone Encounter (Signed)
I spoke with Audrey Obrien and is aware of this. He stated he wanted to make sure to remind RB that pt does have extremely dry eyes so he is careful with what antihistamines pt is on, She takes generic Claritin. Please advise RB thanks

## 2012-09-14 NOTE — Telephone Encounter (Signed)
Pt recently seen by MR and was given a Prednisone taper which she finished on Sun., 09/10/12.  Pt's spouse states the pt's cough has not improved and may be slightly worse. She also has increased mucus prod but it is clear. Wheezing tends to be worse as well. I will forward to MR since he saw the pt lat and get recs. Pls advise.No Known Allergies

## 2012-09-14 NOTE — Telephone Encounter (Signed)
Dw Dr Delton Coombes   He knows her better than I. He will review my OV and current complaint and decide next stept that might involve patient coming into seeing him.  MR Note being sent to dR Byryum with cc to triage who will inform patient of above plan

## 2012-09-14 NOTE — Telephone Encounter (Signed)
Would recommend fluticasone nasal spray, 2 sprays each nostril bid. If this dries her out too much then decrease to 1 spray each nostril bid. She should also start Nasal saline washes every day if she can tolerate.  She could also try adding back over-the-counter chlorpheniramine or brompheniramine up to every 6 hours prn, but she has had trouble tolerating some meds due to dryness.  If these things do not decrease her nasal drainage and cough then she will need to be seen

## 2012-09-14 NOTE — Telephone Encounter (Signed)
Spoke with [place], pt's spouse and notified of recs per RB.  He verbalized understanding and states no further needs

## 2012-10-05 ENCOUNTER — Other Ambulatory Visit: Payer: Self-pay | Admitting: Emergency Medicine

## 2012-10-07 ENCOUNTER — Other Ambulatory Visit: Payer: Self-pay | Admitting: Emergency Medicine

## 2012-10-25 ENCOUNTER — Telehealth: Payer: Self-pay | Admitting: Emergency Medicine

## 2012-10-25 DIAGNOSIS — R918 Other nonspecific abnormal finding of lung field: Secondary | ICD-10-CM

## 2012-10-25 NOTE — Telephone Encounter (Signed)
ATC Billy at number provided.  Messages received that "[employer] cable customer has not set up their voicemail."  [facility]

## 2012-10-26 NOTE — Telephone Encounter (Signed)
Will order repeat Ct scan now

## 2012-10-26 NOTE — Telephone Encounter (Signed)
Spoke with pts spouse, wants to know if you are wanting to do a CT scan prior to patient Jan 6 f/u appt.   Dr Delton Coombes please advise. Thanks.

## 2012-10-27 NOTE — Telephone Encounter (Signed)
Pt spouse is aware. Faustina Gebert, CMA  

## 2012-11-08 ENCOUNTER — Ambulatory Visit (INDEPENDENT_AMBULATORY_CARE_PROVIDER_SITE_OTHER)
Admission: RE | Admit: 2012-11-08 | Discharge: 2012-11-08 | Disposition: A | Discharge status: Home / Self Care | Payer: Medicare Other | Source: Ambulatory Visit | Attending: Emergency Medicine | Admitting: Emergency Medicine

## 2012-11-08 DIAGNOSIS — R918 Other nonspecific abnormal finding of lung field: Secondary | ICD-10-CM

## 2012-12-11 ENCOUNTER — Ambulatory Visit: Payer: Medicare Other | Admitting: Emergency Medicine

## 2012-12-27 ENCOUNTER — Ambulatory Visit (INDEPENDENT_AMBULATORY_CARE_PROVIDER_SITE_OTHER): Payer: Medicare Other | Admitting: Emergency Medicine

## 2012-12-27 ENCOUNTER — Encounter: Payer: Self-pay | Admitting: Emergency Medicine

## 2012-12-27 VITALS — BP 140/70 | HR 56 | Temp 97.4°F | Ht 62.0 in | Wt 89.0 lb

## 2012-12-27 DIAGNOSIS — J479 Bronchiectasis, uncomplicated: Secondary | ICD-10-CM

## 2012-12-27 MED ORDER — ALBUTEROL SULFATE HFA 108 (90 BASE) MCG/ACT IN AERS: 2.0000 | INHALATION_SPRAY | RESPIRATORY_TRACT | Status: DC | PRN

## 2012-12-27 MED ORDER — SPACER/AERO CHAMBER MOUTHPIECE MISC: Status: DC

## 2012-12-27 NOTE — Patient Instructions (Addendum)
Please stop your clarithromycin and ethambutol We will try using albuterol 2 puffs through a spacer as needed for shortness of breath Follow with Dr Delton Coombes in 6 months or sooner if you have any problems

## 2012-12-27 NOTE — Progress Notes (Signed)
Subjective:    Patient ID: [facility], female    DOB: 1931-06-06, 79 y.o.   MRN: [id number]  HPI 79 yo never smoker w hx scleroderma, HTN, GERD, chronic cough (2-3 yrs). 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 I saw her in the hospital 9/27 for cough and hemoptysis. CT scan of the chest 9/27 showed nodular parenchymal opacity within the RUL with a  'tree in bud ' appearance, progressed compared with 06/2011. FOB done 9/28 showed old blood in the RUL posterior segment, BAL has been smear negative for AFB and fungal (final cx pending). The bacterial cx grew sensitive pseudomonas. Cytology on BAL negative. She has nasal gtt and a lot of throat mucous. Has tried nasonex before, not sure it helped. Not currently on anything. Started mucinex a week ago.   ROV 10/12/11 -- scleroderma, chronic cough, bronchiectasis. BAL done 9/28 = [facility]. Started on ethambutol, rifampin, clarithromycin on 09/22/11. She has tolerated the antibiotics, has had a single episode diarrhea, has noticed a rash on her legs. Remains on the loratadine and fluticasone.  She believes that her mucous production is improved.   ROV 10/21/11 - scleroderma, chronic cough, bronchiectasis. Dx with [facility] on BAL. Started 3 drug rx but she developed rash, had to stop all 3 meds. Returns to discuss next plans. She says the rash is better, has low appetite.   ROV 11/22/11 -- scleroderma, chronic cough, bronchiectasis. Dx with [facility] on BAL. Attempted to start clarithro alone (due to rash on 3 drug rx), but we had to stop it due to lack of appetite, nausea. Stopped it 3 days ago.  ROV 05/22/12 -- scleroderma, chronic cough, bronchiectasis. Dx with [facility] on BAL 09/03/11. Was unable to tolerate 3 drug therapy as above. Repeaqt CT scan 6/12 shows little change, maybe some possible improvement in tree-bud pattern, a more prominent L sided nodule, likely inflammatory.  She restarted clarithro in  January, then added ethambutol 2 weeks later - but no longer taking. Has been on the clarithro for 5 months.  Her cough is dry, more rare than last visit.   OV 09/04/2012 Acute OV for cough, mucus, sounded like PND, upper airway cough  ROV 12/27/12 -- scleroderma, chronic cough, PND, bronchiectasis. Has been on clarithro + ethambutol x 1 year for [facility]. Repeat CT scan 12/08/12 -- some mild progression of micronodular disease as below. She reports that since last time she has lost her sight, her functional capacity has significantly declined  - dementia, scleroderma, dysphagia, blindness. She has some occasional cough, less congestion.     Objective:   Physical Exam Filed Vitals:   12/27/12 1529  BP: 140/70  Pulse: 56  Temp: 97.4 F (36.3 C)    Gen: Pleasant, ill appearing in wheelchair, in no distress,  normal affect  ENT: No lesions,  mouth clear,  oropharynx clear, gravel voice quality, some postnasal drip  Neck: No JVD, no TMG, no carotid bruits  Lungs: No use of accessory muscles, no dullness to percussion, clear without rales or rhonchi  Cardiovascular: RRR, heart sounds normal, no murmur or gallops, no peripheral edema  Musculoskeletal: No deformities, no cyanosis or clubbing  Neuro: alert, non focal, slightly slow to respond, mild tremor.   Skin: Warm, no lesions or rashes   CT scan 12/08/12 --  Comparison: 05/03/2012, 09/02/2011 and 07/06/2011.  Findings: Mediastinal lymph nodes measure up to 1.5 cm in the  precarinal station, stable. Hilar regions  are difficult to  definitively evaluate without IV contrast. No axillary adenopathy.  Pulmonary arteries are borderline enlarged. Coronary artery  calcification. Heart is at the upper limits of normal in size.  Small amount of pericardial fluid is likely physiologic.  Biapical pleural parenchymal scarring. Peribronchovascular  nodularity and scattered air space consolidation have progressed,  especially in the right middle and  right lower lobes. Minimal  associated bronchiectasis. Probable mucoid impaction in the  anterior left upper lobe (image 24), unchanged. No pleural fluid.  Airway is unremarkable.  Incidental imaging of the upper abdomen shows no acute findings.  No worrisome lytic or sclerotic lesions.  IMPRESSION:  1. Progressive peribronchovascular nodularity and airspace  consolidation in the right lung, favoring an ongoing  atypical/mycobacterial infectious process.  2. Mediastinal lymph nodes may be reactive and are stable.      Assessment & Plan:  Bronchiectasis without acute exacerbation Her CT scan shows slightly more nodularity, but risk benefit at this point favors stopping the abx. [facility]rey [facility] been treated for 1 yr, is now transitioning to hospice with several other superimposed problems. She can use albuterol with a spacer as needed

## 2012-12-27 NOTE — Assessment & Plan Note (Signed)
Her CT scan shows slightly more nodularity, but risk benefit at this point favors stopping the abx. [facility] been treated for 1 yr, is now transitioning to hospice with several other superimposed problems. She can use albuterol with a spacer as needed

## 2013-01-25 IMAGING — CT CT CHEST W/O CM
2 of 4 series · 15 of 36 positions shown, 18 images · IV contrast (Omnipaque 300)
Comparison: 05/03/2012, 09/02/2011 and 07/06/2011.

CLINICAL DATA: Follow-up pulmonary nodules.  Chronic productive
cough.

CT CHEST WITHOUT CONTRAST
TECHNIQUE: Multidetector CT imaging of the chest was performed
following the standard protocol without IV contrast.

[Series 2: chest routine with · axial · 0.56mm/px · z∈[-290,-30]mm · 12 of 62 slices shown, 15 images]
[im 5/62  mediastinal]
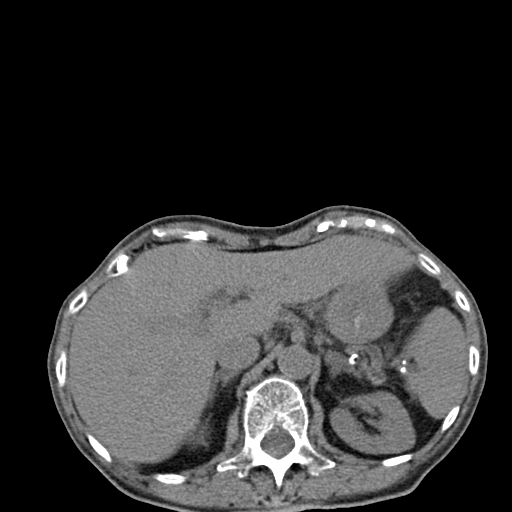
[im 5/62  lung]
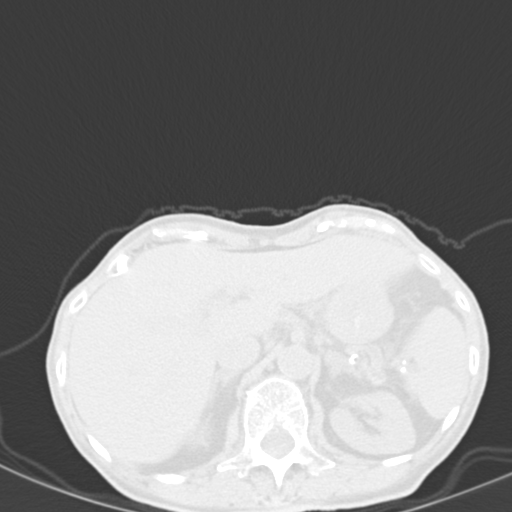
[im 10/62  lung]
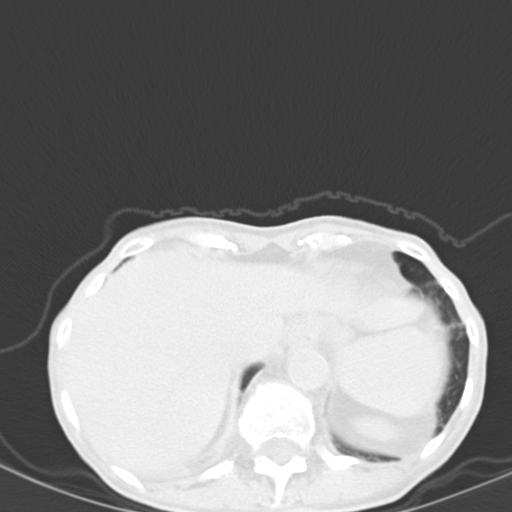
[im 15/62  lung]
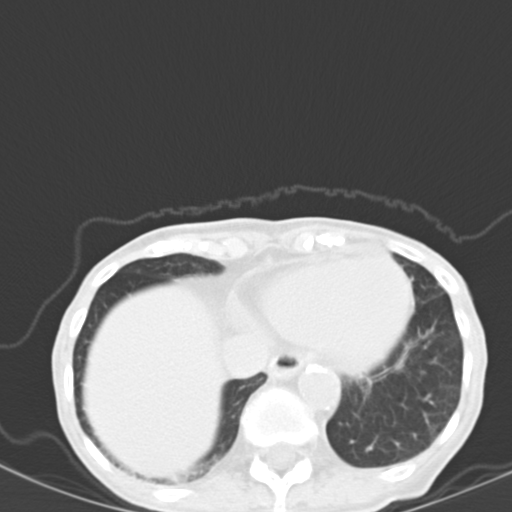
[im 19/62  lung]
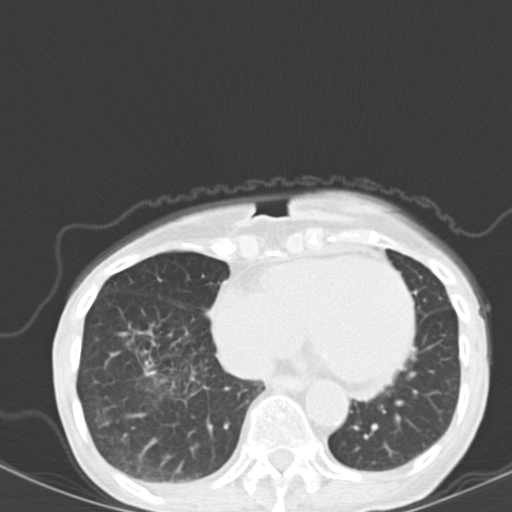
[im 24/62  mediastinal]
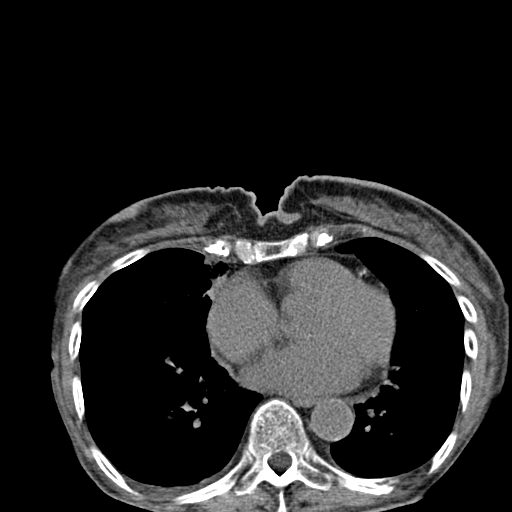
[im 24/62  lung]
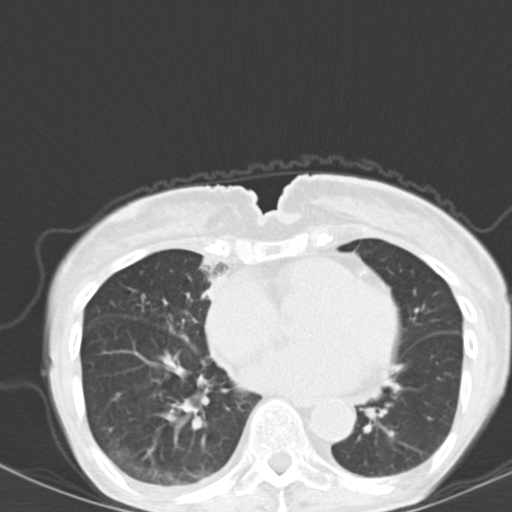
[im 29/62  lung]
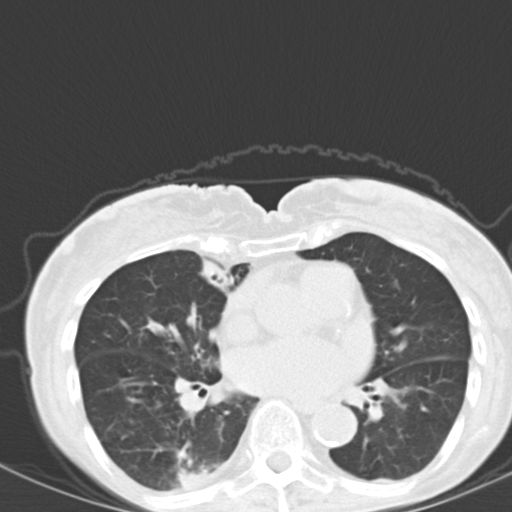
[im 33/62  lung]
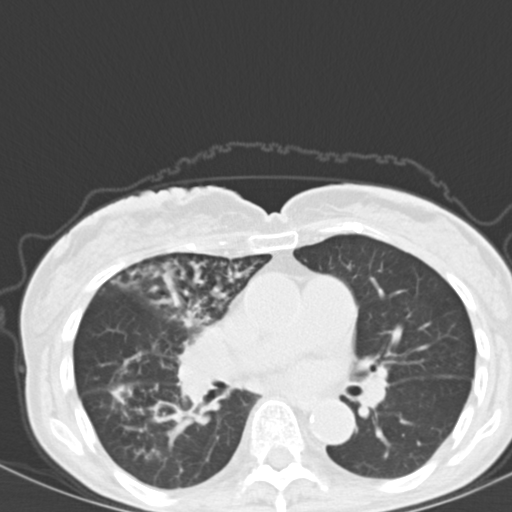
[im 38/62  lung]
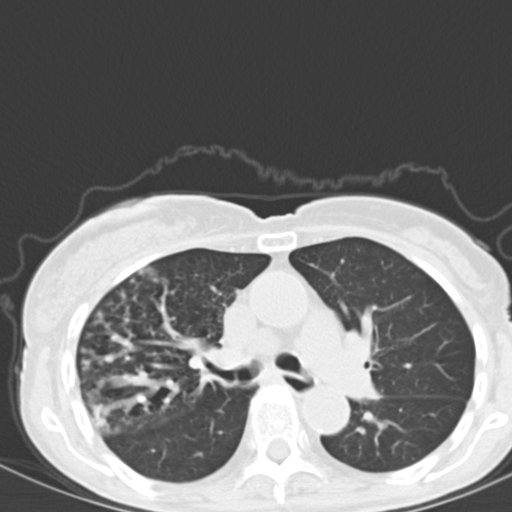
[im 43/62  mediastinal]
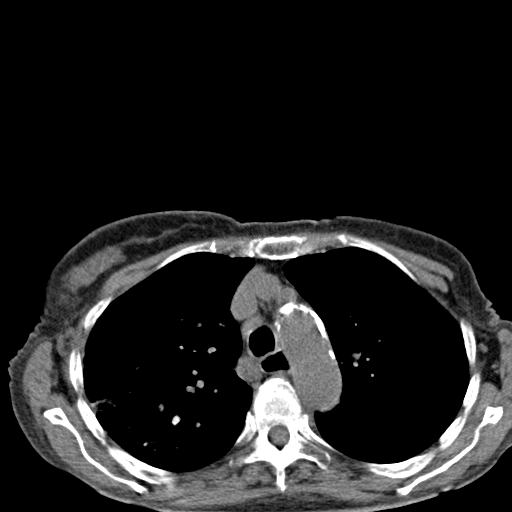
[im 43/62  lung]
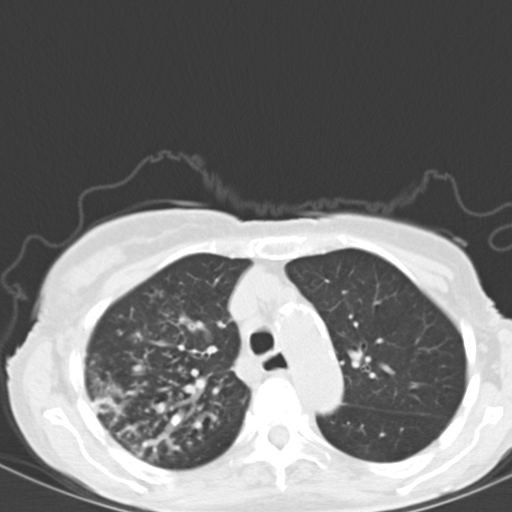
[im 47/62  lung]
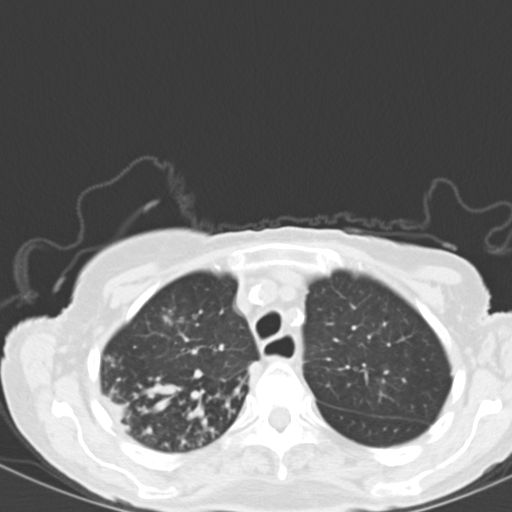
[im 52/62  lung]
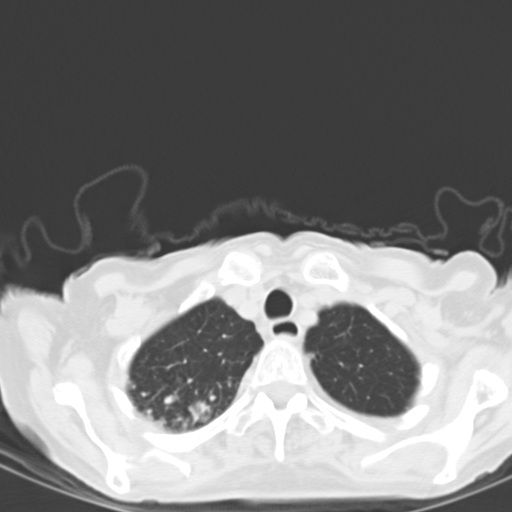
[im 57/62  lung]
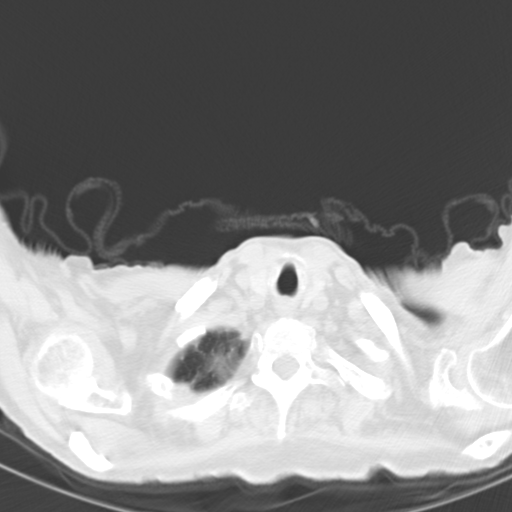

[Series 602: cor · coronal · 0.60mm/px · 3 of 79 slices shown]
[im 16/79  lung]
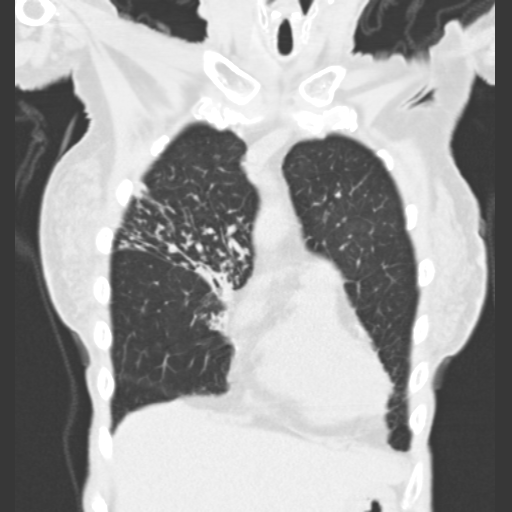
[im 32/79  lung]
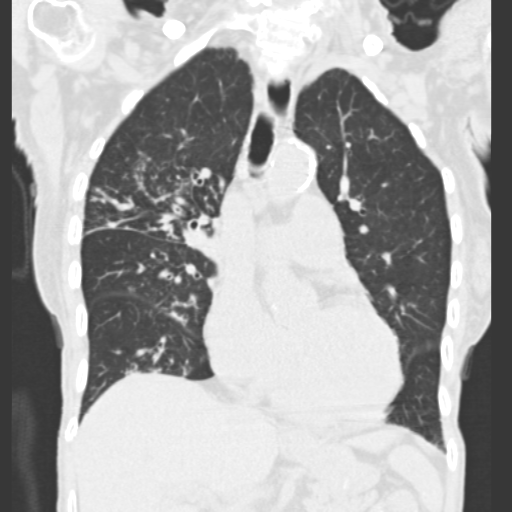
[im 47/79  lung]
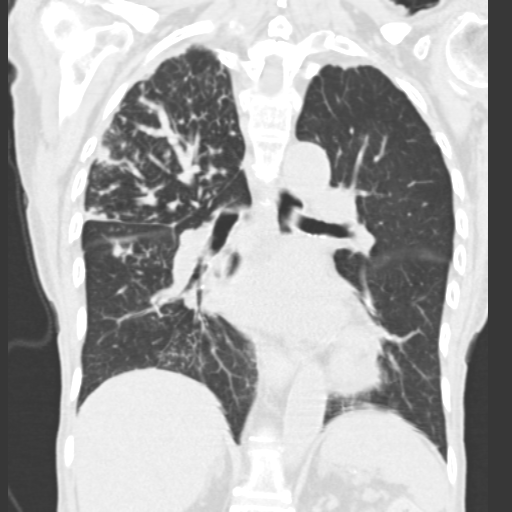

[15 of 36 positions shown; findings below may reference images not displayed]

FINDINGS: Mediastinal lymph nodes measure up to 1.5 cm in the
precarinal station, stable.  Hilar regions are difficult to
definitively evaluate without IV contrast.  No axillary adenopathy.
Pulmonary arteries are borderline enlarged.  Coronary artery
calcification.  Heart is at the upper limits of normal in size.
Small amount of pericardial fluid is likely physiologic.

Biapical pleural parenchymal scarring.  Peribronchovascular
nodularity and scattered air space consolidation have progressed,
especially in the right middle and right lower lobes.  Minimal
associated bronchiectasis.  Probable mucoid impaction in the
anterior left upper lobe (image 24), unchanged.  No pleural fluid.
Airway is unremarkable.

Incidental imaging of the upper abdomen shows no acute findings.
No worrisome lytic or sclerotic lesions.
IMPRESSION: 1.  Progressive peribronchovascular nodularity and airspace
consolidation in the right lung, favoring an ongoing
atypical/mycobacterial infectious process.
2.  Mediastinal lymph nodes may be reactive and are stable.

## 2013-04-09 ENCOUNTER — Ambulatory Visit: Payer: Medicare Other | Admitting: Occupational Therapy

## 2013-04-09 ENCOUNTER — Ambulatory Visit: Payer: Medicare Other | Admitting: Physical Therapy

## 2013-04-16 ENCOUNTER — Ambulatory Visit: Payer: Medicare Other | Attending: Internal Medicine | Admitting: Physical Therapy

## 2013-04-16 DIAGNOSIS — IMO0001 Reserved for inherently not codable concepts without codable children: Secondary | ICD-10-CM | POA: Insufficient documentation

## 2013-04-16 DIAGNOSIS — R262 Difficulty in walking, not elsewhere classified: Secondary | ICD-10-CM | POA: Insufficient documentation

## 2013-04-16 DIAGNOSIS — H543 Unqualified visual loss, both eyes: Secondary | ICD-10-CM | POA: Insufficient documentation

## 2013-04-19 ENCOUNTER — Other Ambulatory Visit: Payer: Self-pay

## 2013-04-19 DIAGNOSIS — Z1231 Encounter for screening mammogram for malignant neoplasm of breast: Secondary | ICD-10-CM

## 2013-04-20 ENCOUNTER — Ambulatory Visit: Payer: Medicare Other | Admitting: Physical Therapy

## 2013-05-01 ENCOUNTER — Ambulatory Visit: Payer: Medicare Other | Admitting: Physical Therapy

## 2013-05-01 ENCOUNTER — Encounter: Payer: Medicare Other | Admitting: Occupational Therapy

## 2013-05-02 ENCOUNTER — Ambulatory Visit: Payer: Medicare Other | Admitting: Occupational Therapy

## 2013-05-02 ENCOUNTER — Ambulatory Visit: Payer: Medicare Other | Admitting: Physical Therapy

## 2013-05-04 ENCOUNTER — Ambulatory Visit: Payer: Medicare Other | Admitting: Physical Therapy

## 2013-05-14 ENCOUNTER — Ambulatory Visit: Payer: Medicare Other

## 2013-05-14 ENCOUNTER — Ambulatory Visit: Payer: Medicare Other | Attending: Internal Medicine | Admitting: Occupational Therapy

## 2013-05-14 DIAGNOSIS — IMO0001 Reserved for inherently not codable concepts without codable children: Secondary | ICD-10-CM | POA: Insufficient documentation

## 2013-05-14 DIAGNOSIS — R262 Difficulty in walking, not elsewhere classified: Secondary | ICD-10-CM | POA: Insufficient documentation

## 2013-05-14 DIAGNOSIS — H543 Unqualified visual loss, both eyes: Secondary | ICD-10-CM | POA: Insufficient documentation

## 2013-05-16 ENCOUNTER — Telehealth: Payer: Self-pay | Admitting: Emergency Medicine

## 2013-05-16 NOTE — Telephone Encounter (Signed)
Spoke with patient made him aware of recs as listed below per RB Patient has been scheduled for f/u 06/20/13 @330pm  Nothing further at this time

## 2013-05-16 NOTE — Telephone Encounter (Signed)
Based on our last visit, I think we can hold off on the CT scan until after I see her and we discuss how she is doing.

## 2013-05-16 NOTE — Telephone Encounter (Signed)
Dr. Delton Coombes does pt CT need to be with or without contrast? Please advise thanks

## 2013-05-17 ENCOUNTER — Ambulatory Visit: Payer: Medicare Other | Admitting: Occupational Therapy

## 2013-05-17 ENCOUNTER — Ambulatory Visit: Payer: Medicare Other | Admitting: Physical Therapy

## 2013-05-21 ENCOUNTER — Ambulatory Visit: Payer: Medicare Other | Admitting: Physical Therapy

## 2013-05-21 ENCOUNTER — Ambulatory Visit: Payer: Medicare Other | Admitting: Occupational Therapy

## 2013-05-23 ENCOUNTER — Ambulatory Visit
Admission: RE | Admit: 2013-05-23 | Discharge: 2013-05-23 | Disposition: A | Discharge status: Home / Self Care | Payer: Medicare Other | Source: Ambulatory Visit

## 2013-05-23 DIAGNOSIS — Z1231 Encounter for screening mammogram for malignant neoplasm of breast: Secondary | ICD-10-CM

## 2013-05-24 ENCOUNTER — Ambulatory Visit: Payer: Medicare Other | Admitting: Occupational Therapy

## 2013-05-24 ENCOUNTER — Ambulatory Visit: Payer: Medicare Other | Admitting: Physical Therapy

## 2013-05-25 ENCOUNTER — Encounter: Payer: Self-pay | Admitting: Neurology

## 2013-05-28 ENCOUNTER — Ambulatory Visit: Payer: Medicare Other | Admitting: Physical Therapy

## 2013-05-31 ENCOUNTER — Ambulatory Visit: Payer: Medicare Other | Admitting: Physical Therapy

## 2013-06-04 ENCOUNTER — Ambulatory Visit: Payer: Medicare Other | Admitting: Physical Therapy

## 2013-06-04 ENCOUNTER — Ambulatory Visit: Payer: Medicare Other | Admitting: Occupational Therapy

## 2013-06-05 ENCOUNTER — Ambulatory Visit: Payer: Medicare Other | Attending: Internal Medicine | Admitting: Physical Therapy

## 2013-06-05 DIAGNOSIS — R262 Difficulty in walking, not elsewhere classified: Secondary | ICD-10-CM | POA: Insufficient documentation

## 2013-06-05 DIAGNOSIS — IMO0001 Reserved for inherently not codable concepts without codable children: Secondary | ICD-10-CM | POA: Insufficient documentation

## 2013-06-05 DIAGNOSIS — H543 Unqualified visual loss, both eyes: Secondary | ICD-10-CM | POA: Insufficient documentation

## 2013-06-07 ENCOUNTER — Ambulatory Visit: Payer: Medicare Other | Admitting: Physical Therapy

## 2013-06-07 ENCOUNTER — Ambulatory Visit: Payer: Medicare Other | Admitting: Occupational Therapy

## 2013-06-11 ENCOUNTER — Ambulatory Visit: Payer: Medicare Other | Admitting: Physical Therapy

## 2013-06-11 ENCOUNTER — Ambulatory Visit: Payer: Medicare Other | Admitting: Occupational Therapy

## 2013-06-12 ENCOUNTER — Ambulatory Visit: Payer: Medicare Other | Admitting: Physical Therapy

## 2013-06-14 ENCOUNTER — Ambulatory Visit: Payer: Medicare Other | Admitting: Occupational Therapy

## 2013-06-14 ENCOUNTER — Ambulatory Visit: Payer: Medicare Other | Admitting: Physical Therapy

## 2013-06-18 ENCOUNTER — Ambulatory Visit: Payer: Medicare Other | Admitting: Physical Therapy

## 2013-06-18 ENCOUNTER — Ambulatory Visit: Payer: Medicare Other | Admitting: Occupational Therapy

## 2013-06-20 ENCOUNTER — Ambulatory Visit (INDEPENDENT_AMBULATORY_CARE_PROVIDER_SITE_OTHER): Payer: Medicare Other | Admitting: Emergency Medicine

## 2013-06-20 ENCOUNTER — Encounter: Payer: Self-pay | Admitting: Emergency Medicine

## 2013-06-20 VITALS — BP 140/72 | HR 75 | Temp 98.9°F | Ht 62.0 in | Wt 102.8 lb

## 2013-06-20 DIAGNOSIS — J479 Bronchiectasis, uncomplicated: Secondary | ICD-10-CM

## 2013-06-20 NOTE — Assessment & Plan Note (Signed)
Stable. No changes in current regimen.

## 2013-06-20 NOTE — Progress Notes (Signed)
Subjective:    Patient ID: Audrey Obrien, female    DOB: 10/21/1931, 79 y.o.   MRN: [id number]  HPI 79 yo never smoker w hx scleroderma, HTN, GERD, chronic cough (2-3 yrs). 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 I saw her in the hospital 9/27 for cough and hemoptysis. CT scan of the chest 9/27 showed nodular parenchymal opacity within the RUL with a  'tree in bud ' appearance, progressed compared with 06/2011. FOB done 9/28 showed old blood in the RUL posterior segment, BAL has been smear negative for AFB and fungal (final cx pending). The bacterial cx grew sensitive pseudomonas. Cytology on BAL negative. She has nasal gtt and a lot of throat mucous. Has tried nasonex before, not sure it helped. Not currently on anything. Started mucinex a week ago.   ROV 10/12/11 -- scleroderma, chronic cough, bronchiectasis. BAL done 9/28 = [facility]. Started on ethambutol, rifampin, clarithromycin on 09/22/11. She has tolerated the antibiotics, has had a single episode diarrhea, has noticed a rash on her legs. Remains on the loratadine and fluticasone.  She believes that her mucous production is improved.   ROV 10/21/11 - scleroderma, chronic cough, bronchiectasis. Dx with [facility] on BAL. Started 3 drug rx but she developed rash, had to stop all 3 meds. Returns to discuss next plans. She says the rash is better, has low appetite.   ROV 11/22/11 -- scleroderma, chronic cough, bronchiectasis. Dx with [facility] on BAL. Attempted to start clarithro alone (due to rash on 3 drug rx), but we had to stop it due to lack of appetite, nausea. Stopped it 3 days ago.  ROV 05/22/12 -- scleroderma, chronic cough, bronchiectasis. Dx with [facility] on BAL 09/03/11. Was unable to tolerate 3 drug therapy as above. Repeaqt CT scan 6/12 shows little change, maybe some possible improvement in tree-bud pattern, a more prominent L sided nodule, likely inflammatory.  She restarted clarithro in  January, then added ethambutol 2 weeks later - but no longer taking. Has been on the clarithro for 5 months.  Her cough is dry, more rare than last visit.   OV 09/04/2012 Acute OV for cough, mucus, sounded like PND, upper airway cough  ROV 12/27/12 -- scleroderma, chronic cough, PND, bronchiectasis. Has been on clarithro + ethambutol x 1 year for [facility]. Repeat CT scan 12/08/12 -- some mild progression of micronodular disease as below. She reports that since last time she has lost her sight, her functional capacity has significantly declined  - dementia, scleroderma, dysphagia, blindness. She has some occasional cough, less congestion.   ROV 06/21/13 -- scleroderma, chronic cough, PND, bronchiectasis. We are no longer on abx for [facility]. She is doing well. Cough is better on chlorpheniramine bid.  Uses albuterol prn - rarely.     Objective:   Physical Exam Filed Vitals:   06/20/13 1529  BP: 140/72  Pulse: 75  Temp: 98.9 F (37.2 C)    Gen: Pleasant, ill appearing in wheelchair, in no distress, some tremor, ? parkinsonian affect  ENT: No lesions,  mouth clear,  oropharynx clear, gravel voice quality, some postnasal drip  Neck: No JVD, no TMG, no carotid bruits  Lungs: No use of accessory muscles, no dullness to percussion, clear without rales or rhonchi  Cardiovascular: RRR, heart sounds normal, no murmur or gallops, no peripheral edema  Musculoskeletal: No deformities, no cyanosis or clubbing  Neuro: alert, non focal, slightly slow to respond, mild tremor.  Skin: Warm, no lesions or rashes   CT scan 12/08/12 --  Comparison: 05/03/2012, 09/02/2011 and 07/06/2011.  Findings: Mediastinal lymph nodes measure up to 1.5 cm in the  precarinal station, stable. Hilar regions are difficult to  definitively evaluate without IV contrast. No axillary adenopathy.  Pulmonary arteries are borderline enlarged. Coronary artery  calcification. Heart is at the upper limits of normal in size.  Small amount  of pericardial fluid is likely physiologic.  Biapical pleural parenchymal scarring. Peribronchovascular  nodularity and scattered air space consolidation have progressed,  especially in the right middle and right lower lobes. Minimal  associated bronchiectasis. Probable mucoid impaction in the  anterior left upper lobe (image 24), unchanged. No pleural fluid.  Airway is unremarkable.  Incidental imaging of the upper abdomen shows no acute findings.  No worrisome lytic or sclerotic lesions.  IMPRESSION:  1. Progressive peribronchovascular nodularity and airspace  consolidation in the right lung, favoring an ongoing  atypical/mycobacterial infectious process.  2. Mediastinal lymph nodes may be reactive and are stable.      Assessment & Plan:  Bronchiectasis without acute exacerbation Stable. No changes in current regimen.

## 2013-06-20 NOTE — Patient Instructions (Addendum)
Please continue your your medications as you are currently using them Follow with Dr Delton Coombes in 6 months or sooner if you have any problems

## 2013-06-21 ENCOUNTER — Ambulatory Visit: Payer: Medicare Other

## 2013-06-28 ENCOUNTER — Ambulatory Visit: Payer: Medicare Other | Admitting: Physical Therapy

## 2013-06-28 ENCOUNTER — Encounter: Payer: Medicare Other | Admitting: Occupational Therapy

## 2013-07-13 ENCOUNTER — Ambulatory Visit (INDEPENDENT_AMBULATORY_CARE_PROVIDER_SITE_OTHER): Payer: Medicare Other | Admitting: Neurology

## 2013-07-13 ENCOUNTER — Encounter: Payer: Self-pay | Admitting: Neurology

## 2013-07-13 VITALS — BP 158/73 | HR 71 | Temp 98.2°F | Ht 62.0 in | Wt 104.0 lb

## 2013-07-13 DIAGNOSIS — F0390 Unspecified dementia without behavioral disturbance: Secondary | ICD-10-CM

## 2013-07-13 DIAGNOSIS — G250 Essential tremor: Secondary | ICD-10-CM

## 2013-07-13 DIAGNOSIS — G252 Other specified forms of tremor: Secondary | ICD-10-CM

## 2013-07-13 DIAGNOSIS — G40909 Epilepsy, unspecified, not intractable, without status epilepticus: Secondary | ICD-10-CM

## 2013-07-13 NOTE — Progress Notes (Signed)
Subjective:    Patient ID: Audrey Obrien is a 79 y.o. female.  HPI   Interim history:   Audrey Obrien is a very pleasant 79 year old right-handed woman who presents for followup consultation of her advanced dementia, Sz d/o and tremors. She is accompanied by her husband today. This is her first visit after Dr. Imagene Obrien retirement and she was last seen by Dr. Fayrene Fearing Obrien on 01/15/2013, at which time he checked her phenytoin level. She is in hospice. She is just on very little medication per hospice. She is at risk for aspiration and she is blind. She has an underlying medical history of hypertension, seizures, tremors, glaucoma, corneal transplant. She is on Dilantin and baby aspirin. I reviewed Dr. Imagene Obrien prior notes and records: She has a complex history. She was diagnosed with spasmodic Obrien and was followed at [facility] and had Botox injections, last one was a year ago. She has a prosthetic eye on the right. She had glaucoma surgery on the left. She has a family history of memory loss. She has had a fairly rapid decline in her memory since 2010. At that time her Audrey Obrien was 26. In March last year she had 3 seizures. MRI brain did not show an acute stroke. EEG showed generalized background slowing. At her last visit in October 2013 her Audrey Obrien was 11. She has been under hospice for 90 days since January of this year till April. She was not eating well at the time, but has gained weight is 104. She was then in PT and OT, which she finished. She saw neurology at [facility] in June 2014 and her Dilantin 100mg  bid and Primidone 50 mg bid and ASA 81 mg. Her Dilantin level was 3.8 in June. She has an appointment with them next year. She has remained seizure-free. She is eating a little better per husband.  Her Past Medical History Is Significant For: Past Medical History  Diagnosis Date  . Hypertension   . Acne rosacea   . Chronic rhinitis     no benefit from antihistamines  .  Gallbladder & bile duct stone 2007    1.6 cm GB stone; no symptoms  . Pneumonia 2007  . 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 C. difficile colitis 11/07  . [facility]     botox injs at [facility]  . Hypertension   . Scleroderma     probable, ANA+ 1:160, anticentromers strongly pos, USRNP-, TH/TO-,RF equivocal, CCP-, Skin puffiness of hands, telangectasias of hands, raynauds, ECHO increased pulmonary pressures 8/11, pfts stable  . S/P right heart catheterization 10/11  . Benign essential tremor   . Osteoporosis     started alendronate 6/12    Her Past Surgical History Is Significant For: Past Surgical History  Procedure Laterality Date  . Lung surgery  1982  . Corneal transplant      Her Family History Is Significant For: Family History  Problem Relation Age of Onset  . Asthma Son     Her Social History Is Significant For: History   Social History  . Marital Status: Married    Spouse Name: N/A    Number of Children: N/A  . Years of Education: N/A   Occupational History  . retired Ship broker   Social History Main Topics  . Smoking status: Never Smoker   . Smokeless tobacco: Never Used  . Alcohol Use: No  . Drug Use: No  .  Sexually Active: None   Other Topics Concern  . None   Social History Narrative  . None    Her Allergies Are:  No Known Allergies:   Her Current Medications Are:  Outpatient Encounter Prescriptions as of 07/13/2013  Medication Sig Dispense Refill  . albuterol (PROVENTIL HFA;VENTOLIN HFA) 108 (90 BASE) MCG/ACT inhaler Inhale 2 puffs into the lungs every 4 (four) hours as needed for shortness of breath.  1 Inhaler  11  . aspirin 81 MG tablet Take 81 mg by mouth at bedtime.       . Cephalexin 250 MG tablet Take 250 mg by mouth daily.      . Chlorphen-Pyril-Phenyleph 01-15-09 MG TABS Take 2 tablets by mouth daily.      . phenytoin (DILANTIN) 100 MG ER capsule Take 100 mg by mouth 3 (three) times daily. BID      .  primidone (MYSOLINE) 50 MG tablet Take 50-100 mg by mouth 2 (two) times daily. 2 tabs in the morning and 1 tab at night      . [DISCONTINUED] ALLERGY 10 MG tablet TAKE 1 TABLET EVERY EVENING  90 tablet  0  . [DISCONTINUED] Spacer/Aero Chamber Mouthpiece MISC 1 as directed  1 each  0   No facility-administered encounter medications on file as of 07/13/2013.  : Review of Systems  HENT: Positive for hearing loss, rhinorrhea and tinnitus.   Eyes: Positive for visual disturbance.  Neurological: Positive for speech difficulty.       Memory loss  Psychiatric/Behavioral: Positive for confusion.    Objective:  Neurologic Exam  Physical Exam Physical Examination:   Filed Vitals:   07/13/13 1100  BP: 158/73  Pulse: 71  Temp: 98.2 F (36.8 C)   General Examination: The patient is a very pleasant 79 y.o. female in no acute distress. She is calm and cooperative with the exam. She denies Auditory Hallucinations and Visual Hallucinations.   HEENT: Normocephalic, atraumatic, eyes are mostly closed, she has ptosis on the L Extraocular tracking is impaired. Hearing is impaired. Face is symmetric with no facial masking. There is a mild head tremor. Neck is not rigid with intact passive ROM. There are no carotid bruits on auscultation. Oropharynx exam reveals moderate mouth dryness. No significant airway crowding is noted. Mallampati is class II. She has spasmodic Obrien and it is hard for her to speak and she has to make a significant effort. She has a trembling in her voice.    Chest: is clear to auscultation without wheezing, rhonchi or crackles noted.  Heart: sounds are regular and normal without murmurs, rubs or gallops noted.   Abdomen: is soft, non-tender and non-distended with normal bowel sounds appreciated on auscultation.  Extremities: There is no pitting edema in the distal lower extremities bilaterally.   Skin: is warm and dry with no trophic changes noted.  Musculoskeletal: exam  reveals no obvious joint deformities, tenderness or joint swelling or erythema.  Neurologically:  Mental status: The patient is awake and alert, paying good  attention. She is unable to provide the history. Her husband provides the entire Hx. She is oriented to: situation. Her memory, attention, language and knowledge are impaired significantly. There is no aphasia, agnosia, apraxia or anomia. There is a moderate degree of bradyphrenia. Speech is c/w spasmodic Obrien. Mood is congruent and affect is normal. She says: "I think I am doing ok".    Cranial nerves are as described above under HEENT exam. In addition, shoulder shrug is normal with  equal shoulder height noted.  Motor exam: think bulk and global trength of 4/5. Tone is not rigid with absence of cogwheeling in the extremities. There is overall no bradykinesia. There is no drift or rebound. There is a mild UE action and postural tremor.  Romberg is not tested. Reflexes are 2+ in the upper extremities and 1+ in the lower extremities. Fine motor skills: Finger taps, hand movements, and rapid alternating patting are mildly impaired bilaterally. Foot taps and foot agility are moderately impaired bilaterally.   Cerebellar testing shows no dysmetria or intention tremor on finger to nose testing. Heel to shin is unremarkable. There is no truncal or gait ataxia.   Sensory exam is intact to light touch, pinprick, vibration, temperature sense and proprioception in the upper and lower extremities.   Gait, station and balance: Are not tested because she is blind and did not bring a walker. She situated in her wheelchair.   Assessment and Plan:   In summary, Audrey Obrien is a very pleasant 79 y.o.-year old female with a complex medical history who has had seizures last year at the time of her corneal transplant. She has advanced memory loss. She has tremors which are in keeping with essential tremor. I had a long chat with her and her husband today.  She is at a point where she is relatively stable. I would not increase her primidone as she has advanced memory loss and it can cause confusion and hallucinations. I would not increase her Dilantin at this time even though her level was low. Since she has not had any further seizures I do not see a reason to increase her Dilantin. I suggested that she followup with her primary care physician from now on and keep her followup appointment with [facility] next year for seizure checkup. He mentioned that they may taper off of his seizure medication she has remained seizure-free. She did not need any refills today. Her husband is agreeable to pursuing followup with her primary care physician from now on. He had no additional questions for me today.

## 2013-07-13 NOTE — Patient Instructions (Signed)
I think overall you are doing fairly well and are stable at this point.   I do have some generic suggestions for you today: Please make sure that you drink plenty of fluids.  As far as your medications are concerned, I would like to suggest: no changes.    Follow up with Neurology at [facility] next year as planned.   I do not think we need to make any changes in your medications at this point. I think you're stable enough that you can follow up with your PCP, Dr. Earl Gala.

## 2013-12-20 ENCOUNTER — Ambulatory Visit (INDEPENDENT_AMBULATORY_CARE_PROVIDER_SITE_OTHER): Payer: Medicare Other | Admitting: Emergency Medicine

## 2013-12-20 ENCOUNTER — Encounter: Payer: Self-pay | Admitting: Emergency Medicine

## 2013-12-20 VITALS — BP 140/72 | HR 77 | Ht 65.0 in | Wt 108.0 lb

## 2013-12-20 DIAGNOSIS — R059 Cough, unspecified: Secondary | ICD-10-CM

## 2013-12-20 DIAGNOSIS — J479 Bronchiectasis, uncomplicated: Secondary | ICD-10-CM

## 2013-12-20 DIAGNOSIS — R05 Cough: Secondary | ICD-10-CM

## 2013-12-20 DIAGNOSIS — R053 Chronic cough: Secondary | ICD-10-CM

## 2013-12-20 DIAGNOSIS — J309 Allergic rhinitis, unspecified: Secondary | ICD-10-CM

## 2013-12-20 NOTE — Assessment & Plan Note (Signed)
She has benefited from chlorpheniramine, will continue prn

## 2013-12-20 NOTE — Assessment & Plan Note (Signed)
Clinically stable at this time. No indication to repeat her CT scan

## 2013-12-20 NOTE — Assessment & Plan Note (Signed)
Due to PND but also a likely component of aspiration. Her husband works with her on aspiration precautions.

## 2013-12-20 NOTE — Patient Instructions (Signed)
Please continue your same medications as you have been doing Follow with Dr Delton CoombesByrum as needed for any problems or questions.

## 2013-12-20 NOTE — Progress Notes (Signed)
Subjective:    Patient ID: Audrey Obrien, female    DOB: 06/26/1931, 80 y.o.   MRN: [id number]  HPI 80 yo never smoker w hx scleroderma, HTN, GERD, chronic cough (2-3 yrs). 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 I saw her in the hospital 9/27 for cough and hemoptysis. CT scan of the chest 9/27 showed nodular parenchymal opacity within the RUL with a  'tree in bud ' appearance, progressed compared with 06/2011. FOB done 9/28 showed old blood in the RUL posterior segment, BAL has been smear negative for AFB and fungal (final cx pending). The bacterial cx grew sensitive pseudomonas. Cytology on BAL negative. She has nasal gtt and a lot of throat mucous. Has tried nasonex before, not sure it helped. Not currently on anything. Started mucinex a week ago.   ROV 10/12/11 -- scleroderma, chronic cough, bronchiectasis. BAL done 9/28 = [facility]. Started on ethambutol, rifampin, clarithromycin on 09/22/11. She has tolerated the antibiotics, has had a single episode diarrhea, has noticed a rash on her legs. Remains on the loratadine and fluticasone.  She believes that her mucous production is improved.   ROV 10/21/11 - scleroderma, chronic cough, bronchiectasis. Dx with [facility] on BAL. Started 3 drug rx but she developed rash, had to stop all 3 meds. Returns to discuss next plans. She says the rash is better, has low appetite.   ROV 11/22/11 -- scleroderma, chronic cough, bronchiectasis. Dx with [facility] on BAL. Attempted to start clarithro alone (due to rash on 3 drug rx), but we had to stop it due to lack of appetite, nausea. Stopped it 3 days ago.  ROV 05/22/12 -- scleroderma, chronic cough, bronchiectasis. Dx with [facility] on BAL 09/03/11. Was unable to tolerate 3 drug therapy as above. Repeaqt CT scan 6/12 shows little change, maybe some possible improvement in tree-bud pattern, a more prominent L sided nodule, likely inflammatory.  She restarted clarithro in  January, then added ethambutol 2 weeks later - but no longer taking. Has been on the clarithro for 5 months.  Her cough is dry, more rare than last visit.   OV 09/04/2012 Acute OV for cough, mucus, sounded like PND, upper airway cough  ROV 12/27/12 -- scleroderma, chronic cough, PND, bronchiectasis. Has been [facility]ith[facility] + [facility] Repeat CT scan 12/08/12 -- some mild progression of micronodular disease as below. She reports that since last time she has lost her sight, her functional capacity has significantly declined  - dementia, scleroderma, dysphagia, blindness. She has some occasional cough, less congestion.   ROV 06/21/13 -- scleroderma, chronic cough, PND, bronchiectasis. [facility]. She is doing well. Cough is better on chlorpheniramine bid.  Uses albuterol prn - rarely.   ROV 12/20/13 -- scleroderma, chronic cough, PND, bronchiectasis. [facility]ith[facility] + [facility]. She has been on chlorpheniramine, cough stable. She has dry mouth, minimal PO water intake. She has been doing much better since last visit. She was receiving hospice care but now of of it. Chronic keflex for recurrent UTI's      Objective:   Physical Exam Filed Vitals:   12/20/13 1352  BP: 140/72  Pulse: 77  Height: 5\' 5"  (1.651 m)  Weight: 108 lb (48.988 kg)  SpO2: 95%   Gen: Pleasant, ill appearing in wheelchair, in no distress, some tremor, ? parkinsonian affect  ENT: No lesions,  mouth clear,  oropharynx clear, gravel voice quality, some postnasal drip  Neck: No JVD, no TMG, no carotid bruits  Lungs: No use of accessory muscles, no dullness to percussion, clear without rales or rhonchi  Cardiovascular: RRR, heart sounds normal, no murmur or gallops, no peripheral edema  Musculoskeletal: No deformities, no cyanosis or clubbing  Neuro: alert, non focal, slightly slow to respond, mild tremor.   Skin: Warm, no lesions or rashes   CT scan 12/08/12 --   Comparison: 05/03/2012, 09/02/2011 and 07/06/2011.  Findings: Mediastinal lymph nodes measure up to 1.5 cm in the  precarinal station, stable. Hilar regions are difficult to  definitively evaluate without IV contrast. No axillary adenopathy.  Pulmonary arteries are borderline enlarged. Coronary artery  calcification. Heart is at the upper limits of normal in size.  Small amount of pericardial fluid is likely physiologic.  Biapical pleural parenchymal scarring. Peribronchovascular  nodularity and scattered air space consolidation have progressed,  especially in the right middle and right lower lobes. Minimal  associated bronchiectasis. Probable mucoid impaction in the  anterior left upper lobe (image 24), unchanged. No pleural fluid.  Airway is unremarkable.  Incidental imaging of the upper abdomen shows no acute findings.  No worrisome lytic or sclerotic lesions.  IMPRESSION:  1. Progressive peribronchovascular nodularity and airspace  consolidation in the right lung, favoring an ongoing  atypical/mycobacterial infectious process.  2. Mediastinal lymph nodes may be reactive and are stable.      Assessment & Plan:  Bronchiectasis without acute exacerbation Clinically stable at this time. No indication to repeat her CT scan   Chronic allergic rhinitis She has benefited from chlorpheniramine, will continue prn  Chronic cough Due to PND but also a likely component of aspiration. Her husband works with her on aspiration precautions.

## 2014-04-04 ENCOUNTER — Ambulatory Visit (INDEPENDENT_AMBULATORY_CARE_PROVIDER_SITE_OTHER): Payer: Medicare Other | Admitting: Internal Medicine

## 2014-04-04 ENCOUNTER — Encounter: Payer: Self-pay | Admitting: Internal Medicine

## 2014-04-04 VITALS — BP 183/79 | HR 76 | Temp 98.1°F | Ht 64.0 in | Wt 109.0 lb

## 2014-04-04 DIAGNOSIS — N3090 Cystitis, unspecified without hematuria: Secondary | ICD-10-CM

## 2014-04-04 NOTE — Assessment & Plan Note (Addendum)
I discussed history and things they can try to help alleviate symptoms.  She has near continuous symptoms with no relief.  I discussed with the husband what it isn't.  I discussed that this is not infeciton. The fact that it is almost always E coli is because it is colonization.  This is unlikely to ever go away.  I recommended not taking antibiotics for UTI unless she has a fever, elevated WBC associated with new symptoms.  I also recommend against using daily prophylaxis since there is no known benefit and it is known to lead to resistance.      I did suggest using Motrin prn, keep hydrated. Also estrogen cream for vagina.     The husband and patient were in agreement with the plan.   He can return PRN.

## 2014-04-04 NOTE — Progress Notes (Signed)
   Subjective:    Patient ID: Audrey Obrien, female    DOB: 11/11/1931, 80 y.o.   MRN: [id number]  HPI Here for recurrent symptoms of dysuria, vaginal pain. Follows urology and has been placed on chronic suppressive antibiotics.   Has been on numerous courses of antibiotics with no relief.  Not associated with fever, elevated WBC.  Goes to multiple different providers and gets checked for "UTI" and gets antibiotics since it is always positive.  Mainly E coli, has had Pseudomonas.  Gets some relief with antibiotics but is short lived if at all.     Also with bronchiectasis and followed by pulmonary.     Review of Systems  Constitutional: Negative for fever, fatigue and unexpected weight change.  Gastrointestinal: Negative for nausea and diarrhea.  Genitourinary: Positive for decreased urine volume, vaginal pain and pelvic pain.  Skin: Negative for rash.  Neurological: Negative for dizziness and light-headedness.       Objective:   Physical Exam  Constitutional: She appears well-developed.  Thin, in wheelchair, HOH, blind  Eyes: No scleral icterus.  Musculoskeletal: She exhibits no edema.  Lymphadenopathy:    She has no cervical adenopathy.  Skin: No rash noted.          Assessment & Plan:

## 2014-04-30 ENCOUNTER — Other Ambulatory Visit: Payer: Self-pay

## 2014-04-30 DIAGNOSIS — Z1231 Encounter for screening mammogram for malignant neoplasm of breast: Secondary | ICD-10-CM

## 2014-05-27 ENCOUNTER — Ambulatory Visit: Payer: Medicare Other

## 2014-05-29 ENCOUNTER — Ambulatory Visit
Admission: RE | Admit: 2014-05-29 | Discharge: 2014-05-29 | Disposition: A | Discharge status: Home / Self Care | Payer: Medicare Other | Source: Ambulatory Visit

## 2014-05-29 ENCOUNTER — Encounter (INDEPENDENT_AMBULATORY_CARE_PROVIDER_SITE_OTHER): Payer: Self-pay

## 2014-05-29 DIAGNOSIS — Z1231 Encounter for screening mammogram for malignant neoplasm of breast: Secondary | ICD-10-CM

## 2015-02-04 ENCOUNTER — Ambulatory Visit (INDEPENDENT_AMBULATORY_CARE_PROVIDER_SITE_OTHER): Payer: Medicare Other | Admitting: Neurology

## 2015-02-04 ENCOUNTER — Encounter: Payer: Self-pay | Admitting: Neurology

## 2015-02-04 VITALS — BP 159/80 | HR 86 | Temp 98.1°F | Resp 14

## 2015-02-04 DIAGNOSIS — Z970 Presence of artificial eye: Secondary | ICD-10-CM

## 2015-02-04 DIAGNOSIS — H543 Unqualified visual loss, both eyes: Secondary | ICD-10-CM

## 2015-02-04 DIAGNOSIS — R269 Unspecified abnormalities of gait and mobility: Secondary | ICD-10-CM

## 2015-02-04 DIAGNOSIS — G250 Essential tremor: Secondary | ICD-10-CM | POA: Diagnosis not present

## 2015-02-04 DIAGNOSIS — H540 Blindness, both eyes: Secondary | ICD-10-CM

## 2015-02-04 DIAGNOSIS — F0390 Unspecified dementia without behavioral disturbance: Secondary | ICD-10-CM

## 2015-02-04 DIAGNOSIS — F03C0 Unspecified dementia, severe, without behavioral disturbance, psychotic disturbance, mood disturbance, and anxiety: Secondary | ICD-10-CM

## 2015-02-04 NOTE — Patient Instructions (Addendum)
I think physical therapy may help.   I would recommend tapering off the tramadol as it can lower seizure threshold. Try it just at night for now.   You may use tylenol as needed, may once in the morning for now.   I would recommend no other medication changes.   Your primary care physician can refill your primidone prescription at this point.   Follow up with me as needed.

## 2015-02-04 NOTE — Progress Notes (Signed)
Subjective:    Patient ID: Audrey Obrien is a 81 y.o. female.  HPI      Dear Dr. Michail Sermon,  I saw your patient, [name], upon your kind request in my neurologic clinic today for initial consultation of her memory loss and gait disorder. The patient is accompanied by her husband today. As you know, Ms. Mennen is a 82 year old right-handed woman with an underlying medical history of chronic rhinitis, glaucoma, spasmodic dysphonia, hypertension, right eye blindness, status post corneal transplant on L with left eye blindness as well, status post prosthetic right eye, scleroderma, essential tremor, prior diagnosis of dementia and peripheral neuropathy, as well as seizure disorders. I met her for the first time on 07/13/2013 after Dr. Tressia Danas retirement for follow-up of her memory loss. She was deemed relatively stable at the time and I suggested follow-up with her primary care physician.  Today, her husband reports, that her Dilantin was discontinued when she was on hospice last year and some other medications were discontinued as well. She was following with neurology at [facility]. She has been on primidone 250 mg strength half a pill once daily. She is having more difficulty performing easy or simple tasks such as standing, lifting her foot, brushing her teeth. He has to assist her with all activities of daily living. Thankfully she has not fallen. She is in her wheelchair. She is scheduled to start outpatient physical therapy next week.    She was diagnosed with spasmodic dysphonia and was followed at [facility] and had Botox injections, last one was a year ago. She has a prosthetic eye on the right. She had glaucoma surgery on the left. She has a family history of memory loss. She has had a fairly rapid decline in her memory since 2010. At that time her MMSE was 26. In March 2013 she had 3 seizures. MRI brain did not show an acute stroke. EEG showed  generalized background slowing. At her last visit in October 2013 her MMSE was 9. She has been under hospice for 90 days since January 2014 to April 2014. She was not eating well at the time, but then gained weight is 104. She was then in PT and OT, which she finished. She saw neurology at [facility] in June 2014 and her Dilantin 142m bid and Primidone 50 mg bid and ASA 81 mg. Her Dilantin level was 3.8 in June. She had a FU appointment with them in 2015. She had remained seizure-free.   Her Past Medical History Is Significant For: Past Medical History  Diagnosis Date  . Hypertension   . Acne rosacea   . Chronic rhinitis     no benefit from antihistamines  . Gallbladder & bile duct stone 2007    1.6 cm GB stone; no symptoms  . Pneumonia 2007  . 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 C. difficile colitis 11/07  . Spastic dysphonia     [facility] . Hypertension   . Scleroderma     probable, ANA+ 1:160, anticentromers strongly pos, USRNP-, TH/TO-,RF equivocal, CCP-, Skin puffiness of hands, telangectasias of hands, raynauds, ECHO increased pulmonary pressures 8/11, pfts stable  . S/P right heart catheterization 10/11  . Benign essential tremor   . Osteoporosis     started alendronate 6/12  . Glaucoma   . Spastic dysphonia     [facility] . Benign essential  tremor   . Dementia   . Raynaud disease   . Blindness     per husband, both eyes    Her Past Surgical History Is Significant For: Past Surgical History  Procedure Laterality Date  . Lung surgery  1982  . Corneal transplant      Her Family History Is Significant For: Family History  Problem Relation Age of Onset  . Asthma Son     Her Social History Is Significant For: History   Social History  . Marital Status: Married    Spouse Name: N/A  . Number of Children: 2  . Years of Education: [employer]   Occupational History  . retired [employer]   Social History Main Topics  .  Smoking status: Never Smoker   . Smokeless tobacco: Never Used  . Alcohol Use: No  . Drug Use: No  . Sexual Activity: Not on file   Other Topics Concern  . None   Social History Narrative   2 cups a day    Her Allergies Are:  No Known Allergies:   Her Current Medications Are:  Outpatient Encounter Prescriptions as of 02/04/2015  Medication Sig  . aspirin 81 MG tablet Take 81 mg by mouth at bedtime.   . Chlorpheniramine Maleate (CHLORPHEN MALEATE PO) Take by mouth.  . Homeopathic Products (AZO YEAST PLUS) TABS Take by mouth.  . losartan (COZAAR) 50 MG tablet Take 50 mg by mouth daily.  . phenazopyridine (PYRIDIUM) 100 MG tablet Take 100 mg by mouth daily. After a meal  . phenytoin (DILANTIN) 100 MG ER capsule Take 300 mg by mouth 3 (three) times daily.  . primidone (MYSOLINE) 250 MG tablet Take 250 mg by mouth 1 day or 1 dose. Takes 1/2 tab daily  . traMADol (ULTRAM) 50 MG tablet Take 50 mg by mouth every 6 (six) hours as needed.  . trimethoprim (TRIMPEX) 100 MG tablet Take 100 mg by mouth daily.  . URELLE (URELLE/URISED) 81 MG TABS tablet Take 1 tablet by mouth 4 (four) times daily.  :  Review of Systems:  Out of a complete 14 point review of systems, all are reviewed and negative with the exception of these symptoms as listed below:   Review of Systems  Constitutional: Positive for chills.  HENT: Positive for hearing loss and tinnitus.   Eyes:       Blind  Respiratory: Positive for cough and shortness of breath.   Endocrine:       "Feeling cold"  Skin:       Itching  Neurological: Positive for tremors and weakness.       Memory loss, Confusion, Dizziness, Restless legs    Objective:  Neurologic Exam  Physical Exam Physical Examination:   Filed Vitals:   02/04/15 1358  BP: 159/80  Pulse: 86  Temp: 98.1 F (36.7 C)  Resp: 14   General Examination: The patient is a 81 y.o. female in no acute distress. She is calm and cooperative with the exam. She is situated  in a wheelchair. She's not able to provide any history but answers some questions appropriately. She is thin and frail appearing.    HEENT: Normocephalic, atraumatic, eyes are mostly closed, she has ptosis on the L Extraocular tracking is impaired. Hearing is impaired.  she has a right prosthetic eye. She is status post left eye corneal transplant. Face is symmetric with no facial masking. There is a mild head tremor. Neck is not rigid with intact passive ROM. There   are no carotid bruits on auscultation. Oropharynx exam reveals moderate mouth dryness. No significant airway crowding is noted. Mallampati is class II. She has spasmodic dysphonia and it is hard for her to speak and she has to make a significant effort. She has a trembling in her voice, all unchanged.    Chest: is clear to auscultation without wheezing, rhonchi or crackles noted.  Heart: sounds are regular and normal without murmurs, rubs or gallops noted.   Abdomen: is soft, non-tender and non-distended with normal bowel sounds appreciated on auscultation.  Extremities: There is no pitting edema in the distal lower extremities bilaterally.   Skin: is warm and dry with no trophic changes noted.  Musculoskeletal: exam reveals no obvious joint deformities, tenderness or joint swelling or erythema.  Neurologically:  Mental status: The patient is awake and alert, paying little attention. She is unable to provide the history. Her husband provides the entire Hx. She is oriented to: situation and self. Her memory, attention, language and knowledge are impaired significantly. There is no aphasia, agnosia, apraxia or anomia. There is a moderate degree of bradyphrenia. Speech is c/w spasmodic dysphonia. Mood is congruent and affect is normal. She says: "I am ok".   On 02/04/2015: MMSE 5/30, CDT: 0/4, AFT: 4.  Cranial nerves are as described above under HEENT exam. In addition, shoulder shrug is normal with equal shoulder height noted.  Motor  exam: thin bulk and global trength of 4/5. Tone is not rigid with absence of cogwheeling in the extremities. There is overall no bradykinesia. There is no drift or rebound. There is a mild UE action and postural tremor.  Romberg is not tested. Reflexes are 2+ in the upper extremities and 1+ in the lower extremities. Fine motor skills: Finger taps, hand movements, and rapid alternating patting are mildly impaired bilaterally. Foot taps and foot agility are moderately impaired bilaterally.   Sensory exam is intact to light touch.   Gait, station and balance: Are not tested because she is blind and did not bring a walker. She situated in her wheelchair.   Assessment and Plan:   In summary, Jayra M Steinhardt is a very pleasant 84-year old female with a complex medical history who presents for gait dysfunction and advanced memory loss. Clinically she appears unchanged from 2014. She has a history of tremors and has been on primidone 125 mg daily. She has been off of seizure medication. She has not had any seizures since 2013 according to her husband. She is scheduled to start outpatient physical therapy next week. I believe her gait dysfunction is primarily a result from her advanced memory loss. Chronic tremors, advancing age, overall frailty, and blindness are certainly contributing factors. I'm not sure if she will have any sustained benefit from physical therapy but certainly worth trying. At this juncture, there is probably not a whole lot I can add to her care. She has an unchanged interim history for me. I saw her in August 2014 and she appears largely unchanged but her memory score has declined some. I talked at length with her husband regarding the challenges of advanced dementia. Her tremor and her spasmodic dysphonia seem unchanged as well. At this juncture, I will ask that she continue with her current medications. I would recommend that she be tapered off of tramadol as it can lower seizure threshold. I  would not suggest any new dementia medication. In particular, Aricept can lower seizure threshold as well. I suggested that she followup with you and   I can see her as needed. I answered all his questions today and her husband was in agreement.

## 2015-02-11 ENCOUNTER — Encounter: Payer: Self-pay | Admitting: Physical Therapy

## 2015-02-11 ENCOUNTER — Ambulatory Visit: Payer: Medicare Other | Attending: Internal Medicine | Admitting: Physical Therapy

## 2015-02-11 DIAGNOSIS — R262 Difficulty in walking, not elsewhere classified: Secondary | ICD-10-CM | POA: Insufficient documentation

## 2015-02-11 DIAGNOSIS — M810 Age-related osteoporosis without current pathological fracture: Secondary | ICD-10-CM | POA: Insufficient documentation

## 2015-02-11 DIAGNOSIS — R269 Unspecified abnormalities of gait and mobility: Secondary | ICD-10-CM | POA: Diagnosis not present

## 2015-02-11 DIAGNOSIS — I10 Essential (primary) hypertension: Secondary | ICD-10-CM | POA: Insufficient documentation

## 2015-02-11 DIAGNOSIS — F0280 Dementia in other diseases classified elsewhere without behavioral disturbance: Secondary | ICD-10-CM | POA: Insufficient documentation

## 2015-02-11 DIAGNOSIS — G309 Alzheimer's disease, unspecified: Secondary | ICD-10-CM | POA: Insufficient documentation

## 2015-02-11 NOTE — Therapy (Signed)
[facility] [address] [place], [place], [address] Phone: [phone]   Fax:  [phone]  Physical Therapy Evaluation  Patient Details  Name: Audrey Obrien MRN: [id number] Date of Birth: 04/20/1931 Referring Provider:  Benjaman Kindlersborne, Jim, MD  Encounter Date: 02/11/2015      PT End of Session - 02/11/15 1740    Visit Number 1  G1   Number of Visits 17   Date for PT Re-Evaluation 04/12/15  03-14-15 for STG   Authorization Type Medicare/Tricare   PT Start Time 1402   PT Stop Time 1446   PT Time Calculation (min) 44 min      Past Medical History  Diagnosis Date  . Hypertension   . Acne rosacea   . Chronic rhinitis     no benefit from antihistamines  . Gallbladder & bile duct stone 2007    1.6 cm GB stone; no symptoms  . Pneumonia 2007  . 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 C. difficile colitis 11/07  . Spastic dysphonia     [facility]  . Hypertension   . Scleroderma     probable, ANA+ 1:160, anticentromers strongly pos, USRNP-, TH/TO-,RF equivocal, CCP-, Skin puffiness of hands, telangectasias of hands, raynauds, ECHO increased pulmonary pressures 8/11, pfts stable  . S/P right heart catheterization 10/11  . Benign essential tremor   . Osteoporosis     started alendronate 6/12  . Glaucoma   . Spastic dysphonia     [facility]  . Benign essential tremor   . Dementia   . Raynaud disease   . Blindness     per husband, both eyes    Past Surgical History  Procedure Laterality Date  . Lung surgery  1982  . Corneal transplant      There were no vitals taken for this visit.  Visit Diagnosis:  Abnormality of gait - Plan: PT plan of care cert/re-cert      Subjective Assessment - 02/11/15 1416    Symptoms Husband reports pt. has gradually declined in maintaining balance during past year; pt. has been blind for past 3 yrs since undergoing cornea transplant in  Feb. 2013   Pertinent History Alzheimer's; cornea transplant Feb. 2013   Patient Stated Goals Improve balance - improve ability to perform sit to stand transfer; improve walking; improve ability to transfer from supine to sitting position   Currently in Pain? No/denies          [facility] - 02/11/15 1424    Assessment   Medical Diagnosis Gait abnormality; Alzheimer's disease;   Onset Date --  Feb. 2013; decline in mobility June 2015   Prior Therapy May 2014   Precautions   Precautions Fall;Other (comment)  blind   Balance Screen   Has the patient fallen in the past 6 months No   Has the patient had a decrease in activity level because of a fear of falling?  Yes   Is the patient reluctant to leave their home because of a fear of falling?  Yes   Home Environment   Living Enviornment Private residence   Type of Home House   Home Layout Two level   Alternate Level Stairs-Number of Steps 2   Prior Function   Level of Independence Needs assistance with ADLs;Needs assistance with homemaking;Needs assistance with gait;Needs assistance with transfers   Strength   Overall Strength Within functional  limits for tasks performed   Bed Mobility   Bed Mobility Supine to Sit   Supine to Sit 4: Min guard   Sit to Supine 5: Supervision   Transfers   Transfers Sit to Stand   Sit to Stand 4: Min guard   Ambulation/Gait   Ambulation/Gait Yes   Ambulation/Gait Assistance 4: Min guard  needs directional cues due to lack of vision   Ambulation Distance (Feet) 120 Feet   Assistive device Other (Comment)  hand held assist   Gait Pattern Decreased stride length;Decreased step length - left;Decreased step length - right   Ambulation Surface Level                            PT Short Term Goals - 02/11/15 1745    PT SHORT TERM GOAL #1   Title Pt.'s husband will report at least 25% improvement in sit to stand transfers at home.   Baseline target 03-14-15   Time 4    Period Weeks   Status New   PT SHORT TERM GOAL #2   Title Amb. 250' with min hand held assist on flat even surface.   Baseline target date 03-14-15   Time 4   Period Weeks   Status New   PT SHORT TERM GOAL #3   Title Perform HEP with husband's assist   Baseline target date 03-14-15   Time 4   Period Weeks   Status New   PT SHORT TERM GOAL #4   Title Perform bed mobility supine to sit with CGA.   Baseline target date 03-14-15   Time 4   Period Weeks   Status New           PT Long Term Goals - 02/11/15 2101    PT LONG TERM GOAL #1   Title Perform sit to stand transfer with CGA.   Baseline target date 04-12-15   Time 8   Period Weeks   Status New   PT LONG TERM GOAL #2   Title Amb. 500' with CGA  on flat even surface for incr. community accessibility.   Baseline target date 04-12-15   Time 8   Period Weeks   Status New   PT LONG TERM GOAL #3   Title Perform bed mobility including rolling and sit to/from supine with CGA.   Baseline target date 04-12-15   Time 8   Period Weeks   Status New   PT LONG TERM GOAL #4   Title Stand for at least 5" with UE support on counter prn with CGA (per husband's report) for incr. independence with ADL's   Baseline target date 04-12-15   Time 8   Period Weeks   Status New               Plan - 02/11/15 1741    Clinical Impression Statement Pt.'s lack of vision contributes significantly to decr. mobility; pt. able to respond to cues for postural adjustments to maintain balance; cognitive deficits also contribute to mobility deficits   Pt will benefit from skilled therapeutic intervention in order to improve on the following deficits Abnormal gait;Decreased coordination;Decreased endurance;Decreased balance;Impaired vision/preception;Decreased cognition;Decreased mobility;Decreased strength   Rehab Potential Good   PT Frequency 2x / week   PT Duration 8 weeks   PT Treatment/Interventions ADLs/Self Care Home Management;Therapeutic  activities;Patient/family education;Therapeutic exercise;Gait training;Balance training;Stair training;Neuromuscular re-education;Functional mobility training   PT Next Visit Plan instruct husband in HEP for  balance and functional strengthening    PT Home Exercise Plan see above   Consulted and Agree with Plan of Care Patient;Family member/caregiver   Family Member Consulted spouse          G-Codes - 07-Mar-2015 05/01/2107    Functional Assessment Tool Used clinical judgment   Functional Limitation Mobility: Walking and moving around   Mobility: Walking and Moving Around Current Status [phone]) At least 60 percent but less than 80 percent impaired, limited or restricted   Mobility: Walking and Moving Around Goal Status [phone]) At least 40 percent but less than 60 percent impaired, limited or restricted       Problem List Patient Active Problem List   Diagnosis Date Noted  . MAIC (mycobacterium avium-intracellulare complex) 04/16/2012  . Physical deconditioning 04/05/2012  . Dilantin toxicity 03/31/2012  . Cystitis 03/28/2012  . Hyponatremia 03/28/2012  . Dehydration 03/28/2012  . HTN (hypertension) 03/28/2012  . Seizure disorder 03/28/2012  . Scleroderma 03/28/2012  . Dysphagia 03/28/2012  . Weakness generalized 03/28/2012  . Peripheral edema 03/28/2012  . Chronic cough 09/15/2011  . Bronchiectasis without acute exacerbation 09/15/2011  . Chronic allergic rhinitis 09/15/2011  . GERD (gastroesophageal reflux disease) 09/15/2011    Halil Rentz, Donavan Burnet, PT 03-07-15, 9:14 PM  [facility] [address] [place], [place], [address] Phone: [phone]   Fax:  [phone]

## 2015-02-25 ENCOUNTER — Ambulatory Visit: Payer: Medicare Other | Admitting: Physical Therapy

## 2015-02-25 DIAGNOSIS — R269 Unspecified abnormalities of gait and mobility: Secondary | ICD-10-CM

## 2015-02-26 ENCOUNTER — Encounter: Payer: Self-pay | Admitting: Physical Therapy

## 2015-02-26 NOTE — Therapy (Signed)
[facility] [address] [place], [place], [address] Phone: [phone]   Fax:  [phone]  Physical Therapy Treatment  Patient Details  Name: Audrey Obrien MRN: [id number] Date of Birth: 11-Jun-1931 Referring Provider:  Kristie Cowman, MD  Encounter Date: 02/25/2015      PT End of Session - 02/26/15 0953    Visit Number 2  G2   Number of Visits 17   Date for PT Re-Evaluation 04/12/15   Authorization Type Medicare/Tricare   PT Start Time 1107   PT Stop Time 1150   PT Time Calculation (min) 43 min      Past Medical History  Diagnosis Date  . Hypertension   . Acne rosacea   . Chronic rhinitis     no benefit from antihistamines  . Gallbladder & bile duct stone 2007    1.6 cm GB stone; no symptoms  . Pneumonia 2007  . 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 C. difficile colitis 11/07  . Spastic dysphonia     botox injs at [facility]  . Hypertension   . Scleroderma     probable, ANA+ 1:160, anticentromers strongly pos, USRNP-, TH/TO-,RF equivocal, CCP-, Skin puffiness of hands, telangectasias of hands, raynauds, ECHO increased pulmonary pressures 8/11, pfts stable  . S/P right heart catheterization 10/11  . Benign essential tremor   . Osteoporosis     started alendronate 6/12  . Glaucoma   . Spastic dysphonia     Get [facility] at [facility]  . Benign essential tremor   . Dementia   . Raynaud disease   . Blindness     per husband, both eyes    Past Surgical History  Procedure Laterality Date  . Lung surgery  1982  . Corneal transplant      There were no vitals filed for this visit.  Visit Diagnosis:  Abnormality of gait      Subjective Assessment - 02/26/15 0951    Symptoms husband reports pt. leans backward when he is trying to get her to walk - he is positioned behind her    Pertinent History Alzheimer's; cornea transplant Feb. 2013   Patient Stated Goals Improve balance -  improve ability to perform sit to stand transfer; improve walking; improve ability to transfer from supine to sitting position   Currently in Pain? No/denies                       [facility] Adult PT Treatment/Exercise - 02/26/15 0001    Transfers   Transfers Sit to Stand   Sit to Stand 4: Min guard   Ambulation/Gait   Ambulation/Gait Yes   Ambulation/Gait Assistance 4: Min guard  needs directional cues due to lack of vision   Ambulation Distance (Feet) 120 Feet  3 reps   Assistive device Other (Comment)  hand held assist   Gait Pattern Decreased stride length;Decreased step length - left;Decreased step length - right   Ambulation Surface Level      TherEx;  nustep level 4 x 5"; 3# weight used for bil. Hip flexion, abduction, and ext. In standing x 10 reps Heel raises x 10 reps NeuroRe-ed: standing balance kicks - forward, back and side x 10 reps each; marching in place x 10 reps Sidestepping inside bars with UE support with CGA          PT Education - 02/26/15 0952    Education  provided Yes   Education Details instructed husband to stand on pt.'s side and give hand held assist during gait to avoid pt. leaning posteriorly   Person(s) Educated Patient;Spouse   Methods Explanation;Demonstration   Comprehension Verbalized understanding;Returned demonstration          PT Short Term Goals - 02/11/15 1745    PT SHORT TERM GOAL #1   Title Pt.'s husband will report at least 25% improvement in sit to stand transfers at home.   Baseline target 03-14-15   Time 4   Period Weeks   Status New   PT SHORT TERM GOAL #2   Title Amb. 250' with min hand held assist on flat even surface.   Baseline target date 03-14-15   Time 4   Period Weeks   Status New   PT SHORT TERM GOAL #3   Title Perform HEP with husband's assist   Baseline target date 03-14-15   Time 4   Period Weeks   Status New   PT SHORT TERM GOAL #4   Title Perform bed mobility supine to sit with CGA.    Baseline target date 03-14-15   Time 4   Period Weeks   Status New           PT Long Term Goals - 02/11/15 2101    PT LONG TERM GOAL #1   Title Perform sit to stand transfer with CGA.   Baseline target date 04-12-15   Time 8   Period Weeks   Status New   PT LONG TERM GOAL #2   Title Amb. 500' with CGA  on flat even surface for incr. community accessibility.   Baseline target date 04-12-15   Time 8   Period Weeks   Status New   PT LONG TERM GOAL #3   Title Perform bed mobility including rolling and sit to/from supine with CGA.   Baseline target date 04-12-15   Time 8   Period Weeks   Status New   PT LONG TERM GOAL #4   Title Stand for at least 5" with UE support on counter prn with CGA (per husband's report) for incr. independence with ADL's   Baseline target date 04-12-15   Time 8   Period Weeks   Status New               Plan - 02/26/15 0955    Clinical Impression Statement Pt. did well with sit to stand transfers and with amb. with hand held assist on right side with verbal cues due to lack of sight   Pt will benefit from skilled therapeutic intervention in order to improve on the following deficits Abnormal gait;Decreased coordination;Decreased endurance;Decreased balance;Impaired vision/preception;Decreased cognition;Decreased mobility;Decreased strength   Rehab Potential Good   PT Frequency 2x / week   PT Duration 8 weeks   PT Treatment/Interventions ADLs/Self Care Home Management;Therapeutic activities;Patient/family education;Therapeutic exercise;Gait training;Balance training;Stair training;Neuromuscular re-education;Functional mobility training   PT Next Visit Plan instruct husband in HEP for balance and functional strengthening    PT Home Exercise Plan see above   Consulted and Agree with Plan of Care Patient;Family member/caregiver   Family Member Consulted spouse        Problem List Patient Active Problem List   Diagnosis Date Noted  . MAIC  (mycobacterium avium-intracellulare complex) 04/16/2012  . Physical deconditioning 04/05/2012  . Dilantin toxicity 03/31/2012  . Cystitis 03/28/2012  . Hyponatremia 03/28/2012  . Dehydration 03/28/2012  . HTN (hypertension) 03/28/2012  . Seizure disorder 03/28/2012  .  Scleroderma 03/28/2012  . Dysphagia 03/28/2012  . Weakness generalized 03/28/2012  . Peripheral edema 03/28/2012  . Chronic cough 09/15/2011  . Bronchiectasis without acute exacerbation 09/15/2011  . Chronic allergic rhinitis 09/15/2011  . GERD (gastroesophageal reflux disease) 09/15/2011    Gurley Climer, Donavan BurnetLinda Suzanne, PT 02/26/2015, 9:57 AM  [facility] [address] [place], [place], [address] Phone: [phone]   Fax:  [phone]

## 2015-02-27 ENCOUNTER — Ambulatory Visit: Payer: Medicare Other | Admitting: Physical Therapy

## 2015-02-27 DIAGNOSIS — R269 Unspecified abnormalities of gait and mobility: Secondary | ICD-10-CM | POA: Diagnosis not present

## 2015-02-28 ENCOUNTER — Encounter: Payer: Self-pay | Admitting: Physical Therapy

## 2015-02-28 NOTE — Therapy (Signed)
[facility] [address] [place], [place], [address] Phone: [phone]   Fax:  [phone]  Physical Therapy Treatment  Patient Details  Name: Audrey Obrien MRN: [id number] Date of Birth: [birth year] Referring Provider:  Kristie Cowman, MD  Encounter Date: 02/27/2015      PT End of Session - 02/28/15 1635    Visit Number 3  G3   Number of Visits 17   Date for PT Re-Evaluation 04/12/15   Authorization Type Medicare/Tricare   PT Start Time 1101   PT Stop Time 1147   PT Time Calculation (min) 46 min      Past Medical History  Diagnosis Date  . Hypertension   . Acne rosacea   . Chronic rhinitis     no benefit from antihistamines  . Gallbladder & bile duct stone 2007    1.6 cm GB stone; no symptoms  . Pneumonia 2007  . 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 C. difficile colitis 11/07  . Spastic dysphonia     botox injs at [facility]  . Hypertension   . Scleroderma     probable, ANA+ 1:160, anticentromers strongly pos, USRNP-, TH/TO-,RF equivocal, CCP-, Skin puffiness of hands, telangectasias of hands, raynauds, ECHO increased pulmonary pressures 8/11, pfts stable  . S/P right heart catheterization 10/11  . Benign essential tremor   . Osteoporosis     started alendronate 6/12  . Glaucoma   . Spastic dysphonia     Get Botox at [facility]  . Benign essential tremor   . Dementia   . Raynaud disease   . Blindness     per husband, both eyes    Past Surgical History  Procedure Laterality Date  . Lung surgery  1982  . Corneal transplant      There were no vitals filed for this visit.  Visit Diagnosis:  Abnormality of gait      Subjective Assessment - 02/28/15 1632    Symptoms Husband reports pt. is doing a little better walking at home with him on her side rather than in back of her   Pertinent History Alzheimer's; cornea transplant Feb. 2013; pt is blind; Dilantin overdose -  seizures   Patient Stated Goals Improve balance - improve ability to perform sit to stand transfer; improve walking; improve ability to transfer from supine to sitting position   Currently in Pain? No/denies                            Balance Exercises - 02/28/15 1633    OTAGO PROGRAM   Sit to Stand 10 reps, bilateral support   Balance Exercises: Seated   Dynamic Sitting Anterior/posterior weight shift;Lateral weight shift  pt is blind; 10 reps each direction with min to CGA   Heel Raises Both;10 reps     TherAct:  Worked on bed mobility - pt. Transferred sit to supine with min assist; practiced rolling supine to left side  X 3 reps, back to supine then to right side with tactile and instructional cues for UE placement; onto right side x 3 reps Pt. Transferred right sidelying to sitting positioning with mod assist x 1 rep, min assist x 2nd rep  Gait; 120' x 1 rep with min hand held assist; 120' x 1 rep with min to CGA with min hand held assist with cues for  Directions TherEx;  Standing hip  flexion, ext, abduction with 3# weight x 10 reps;  Nustep level 3 x 5"       PT Short Term Goals - 02/11/15 1745    PT SHORT TERM GOAL #1   Title Pt.'s husband will report at least 25% improvement in sit to stand transfers at home.   Baseline target 03-14-15   Time 4   Period Weeks   Status New   PT SHORT TERM GOAL #2   Title Amb. 250' with min hand held assist on flat even surface.   Baseline target date 03-14-15   Time 4   Period Weeks   Status New   PT SHORT TERM GOAL #3   Title Perform HEP with husband's assist   Baseline target date 03-14-15   Time 4   Period Weeks   Status New   PT SHORT TERM GOAL #4   Title Perform bed mobility supine to sit with CGA.   Baseline target date 03-14-15   Time 4   Period Weeks   Status New           PT Long Term Goals - 02/11/15 2101    PT LONG TERM GOAL #1   Title Perform sit to stand transfer with CGA.   Baseline target  date 04-12-15   Time 8   Period Weeks   Status New   PT LONG TERM GOAL #2   Title Amb. 500' with CGA  on flat even surface for incr. community accessibility.   Baseline target date 04-12-15   Time 8   Period Weeks   Status New   PT LONG TERM GOAL #3   Title Perform bed mobility including rolling and sit to/from supine with CGA.   Baseline target date 04-12-15   Time 8   Period Weeks   Status New   PT LONG TERM GOAL #4   Title Stand for at least 5" with UE support on counter prn with CGA (per husband's report) for incr. independence with ADL's   Baseline target date 04-12-15   Time 8   Period Weeks   Status New               Plan - 02/28/15 1635    Clinical Impression Statement Pt. did well with bed mobility with tactile cues for bringing LUE across chest to roll onto left side;  pt. has tendency to lean posteriorly with sit to stand transfers   Pt will benefit from skilled therapeutic intervention in order to improve on the following deficits Abnormal gait;Decreased coordination;Decreased endurance;Decreased balance;Impaired vision/preception;Decreased cognition;Decreased mobility;Decreased strength   Rehab Potential Good   PT Frequency 2x / week   PT Duration 8 weeks   PT Treatment/Interventions ADLs/Self Care Home Management;Therapeutic activities;Patient/family education;Therapeutic exercise;Gait training;Balance training;Stair training;Neuromuscular re-education;Functional mobility training   PT Next Visit Plan instruct husband in HEP for balance and functional strengthening ;  cont bed mobility training   PT Home Exercise Plan see above   Consulted and Agree with Plan of Care Patient;Family member/caregiver   Family Member Consulted spouse        Problem List Patient Active Problem List   Diagnosis Date Noted  . MAIC (mycobacterium avium-intracellulare complex) 04/16/2012  . Physical deconditioning 04/05/2012  . Dilantin toxicity 03/31/2012  . Cystitis 03/28/2012   . Hyponatremia 03/28/2012  . Dehydration 03/28/2012  . HTN (hypertension) 03/28/2012  . Seizure disorder 03/28/2012  . Scleroderma 03/28/2012  . Dysphagia 03/28/2012  . Weakness generalized 03/28/2012  . Peripheral edema 03/28/2012  .  Chronic cough 09/15/2011  . Bronchiectasis without acute exacerbation 09/15/2011  . Chronic allergic rhinitis 09/15/2011  . GERD (gastroesophageal reflux disease) 09/15/2011    Breon Rehm, Donavan Burnet, PT 02/28/2015, 4:39 PM  [facility] [address] [place], [place], [address] Phone: [phone]   Fax:  [phone]

## 2015-03-10 ENCOUNTER — Ambulatory Visit: Payer: Medicare Other | Attending: Internal Medicine | Admitting: Physical Therapy

## 2015-03-10 DIAGNOSIS — F0280 Dementia in other diseases classified elsewhere without behavioral disturbance: Secondary | ICD-10-CM | POA: Insufficient documentation

## 2015-03-10 DIAGNOSIS — R262 Difficulty in walking, not elsewhere classified: Secondary | ICD-10-CM | POA: Insufficient documentation

## 2015-03-10 DIAGNOSIS — M810 Age-related osteoporosis without current pathological fracture: Secondary | ICD-10-CM | POA: Insufficient documentation

## 2015-03-10 DIAGNOSIS — I10 Essential (primary) hypertension: Secondary | ICD-10-CM | POA: Diagnosis not present

## 2015-03-10 DIAGNOSIS — G309 Alzheimer's disease, unspecified: Secondary | ICD-10-CM | POA: Insufficient documentation

## 2015-03-10 DIAGNOSIS — R269 Unspecified abnormalities of gait and mobility: Secondary | ICD-10-CM | POA: Insufficient documentation

## 2015-03-11 ENCOUNTER — Encounter: Payer: Self-pay | Admitting: Physical Therapy

## 2015-03-11 NOTE — Therapy (Signed)
[facility] [address] [place], [place], [address] Phone: [phone]   Fax:  [phone]  Physical Therapy Treatment  Patient Details  Name: Audrey ChristiansJennie M Hammersmith MRN: [id number] Date of Birth: 06/24/1931 Referring Provider:  Kristie CowmanSchooler, Karen, MD  Encounter Date: 03/10/2015      PT End of Session - 03/11/15 1739    Visit Number 4  G4   Number of Visits 17   Date for PT Re-Evaluation 04/12/15   Authorization Type Medicare/Tricare   PT Start Time 1446   PT Stop Time 1533   PT Time Calculation (min) 47 min      Past Medical History  Diagnosis Date  . Hypertension   . Acne rosacea   . Chronic rhinitis     no benefit from antihistamines  . Gallbladder & bile duct stone 2007    1.6 cm GB stone; no symptoms  . Pneumonia 2007  . 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 C. difficile colitis 11/07  . Spastic dysphonia     [facility]  . Hypertension   . Scleroderma     probable, ANA+ 1:160, anticentromers strongly pos, USRNP-, TH/TO-,RF equivocal, CCP-, Skin puffiness of hands, telangectasias of hands, raynauds, ECHO increased pulmonary pressures 8/11, pfts stable  . S/P right heart catheterization 10/11  . Benign essential tremor   . Osteoporosis     started alendronate 6/12  . Glaucoma   . Spastic dysphonia     [facility]  . Benign essential tremor   . Dementia   . Raynaud disease   . Blindness     per husband, both eyes    Past Surgical History  Procedure Laterality Date  . Lung surgery  1982  . Corneal transplant      There were no vitals filed for this visit.  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 No/denies                       [facility] Adult PT Treatment/Exercise - 03/11/15 0001    Transfers   Transfers Sit to Stand   Sit to Stand 4: Min guard   Ambulation/Gait   Ambulation/Gait Yes   Ambulation/Gait Assistance 4: Min guard  needs directional cues due to lack of vision   Ambulation Distance (Feet) 120 Feet   Assistive device Other (Comment)  hand held assist   Gait Pattern Decreased stride length;Decreased step length - left;Decreased step length - right   Ambulation Surface Level     Neuro re-ed; sit to stand x 10 reps with minimal UE support; tap ups 10 reps each leg with tactile cues with min assist for balance recovery  TherEx: 3# weight used on right and left leg for hip flexion, extension, and abduction 10 reps each;  Nustep level 3 x 5"           PT Short Term Goals - 02/11/15 1745  PT SHORT TERM GOAL #1   Title Pt.'s husband will report at least 25% improvement in sit to stand transfers at home.   Baseline target 03-14-15   Time 4   Period Weeks   Status New   PT SHORT TERM GOAL #2   Title Amb. 250' with min hand held assist on flat even surface.   Baseline target date 03-14-15   Time 4   Period Weeks   Status New   PT SHORT TERM GOAL #3   Title Perform HEP with husband's assist   Baseline target date 03-14-15   Time 4   Period Weeks   Status New   PT SHORT TERM GOAL #4   Title Perform bed mobility supine to sit with CGA.   Baseline target date 03-14-15   Time 4   Period Weeks   Status New           PT Long Term Goals - 02/11/15 2101    PT LONG TERM GOAL #1   Title Perform sit to stand transfer with CGA.   Baseline target date 04-12-15   Time 8   Period Weeks   Status New   PT LONG TERM GOAL #2   Title Amb. 500'  with CGA  on flat even surface for incr. community accessibility.   Baseline target date 04-12-15   Time 8   Period Weeks   Status New   PT LONG TERM GOAL #3   Title Perform bed mobility including rolling and sit to/from supine with CGA.   Baseline target date 04-12-15   Time 8   Period Weeks   Status New   PT LONG TERM GOAL #4   Title Stand for at least 5" with UE support on counter prn with CGA (per husband's report) for incr. independence with ADL's   Baseline target date 04-12-15   Time 8   Period Weeks   Status New               Plan - 03/11/15 1739    Clinical Impression Statement Pt. did well with sit to stand transfers and with transfer from wheelchair to mat - does better with tactile cues due to visual deficits   Pt will benefit from skilled therapeutic intervention in order to improve on the following deficits Abnormal gait;Decreased coordination;Decreased endurance;Decreased balance;Impaired vision/preception;Decreased cognition;Decreased mobility;Decreased strength   Rehab Potential Good   PT Frequency 2x / week   PT Duration 8 weeks   PT Treatment/Interventions ADLs/Self Care Home Management;Therapeutic activities;Patient/family education;Therapeutic exercise;Gait training;Balance training;Stair training;Neuromuscular re-education;Functional mobility training   PT Next Visit Plan instruct husband in HEP for balance and functional strengthening ;  cont bed mobility training   PT Home Exercise Plan see above   Consulted and Agree with Plan of Care Patient;Family member/caregiver   Family Member Consulted spouse        Problem List Patient Active Problem List   Diagnosis Date Noted  . MAIC (mycobacterium avium-intracellulare complex) 04/16/2012  . Physical deconditioning 04/05/2012  . Dilantin toxicity 03/31/2012  . Cystitis 03/28/2012  . Hyponatremia 03/28/2012  . Dehydration 03/28/2012  . HTN (hypertension) 03/28/2012  . Seizure disorder 03/28/2012  .  Scleroderma 03/28/2012  . Dysphagia 03/28/2012  . Weakness generalized 03/28/2012  . Peripheral edema 03/28/2012  . Chronic cough 09/15/2011  . Bronchiectasis without acute exacerbation 09/15/2011  . Chronic allergic rhinitis 09/15/2011  . GERD (gastroesophageal reflux disease) 09/15/2011    Madilynn Montante, Donavan Burnet, PT 03/11/2015, 5:42 PM  [facility] [address] [place], [place], [address] Phone: [phone]   Fax:  [phone]

## 2015-03-13 ENCOUNTER — Encounter: Payer: Self-pay | Admitting: Physical Therapy

## 2015-03-13 ENCOUNTER — Ambulatory Visit: Payer: Medicare Other | Admitting: Physical Therapy

## 2015-03-13 DIAGNOSIS — R269 Unspecified abnormalities of gait and mobility: Secondary | ICD-10-CM | POA: Diagnosis not present

## 2015-03-13 NOTE — Therapy (Signed)
[facility] [address] [place], [place], [address] Phone: [phone]   Fax:  [phone]  Physical Therapy Treatment  Patient Details  Name: Audrey Obrien MRN: [id number] Date of Birth: July 29, 1931 Referring Provider:  Kristie Cowman, MD  Encounter Date: 03/13/2015      PT End of Session - 03/13/15 1732    Visit Number 5  G5   Number of Visits 17   Date for PT Re-Evaluation 04/12/15   Authorization Type Medicare/Tricare   PT Start Time 1450   PT Stop Time 1535   PT Time Calculation (min) 45 min      Past Medical History  Diagnosis Date  . Hypertension   . Acne rosacea   . Chronic rhinitis     no benefit from antihistamines  . Gallbladder & bile duct stone 2007    1.6 cm GB stone; no symptoms  . Pneumonia 2007  . 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 C. difficile colitis 11/07  . Spastic dysphonia     botox injs at [facility]  . Hypertension   . Scleroderma     probable, ANA+ 1:160, anticentromers strongly pos, USRNP-, TH/TO-,RF equivocal, CCP-, Skin puffiness of hands, telangectasias of hands, raynauds, ECHO increased pulmonary pressures 8/11, pfts stable  . S/P right heart catheterization 10/11  . Benign essential tremor   . Osteoporosis     started alendronate 6/12  . Glaucoma   . Spastic dysphonia     Get Botox at [facility]  . Benign essential tremor   . Dementia   . Raynaud disease   . Blindness     per husband, both eyes    Past Surgical History  Procedure Laterality Date  . Lung surgery  1982  . Corneal transplant      There were no vitals filed for this visit.  Visit Diagnosis:  Abnormality of gait      Subjective Assessment - 03/13/15 1730    Subjective Husband states that pt. 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 No/denies                       [facility] Adult PT Treatment/Exercise - 03/13/15 1731    Transfers   Transfers Sit to Stand   Sit to Stand 4: Min guard   Ambulation/Gait   Ambulation/Gait Yes   Ambulation/Gait Assistance 4: Min guard   Ambulation Distance (Feet) 120 Feet  2 reps   Assistive device Other (Comment)  hand held assist   Gait Pattern Decreased stride length;Decreased step length - left;Decreased step length - right   Ambulation Surface Level     NeuroRe-ed:  Sitting on SitFit - in chair - weight shifts anterior/posteriorly x 10 reps and laterally x 10 reps; seated Marching on SitFit x 10 reps with CGA; sit to stand x 10 reps with CGA to min assist;  Alternate tap ups to 4" step With mod to max hand held assist  TherEx:  3# weight used on each LE for hip flexion, abduction, and extension x 10  reps each with UE support Standing hip and knee flexion with 3# weight x 10 reps each         PT Education - 03/13/15 1734    Education provided Yes   Education Details perform bil. hamstring stretching in seated position   Person(s) Educated Patient;Spouse   Methods Explanation;Demonstration   Comprehension Verbalized understanding;Returned demonstration          PT Short Term Goals - 02/11/15 1745    PT SHORT TERM GOAL #1   Title Pt.'s husband will report at least 25% improvement in sit to stand transfers at home.   Baseline target 03-14-15   Time 4   Period Weeks   Status New   PT SHORT TERM GOAL #2   Title Amb. 250' with min hand held assist on flat even surface.   Baseline target date 03-14-15   Time 4   Period Weeks   Status New   PT SHORT TERM GOAL #3   Title Perform HEP with husband's assist   Baseline target date 03-14-15   Time 4    Period Weeks   Status New   PT SHORT TERM GOAL #4   Title Perform bed mobility supine to sit with CGA.   Baseline target date 03-14-15   Time 4   Period Weeks   Status New           PT Long Term Goals - 02/11/15 2101    PT LONG TERM GOAL #1   Title Perform sit to stand transfer with CGA.   Baseline target date 04-12-15   Time 8   Period Weeks   Status New   PT LONG TERM GOAL #2   Title Amb. 500' with CGA  on flat even surface for incr. community accessibility.   Baseline target date 04-12-15   Time 8   Period Weeks   Status New   PT LONG TERM GOAL #3   Title Perform bed mobility including rolling and sit to/from supine with CGA.   Baseline target date 04-12-15   Time 8   Period Weeks   Status New   PT LONG TERM GOAL #4   Title Stand for at least 5" with UE support on counter prn with CGA (per husband's report) for incr. independence with ADL's   Baseline target date 04-12-15   Time 8   Period Weeks   Status New               Plan - 03/13/15 1732    Clinical Impression Statement Pt. has inconsistent performance with sit to stand transfers - cont. to lean posteriorly at times and needs tactile cues to lean anteriorly; needs cues to widen BOS to assist with static standing balance   Pt will benefit from skilled therapeutic intervention in order to improve on the following deficits Abnormal gait;Decreased coordination;Decreased endurance;Decreased balance;Impaired vision/preception;Decreased cognition;Decreased mobility;Decreased strength   Rehab Potential Good   PT Frequency 2x / week   PT Duration 8 weeks   PT Treatment/Interventions ADLs/Self Care Home Management;Therapeutic activities;Patient/family education;Therapeutic exercise;Gait training;Balance training;Stair training;Neuromuscular re-education;Functional mobility training   PT Next Visit Plan instruct husband in HEP for balance and functional strengthening ;  cont bed mobility training   PT Home Exercise Plan  see above   Consulted and Agree with Plan of Care Patient;Family member/caregiver   Family Member Consulted spouse        Problem List Patient Active Problem List   Diagnosis Date Noted  . MAIC (mycobacterium  avium-intracellulare complex) 04/16/2012  . Physical deconditioning 04/05/2012  . Dilantin toxicity 03/31/2012  . Cystitis 03/28/2012  . Hyponatremia 03/28/2012  . Dehydration 03/28/2012  . HTN (hypertension) 03/28/2012  . Seizure disorder 03/28/2012  . Scleroderma 03/28/2012  . Dysphagia 03/28/2012  . Weakness generalized 03/28/2012  . Peripheral edema 03/28/2012  . Chronic cough 09/15/2011  . Bronchiectasis without acute exacerbation 09/15/2011  . Chronic allergic rhinitis 09/15/2011  . GERD (gastroesophageal reflux disease) 09/15/2011    Dyana Magner, Donavan Burnet, PT 03/13/2015, 5:37 PM  [facility] [address] [place], [place], [address] Phone: [phone]   Fax:  [phone]

## 2015-03-17 ENCOUNTER — Ambulatory Visit: Payer: Medicare Other | Admitting: Physical Therapy

## 2015-03-17 DIAGNOSIS — R269 Unspecified abnormalities of gait and mobility: Secondary | ICD-10-CM

## 2015-03-18 ENCOUNTER — Encounter: Payer: Self-pay | Admitting: Physical Therapy

## 2015-03-18 NOTE — Therapy (Signed)
[facility] [address] [place], [place], [address] Phone: [phone]   Fax:  [phone]  Physical Therapy Treatment  Patient Details  Name: Audrey Obrien MRN: [id number] Date of Birth: 1931/10/25 Referring Provider:  Kristie Cowman, MD  Encounter Date: 03/17/2015      PT End of Session - 03/18/15 1729    Visit Number 6  G6   Number of Visits 17   Date for PT Re-Evaluation 04/12/15   Authorization Type Medicare/Tricare   PT Start Time 1450   PT Stop Time 1530   PT Time Calculation (min) 40 min      Past Medical History  Diagnosis Date  . Hypertension   . Acne rosacea   . Chronic rhinitis     no benefit from antihistamines  . Gallbladder & bile duct stone 2007    1.6 cm GB stone; no symptoms  . Pneumonia 2007  . 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 C. difficile colitis 11/07  . Spastic dysphonia     botox injs at [facility]  . Hypertension   . Scleroderma     probable, ANA+ 1:160, anticentromers strongly pos, USRNP-, TH/TO-,RF equivocal, CCP-, Skin puffiness of hands, telangectasias of hands, raynauds, ECHO increased pulmonary pressures 8/11, pfts stable  . S/P right heart catheterization 10/11  . Benign essential tremor   . Osteoporosis     started alendronate 6/12  . Glaucoma   . Spastic dysphonia     Get [facility] at [facility]  . Benign essential tremor   . Dementia   . Raynaud disease   . Blindness     per husband, both eyes    Past Surgical History  Procedure Laterality Date  . Lung surgery  1982  . Corneal transplant      There were no vitals filed for this visit.  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 No/denies                       [facility] Adult PT Treatment/Exercise - 03/18/15 0001    Transfers   Transfers Sit to Stand   Sit to Stand 4: Min guard   Ambulation/Gait   Ambulation/Gait Yes   Ambulation/Gait Assistance 4: Min guard   Ambulation Distance (Feet) 120 Feet  2 reps   Assistive device Other (Comment)  hand held assist   Gait Pattern Decreased stride length;Decreased step length - left;Decreased step length - right   Ambulation Surface Level                  PT Short Term Goals - 02/11/15 1745    PT SHORT TERM GOAL #1   Title Pt.'s husband will report at least 25% improvement in sit to stand transfers at home.   Baseline target 03-14-15   Time 4   Period Weeks   Status New   PT SHORT TERM GOAL #2   Title Amb. 250' with min hand held assist on flat even surface.   Baseline target date 03-14-15   Time  4   Period Weeks   Status New   PT SHORT TERM GOAL #3   Title Perform HEP with husband's assist   Baseline target date 03-14-15   Time 4   Period Weeks   Status New   PT SHORT TERM GOAL #4   Title Perform bed mobility supine to sit with CGA.   Baseline target date 03-14-15   Time 4   Period Weeks   Status New           PT Long Term Goals - 02/11/15 2101    PT LONG TERM GOAL #1   Title Perform sit to stand transfer with CGA.   Baseline target date 04-12-15   Time 8   Period Weeks   Status New   PT LONG TERM GOAL #2   Title Amb. 500' with CGA  on flat even surface for incr. community accessibility.   Baseline target date 04-12-15   Time 8   Period Weeks   Status New   PT LONG TERM GOAL #3   Title Perform bed mobility including rolling and sit to/from supine with CGA.   Baseline target date 04-12-15   Time 8   Period Weeks   Status New   PT LONG TERM GOAL #4   Title Stand for at least 5" with UE support on counter prn with CGA  (per husband's report) for incr. independence with ADL's   Baseline target date 04-12-15   Time 8   Period Weeks   Status New               Plan - 03/18/15 1729    Clinical Impression Statement progressing towards goals   Pt will benefit from skilled therapeutic intervention in order to improve on the following deficits Abnormal gait;Decreased coordination;Decreased endurance;Decreased balance;Impaired vision/preception;Decreased cognition;Decreased mobility;Decreased strength   Rehab Potential Good   PT Frequency 2x / week   PT Duration 8 weeks   PT Treatment/Interventions ADLs/Self Care Home Management;Therapeutic activities;Patient/family education;Therapeutic exercise;Gait training;Balance training;Stair training;Neuromuscular re-education;Functional mobility training   PT Next Visit Plan balance and gait   PT Home Exercise Plan see above   Consulted and Agree with Plan of Care Patient;Family member/caregiver   Family Member Consulted spouse        Problem List Patient Active Problem List   Diagnosis Date Noted  . MAIC (mycobacterium avium-intracellulare complex) 04/16/2012  . Physical deconditioning 04/05/2012  . Dilantin toxicity 03/31/2012  . Cystitis 03/28/2012  . Hyponatremia 03/28/2012  . Dehydration 03/28/2012  . HTN (hypertension) 03/28/2012  . Seizure disorder 03/28/2012  . Scleroderma 03/28/2012  . Dysphagia 03/28/2012  . Weakness generalized 03/28/2012  . Peripheral edema 03/28/2012  . Chronic cough 09/15/2011  . Bronchiectasis without acute exacerbation 09/15/2011  . Chronic allergic rhinitis 09/15/2011  . GERD (gastroesophageal reflux disease) 09/15/2011    Shaniya Tashiro, Donavan BurnetLinda Suzanne, PT 03/18/2015, 5:31 PM  [facility] [address] [place], [place], [address] Phone: [phone]   Fax:  [phone]

## 2015-03-20 ENCOUNTER — Ambulatory Visit: Payer: Medicare Other | Admitting: Physical Therapy

## 2015-03-20 DIAGNOSIS — R269 Unspecified abnormalities of gait and mobility: Secondary | ICD-10-CM

## 2015-03-21 ENCOUNTER — Encounter: Payer: Self-pay | Admitting: Physical Therapy

## 2015-03-21 NOTE — Therapy (Signed)
[facility] [address] [place], [place], [address] Phone: [phone]   Fax:  [phone]  Physical Therapy Treatment  Patient Details  Name: BONNETTA ALLBEE MRN: [id number] Date of Birth: [birth year] Referring Provider:  Dorian Heckle, MD  Encounter Date: 03/20/2015      PT End of Session - 03/21/15 1036    Visit Number 7  G7   Number of Visits 17   Date for PT Re-Evaluation 04/12/15   Authorization Type Medicare/Tricare   PT Start Time 1450   PT Stop Time 1536   PT Time Calculation (min) 46 min      Past Medical History  Diagnosis Date  . Hypertension   . Acne rosacea   . Chronic rhinitis     no benefit from antihistamines  . Gallbladder & bile duct stone 2007    1.6 cm GB stone; no symptoms  . Pneumonia 2007  . 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 C. difficile colitis 11/07  . Spastic dysphonia     botox injs at [facility]  . Hypertension   . Scleroderma     probable, ANA+ 1:160, anticentromers strongly pos, USRNP-, TH/TO-,RF equivocal, CCP-, Skin puffiness of hands, telangectasias of hands, raynauds, ECHO increased pulmonary pressures 8/11, pfts stable  . S/P right heart catheterization 10/11  . Benign essential tremor   . Osteoporosis     started alendronate 6/12  . Glaucoma   . Spastic dysphonia     Get Botox at [facility]  . Benign essential tremor   . Dementia   . Raynaud disease   . Blindness     per husband, both eyes    Past Surgical History  Procedure Laterality Date  . Lung surgery  1982  . Corneal transplant      There were no vitals filed for this visit.  Visit Diagnosis:  Abnormality of gait      Subjective Assessment - 03/21/15 1033    Subjective Husband reports pt. able to roll more at home in bed - improving with bed mobility; states pt reported some discomfort in RLE on day following last PT appt. 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 No/denies                       [facility] Adult PT Treatment/Exercise - 03/21/15 0001    Transfers   Transfers Sit to Stand   Sit to Stand 4: Min guard   Ambulation/Gait   Ambulation/Gait Yes   Ambulation/Gait Assistance 4: Min guard   Ambulation Distance (Feet) 120 Feet  2 reps   Assistive device Other (Comment)  hand held assist   Gait Pattern Decreased stride length;Decreased step length - left;Decreased step length - right   Ambulation Surface Level;Indoor      TherEx:  Bil. LE strengthening -hip flexion, extension, abduction with 3# weight x 10 reps each in standing: marching In place with 3# weight on each LE x 10 reps each; Nustep level 3 x 4" with bil. UE's and LE's   NeuroRe-ed: sidestepping inside bars 10' x 4 reps  with UE with SBA: alternate tap ups to 8" step with tactile cues with UE  Support:  rockerboard with UE support x 1" anterior/posteriorly         PT Short Term Goals - 03/21/15 1041    PT SHORT TERM GOAL #1   Title Pt.'s husband will report at least 25% improvement in sit to stand transfers at home.   Baseline inconsistent per husband's report -- 03-20-15   Status On-going   PT SHORT TERM GOAL #2   Title Amb. 250' with min hand held assist on flat even surface.   Baseline met 03-17-15   Status Achieved   PT SHORT TERM GOAL #3   Title Perform HEP with husband's assist   Baseline ongoing  03-20-15   Status On-going   PT SHORT TERM GOAL #4   Title Perform bed mobility supine to sit with CGA.   Baseline met in clinic but husband reports inconsistent performance at home 03-20-15   Status On-going           PT Long Term  Goals - 02/11/15 2101    PT LONG TERM GOAL #1   Title Perform sit to stand transfer with CGA.   Baseline target date 04-12-15   Time 8   Period Weeks   Status New   PT LONG TERM GOAL #2   Title Amb. 500' with CGA  on flat even surface for incr. community accessibility.   Baseline target date 04-12-15   Time 8   Period Weeks   Status New   PT LONG TERM GOAL #3   Title Perform bed mobility including rolling and sit to/from supine with CGA.   Baseline target date 04-12-15   Time 8   Period Weeks   Status New   PT LONG TERM GOAL #4   Title Stand for at least 5" with UE support on counter prn with CGA (per husband's report) for incr. independence with ADL's   Baseline target date 04-12-15   Time 8   Period Weeks   Status New               Problem List Patient Active Problem List   Diagnosis Date Noted  . MAIC (mycobacterium avium-intracellulare complex) 04/16/2012  . Physical deconditioning 04/05/2012  . Dilantin toxicity 03/31/2012  . Cystitis 03/28/2012  . Hyponatremia 03/28/2012  . Dehydration 03/28/2012  . HTN (hypertension) 03/28/2012  . Seizure disorder 03/28/2012  . Scleroderma 03/28/2012  . Dysphagia 03/28/2012  . Weakness generalized 03/28/2012  . Peripheral edema 03/28/2012  . Chronic cough 09/15/2011  . Bronchiectasis without acute exacerbation 09/15/2011  . Chronic allergic rhinitis 09/15/2011  . GERD (gastroesophageal reflux disease) 09/15/2011    Tukker Byrns, [place], PT 03/21/2015, 10:45 AM  [facility] [address] [place], [place], [address] Phone: [phone]   Fax:  [phone]

## 2015-03-24 ENCOUNTER — Ambulatory Visit: Payer: Medicare Other | Admitting: Physical Therapy

## 2015-03-24 DIAGNOSIS — R269 Unspecified abnormalities of gait and mobility: Secondary | ICD-10-CM | POA: Diagnosis not present

## 2015-03-25 ENCOUNTER — Encounter: Payer: Self-pay | Admitting: Physical Therapy

## 2015-03-25 NOTE — Therapy (Signed)
[facility] [address] [place], [place], [address] Phone: [phone]   Fax:  [phone]  Physical Therapy Treatment  Patient Details  Name: Audrey Obrien MRN: [id number] Date of Birth: 01/17/1931 Referring Provider:  Dorian Heckle, MD  Encounter Date: 03/24/2015    Past Medical History  Diagnosis Date  . Hypertension   . Acne rosacea   . Chronic rhinitis     no benefit from antihistamines  . Gallbladder & bile duct stone 2007    1.6 cm GB stone; no symptoms  . Pneumonia 2007  . 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 C. difficile colitis 11/07  . Spastic dysphonia     botox injs at [facility]  . Hypertension   . Scleroderma     probable, ANA+ 1:160, anticentromers strongly pos, USRNP-, TH/TO-,RF equivocal, CCP-, Skin puffiness of hands, telangectasias of hands, raynauds, ECHO increased pulmonary pressures 8/11, pfts stable  . S/P right heart catheterization 10/11  . Benign essential tremor   . Osteoporosis     started alendronate 6/12  . Glaucoma   . Spastic dysphonia     Get Botox at [facility]  . Benign essential tremor   . Dementia   . Raynaud disease   . Blindness     per husband, both eyes    Past Surgical History  Procedure Laterality Date  . Lung surgery  1982  . Corneal transplant      There were no vitals filed for this visit.  Visit Diagnosis:  No diagnosis found.      Subjective Assessment - 03/25/15 1429    Subjective Pt. 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  No/denies                         [facility] Adult PT Treatment/Exercise - 03/25/15 0001    Transfers   Transfers Sit to Stand   Sit to Stand 4: Min guard   Ambulation/Gait   Ambulation/Gait Yes   Ambulation/Gait Assistance 4: Min guard   Ambulation Distance (Feet) 120 Feet   Assistive device Other (Comment)  hand held assist   Gait Pattern Decreased stride length;Decreased step length - left;Decreased step length - right   Ambulation Surface Level;Indoor   Knee/Hip Exercises: Standing   Heel Raises 1 set;10 reps   Other Standing Knee Exercises standing hip flexion, extension and abduction with 3# weight on each leg x 10 reps each     Neuro Re-ed:  rockerboard x 1" anteriorly and posteriorly; alternate tap ups to 6" step with bil. UE support  Pulse oximeter used to record oxygen at 96 % and resting HR 110-114 ; pt. Exhibiting some dyspnea with activity -  Recommended husband check HR at home with their unit and contact MD if pt. Continued with SOB or incr. HR -  he verbalized understanding and agreement             PT Short Term Goals - 03/21/15 1041    PT SHORT TERM GOAL #  1   Title Pt.'s husband will report at least 25% improvement in sit to stand transfers at home.   Baseline inconsistent per husband's report -- 03-20-15   Status On-going   PT SHORT TERM GOAL #2   Title Amb. 250' with min hand held assist on flat even surface.   Baseline met 03-17-15   Status Achieved   PT SHORT TERM GOAL #3   Title Perform HEP with husband's assist   Baseline ongoing  03-20-15   Status On-going   PT SHORT TERM GOAL #4   Title Perform bed mobility supine to sit with CGA.   Baseline met in clinic but husband reports inconsistent performance at home 03-20-15   Status On-going           PT Long Term Goals - 02/11/15 2101    PT LONG TERM GOAL #1   Title Perform sit to stand transfer with CGA.   Baseline target date 04-12-15   Time 8   Period Weeks   Status New   PT  LONG TERM GOAL #2   Title Amb. 500' with CGA  on flat even surface for incr. community accessibility.   Baseline target date 04-12-15   Time 8   Period Weeks   Status New   PT LONG TERM GOAL #3   Title Perform bed mobility including rolling and sit to/from supine with CGA.   Baseline target date 04-12-15   Time 8   Period Weeks   Status New   PT LONG TERM GOAL #4   Title Stand for at least 5" with UE support on counter prn with CGA (per husband's report) for incr. independence with ADL's   Baseline target date 04-12-15   Time 8   Period Weeks   Status New               Problem List Patient Active Problem List   Diagnosis Date Noted  . MAIC (mycobacterium avium-intracellulare complex) 04/16/2012  . Physical deconditioning 04/05/2012  . Dilantin toxicity 03/31/2012  . Cystitis 03/28/2012  . Hyponatremia 03/28/2012  . Dehydration 03/28/2012  . HTN (hypertension) 03/28/2012  . Seizure disorder 03/28/2012  . Scleroderma 03/28/2012  . Dysphagia 03/28/2012  . Weakness generalized 03/28/2012  . Peripheral edema 03/28/2012  . Chronic cough 09/15/2011  . Bronchiectasis without acute exacerbation 09/15/2011  . Chronic allergic rhinitis 09/15/2011  . GERD (gastroesophageal reflux disease) 09/15/2011    Koven Belinsky, [place], PT 03/25/2015, 2:36 PM  [facility] [address] [place], [place], [address] Phone: [phone]   Fax:  [phone]

## 2015-03-27 ENCOUNTER — Ambulatory Visit: Payer: Medicare Other | Admitting: Physical Therapy

## 2015-03-27 DIAGNOSIS — R269 Unspecified abnormalities of gait and mobility: Secondary | ICD-10-CM | POA: Diagnosis not present

## 2015-03-28 ENCOUNTER — Encounter: Payer: Self-pay | Admitting: Physical Therapy

## 2015-03-28 NOTE — Therapy (Signed)
[facility] [address] [place], [place], [address] Phone: [phone]   Fax:  [phone]  Physical Therapy Treatment  Patient Details  Name: Audrey Obrien MRN: [id number] Date of Birth: 1931/02/17 Referring Provider:  Dorian Heckle, MD  Encounter Date: 03/27/2015      PT End of Session - 03/28/15 1340    Visit Number 8  G8   Number of Visits 17   Date for PT Re-Evaluation 04/12/15   Authorization Type Medicare/Tricare   PT Start Time 1451   PT Stop Time 1537   PT Time Calculation (min) 46 min      Past Medical History  Diagnosis Date  . Hypertension   . Acne rosacea   . Chronic rhinitis     no benefit from antihistamines  . Gallbladder & bile duct stone 2007    1.6 cm GB stone; no symptoms  . Pneumonia 2007  . 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 C. difficile colitis 11/07  . Spastic dysphonia     botox injs at [facility]  . Hypertension   . Scleroderma     probable, ANA+ 1:160, anticentromers strongly pos, USRNP-, TH/TO-,RF equivocal, CCP-, Skin puffiness of hands, telangectasias of hands, raynauds, ECHO increased pulmonary pressures 8/11, pfts stable  . S/P right heart catheterization 10/11  . Benign essential tremor   . Osteoporosis     started alendronate 6/12  . Glaucoma   . Spastic dysphonia     Get Botox at [facility]  . Benign essential tremor   . Dementia   . Raynaud disease   . Blindness     per husband, both eyes    Past Surgical History  Procedure Laterality Date  . Lung surgery  1982  . Corneal transplant      There were no vitals filed for this visit.  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 No/denies                         [facility] Adult PT Treatment/Exercise - 03/28/15 0001    Transfers   Transfers Sit to Stand   Sit to Stand 4: Min guard   Ambulation/Gait   Ambulation/Gait Yes   Ambulation/Gait Assistance 4: Min guard   Ambulation Distance (Feet) 240 Feet   Assistive device Rollator   Gait Pattern Decreased step length - right;Decreased step length - left;Step-to pattern  due to lack of vision   Ambulation Surface Level;Indoor   Knee/Hip Exercises: Standing   Heel Raises 1 set;10 reps   Other Standing Knee Exercises standing hip flexion, extension and abduction with 3# weight on each leg x 10 reps each             Balance Exercises - 03/28/15 1339    Balance Exercises: Standing   Rockerboard Anterior/posterior;EO;30 seconds  for 2" with UE  support   Balance Exercises: Seated   Heel Raises 10 reps   Toe Raise 10 reps             PT Short Term Goals - 03/21/15 1041    PT SHORT TERM GOAL #1   Title Pt.'s husband will report at least 25% improvement in sit to stand transfers at home.   Baseline inconsistent per husband's report -- 03-20-15   Status On-going   PT SHORT TERM GOAL #2   Title Amb. 250' with min hand held assist on flat even surface.   Baseline met 03-17-15   Status Achieved   PT SHORT TERM GOAL #3   Title Perform HEP with husband's assist   Baseline ongoing  03-20-15   Status On-going   PT SHORT TERM GOAL #4   Title Perform bed mobility supine to sit with CGA.   Baseline met in clinic but husband reports inconsistent performance at home 03-20-15   Status On-going           PT Long Term Goals - 02/11/15 2101    PT LONG TERM GOAL  #1   Title Perform sit to stand transfer with CGA.   Baseline target date 04-12-15   Time 8   Period Weeks   Status New   PT LONG TERM GOAL #2   Title Amb. 500' with CGA  on flat even surface for incr. community accessibility.   Baseline target date 04-12-15   Time 8   Period Weeks   Status New   PT LONG TERM GOAL #3   Title Perform bed mobility including rolling and sit to/from supine with CGA.   Baseline target date 04-12-15   Time 8   Period Weeks   Status New   PT LONG TERM GOAL #4   Title Stand for at least 5" with UE support on counter prn with CGA (per husband's report) for incr. independence with ADL's   Baseline target date 04-12-15   Time 8   Period Weeks   Status New               Plan - 03/28/15 1341    Clinical Impression Statement Rollator decreases right trunk lean by providing bil. UE support during gait but step length remains decreased due to lack of vision resulting in uncertainty with steps   Pt will benefit from skilled therapeutic intervention in order to improve on the following deficits Abnormal gait;Decreased coordination;Decreased endurance;Decreased balance;Impaired vision/preception;Decreased cognition;Decreased mobility;Decreased strength   Rehab Potential Good   PT Frequency 2x / week   PT Duration 8 weeks   PT Treatment/Interventions ADLs/Self Care Home Management;Therapeutic activities;Patient/family education;Therapeutic exercise;Gait training;Balance training;Stair training;Neuromuscular re-education;Functional mobility training   PT Next Visit Plan balance and gait   PT Home Exercise Plan see above; discussed need to walk more with assistance - either outside at home or going to gym/recreation center -husband agrees   Consulted and Agree with Plan of Care Patient;Family member/caregiver   Family Member Consulted spouse        Problem List Patient Active Problem List   Diagnosis Date Noted  . MAIC (mycobacterium avium-intracellulare  complex) 04/16/2012  . Physical deconditioning 04/05/2012  . Dilantin toxicity 03/31/2012  . Cystitis 03/28/2012  . Hyponatremia 03/28/2012  . Dehydration 03/28/2012  . HTN (hypertension) 03/28/2012  . Seizure disorder 03/28/2012  . Scleroderma 03/28/2012  . Dysphagia 03/28/2012  . Weakness generalized 03/28/2012  . Peripheral edema 03/28/2012  . Chronic cough 09/15/2011  . Bronchiectasis without  acute exacerbation 09/15/2011  . Chronic allergic rhinitis 09/15/2011  . GERD (gastroesophageal reflux disease) 09/15/2011    Kayloni Rocco, [place], PT 03/28/2015, 1:45 PM  [facility] [address] [place], [place], [address] Phone: [phone]   Fax:  [phone]

## 2015-03-31 ENCOUNTER — Ambulatory Visit: Payer: Medicare Other | Admitting: Physical Therapy

## 2015-04-03 ENCOUNTER — Ambulatory Visit: Payer: Medicare Other | Admitting: Physical Therapy

## 2015-04-07 ENCOUNTER — Ambulatory Visit: Payer: Medicare Other | Attending: Internal Medicine | Admitting: Physical Therapy

## 2015-04-07 DIAGNOSIS — F0280 Dementia in other diseases classified elsewhere without behavioral disturbance: Secondary | ICD-10-CM | POA: Insufficient documentation

## 2015-04-07 DIAGNOSIS — G309 Alzheimer's disease, unspecified: Secondary | ICD-10-CM | POA: Insufficient documentation

## 2015-04-07 DIAGNOSIS — R262 Difficulty in walking, not elsewhere classified: Secondary | ICD-10-CM | POA: Insufficient documentation

## 2015-04-07 DIAGNOSIS — R269 Unspecified abnormalities of gait and mobility: Secondary | ICD-10-CM | POA: Insufficient documentation

## 2015-04-07 DIAGNOSIS — I10 Essential (primary) hypertension: Secondary | ICD-10-CM | POA: Diagnosis not present

## 2015-04-07 DIAGNOSIS — M810 Age-related osteoporosis without current pathological fracture: Secondary | ICD-10-CM | POA: Insufficient documentation

## 2015-04-08 ENCOUNTER — Encounter: Payer: Self-pay | Admitting: Physical Therapy

## 2015-04-08 NOTE — Therapy (Signed)
[facility] [address] [place], [place], [address] Phone: [phone]   Fax:  [phone]  Physical Therapy Treatment  Patient Details  Name: Audrey Obrien MRN: [id number] Date of Birth: [birth year] Referring Provider:  Dorian Heckle, MD  Encounter Date: 04/07/2015      PT End of Session - 04/08/15 1305    Visit Number 9  G9   Number of Visits 17   Date for PT Re-Evaluation 04/12/15   Authorization Type Medicare/Tricare   PT Start Time 1449   PT Stop Time 1535   PT Time Calculation (min) 46 min      Past Medical History  Diagnosis Date  . Hypertension   . Acne rosacea   . Chronic rhinitis     no benefit from antihistamines  . Gallbladder & bile duct stone 2007    1.6 cm GB stone; no symptoms  . Pneumonia 2007  . 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 C. difficile colitis 11/07  . Spastic dysphonia     botox injs at [facility]  . Hypertension   . Scleroderma     probable, ANA+ 1:160, anticentromers strongly pos, USRNP-, TH/TO-,RF equivocal, CCP-, Skin puffiness of hands, telangectasias of hands, raynauds, ECHO increased pulmonary pressures 8/11, pfts stable  . S/P right heart catheterization 10/11  . Benign essential tremor   . Osteoporosis     started alendronate 6/12  . Glaucoma   . Spastic dysphonia     Get Botox at [facility]  . Benign essential tremor   . Dementia   . Raynaud disease   . Blindness     per husband, both eyes    Past Surgical History  Procedure Laterality Date  . Lung surgery  1982  . Corneal transplant      There were no vitals filed for this visit.  Visit Diagnosis:  Abnormality of gait      Subjective Assessment - 04/08/15 1257    Subjective No c/o reported - no falls- husband reports no changes; asks if the 2 visits missed last week can be made up   Patient is accompained by: Family member   Pertinent History Alzheimer's; cornea transplant Feb.  2013; pt is blind; Dilantin overdose - seizures   Patient Stated Goals Improve balance - improve ability to perform sit to stand transfer; improve walking; improve ability to transfer from supine to sitting position   Currently in Pain? No/denies                         [facility] Adult PT Treatment/Exercise - 04/08/15 0001    Transfers   Transfers Sit to Stand   Sit to Stand 4: Min guard   Ambulation/Gait   Ambulation/Gait Yes   Ambulation/Gait Assistance 4: Min guard   Ambulation Distance (Feet) 240 Feet   Assistive device Rollator   Gait Pattern Decreased step length - right;Decreased step length - left;Step-to pattern  due to lack of vision   Ambulation Surface Level;Indoor   Knee/Hip Exercises: Standing   Rocker Board 1 minute   Other Standing Knee Exercises standing hip flexion, extension and abduction with 3# weight on each leg x 10 reps each    TherEx; heel raises x 10 reps;   NeuroRe-ed: sit to stand x 5 reps from mat to RW with UE support x 5 reps; Rockerboard with UE support x 1" inside Bars with CGA;  sidestepping inside bars with UE support 10' x 4 reps with CGA            PT Short Term Goals - 03/21/15 1041    PT SHORT TERM GOAL #1   Title Pt.'s husband will report at least 25% improvement in sit to stand transfers at home.   Baseline inconsistent per husband's report -- 03-20-15   Status On-going   PT SHORT TERM GOAL #2   Title Amb. 250' with min hand held assist on flat even surface.   Baseline met 03-17-15   Status Achieved   PT SHORT TERM GOAL #3   Title Perform HEP with husband's assist   Baseline ongoing  03-20-15   Status On-going   PT SHORT TERM GOAL #4   Title Perform bed mobility supine to sit with CGA.   Baseline met in clinic but husband reports inconsistent performance at home 03-20-15   Status On-going           PT Long Term Goals - 02/11/15 2101    PT LONG TERM GOAL #1   Title Perform sit to stand transfer with CGA.    Baseline target date 04-12-15   Time 8   Period Weeks   Status New   PT LONG TERM GOAL #2   Title Amb. 500' with CGA  on flat even surface for incr. community accessibility.   Baseline target date 04-12-15   Time 8   Period Weeks   Status New   PT LONG TERM GOAL #3   Title Perform bed mobility including rolling and sit to/from supine with CGA.   Baseline target date 04-12-15   Time 8   Period Weeks   Status New   PT LONG TERM GOAL #4   Title Stand for at least 5" with UE support on counter prn with CGA (per husband's report) for incr. independence with ADL's   Baseline target date 04-12-15   Time 8   Period Weeks   Status New               Plan - 04/08/15 1307    Clinical Impression Statement Pt.'s gait improved with use of rollator; step length remains decreased due to lack of vision. Pt. able to stand more upright on initial sit to stand transfer    Pt will benefit from skilled therapeutic intervention in order to improve on the following deficits Abnormal gait;Decreased coordination;Decreased endurance;Decreased balance;Impaired vision/preception;Decreased cognition;Decreased mobility;Decreased strength   Rehab Potential Good   PT Frequency 2x / week   PT Duration 8 weeks   PT Treatment/Interventions ADLs/Self Care Home Management;Therapeutic activities;Patient/family education;Therapeutic exercise;Gait training;Balance training;Stair training;Neuromuscular re-education;Functional mobility training   PT Next Visit Plan balance and gait   PT Home Exercise Plan see above; discussed need to walk more with assistance - either outside at home or going to gym/recreation center -husband agrees   Consulted and Agree with Plan of Care Patient;Family member/caregiver   Family Member Consulted spouse        Problem List Patient Active Problem List   Diagnosis Date Noted  . MAIC (mycobacterium avium-intracellulare complex) 04/16/2012  . Physical deconditioning 04/05/2012  .  Dilantin toxicity 03/31/2012  . Cystitis 03/28/2012  . Hyponatremia 03/28/2012  . Dehydration 03/28/2012  . HTN (hypertension) 03/28/2012  . Seizure disorder 03/28/2012  . Scleroderma 03/28/2012  . Dysphagia 03/28/2012  . Weakness generalized 03/28/2012  . Peripheral edema 03/28/2012  . Chronic cough 09/15/2011  . Bronchiectasis without acute exacerbation 09/15/2011  .  Chronic allergic rhinitis 09/15/2011  . GERD (gastroesophageal reflux disease) 09/15/2011    Avereigh Spainhower, [place], PT 04/08/2015, 2:18 PM  [facility] [address] [place], [place], [address] Phone: [phone]   Fax:  [phone]

## 2015-04-10 ENCOUNTER — Ambulatory Visit: Payer: Medicare Other | Admitting: Physical Therapy

## 2015-04-10 DIAGNOSIS — R269 Unspecified abnormalities of gait and mobility: Secondary | ICD-10-CM | POA: Diagnosis not present

## 2015-04-11 ENCOUNTER — Encounter: Payer: Self-pay | Admitting: Physical Therapy

## 2015-04-11 NOTE — Therapy (Signed)
[facility] [address] [place], [place], [address] Phone: [phone]   Fax:  [phone]  Physical Therapy Treatment  Patient Details  Name: Audrey Obrien MRN: [id number] Date of Birth: [birth year] Referring Provider:  Dorian Heckle, MD  Encounter Date: 04/10/2015      PT End of Session - 04/11/15 1022    Visit Number 10  G10   Number of Visits 17   Date for PT Re-Evaluation 04/12/15   Authorization Type Medicare/Tricare   PT Start Time 1446   PT Stop Time 1533   PT Time Calculation (min) 47 min      Past Medical History  Diagnosis Date  . Hypertension   . Acne rosacea   . Chronic rhinitis     no benefit from antihistamines  . Gallbladder & bile duct stone 2007    1.6 cm GB stone; no symptoms  . Pneumonia 2007  . 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 C. difficile colitis 11/07  . Spastic dysphonia     botox injs at [facility]  . Hypertension   . Scleroderma     probable, ANA+ 1:160, anticentromers strongly pos, USRNP-, TH/TO-,RF equivocal, CCP-, Skin puffiness of hands, telangectasias of hands, raynauds, ECHO increased pulmonary pressures 8/11, pfts stable  . S/P right heart catheterization 10/11  . Benign essential tremor   . Osteoporosis     started alendronate 6/12  . Glaucoma   . Spastic dysphonia     Get Botox at [facility]  . Benign essential tremor   . Dementia   . Raynaud disease   . Blindness     per husband, both eyes    Past Surgical History  Procedure Laterality Date  . Lung surgery  1982  . Corneal transplant      There were no vitals filed for this visit.  Visit Diagnosis:  Abnormality of gait      Subjective Assessment - 04/11/15 1019    Subjective No new c/o's - husband reports some improvement in sit to stand transfers at home   Patient is accompained by: Family member   Pertinent History Alzheimer's; cornea transplant Feb. 2013; pt is blind;  Dilantin overdose - seizures   Patient Stated Goals Improve balance - improve ability to perform sit to stand transfer; improve walking; improve ability to transfer from supine to sitting position   Currently in Pain? No/denies                         [facility] Adult PT Treatment/Exercise - 04/11/15 0001    Bed Mobility   Supine to Sit 4: Min guard   Sit to Supine 5: Supervision   Transfers   Transfers Sit to Stand   Sit to Stand 4: Min guard   Ambulation/Gait   Ambulation/Gait Yes   Ambulation/Gait Assistance 4: Min guard   Ambulation Distance (Feet) 240 Feet   Assistive device Rollator   Gait Pattern Decreased step length - right;Decreased step length - left;Step-to pattern  due to lack of vision   Ambulation Surface Level;Indoor   Knee/Hip Exercises: Aerobic   Stationary Bike Nustep level 3 x 4" with UE's and LE's   Knee/Hip Exercises: Standing   Heel Raises 1 set;10 reps   Rocker Board 1 minute   Other Standing Knee Exercises standing hip flexion, extension and abduction with 3# weight on each leg x 10 reps each  PT Short Term Goals - 03/21/15 1041    PT SHORT TERM GOAL #1   Title Pt.'s husband will report at least 25% improvement in sit to stand transfers at home.   Baseline inconsistent per husband's report -- 03-20-15   Status On-going   PT SHORT TERM GOAL #2   Title Amb. 250' with min hand held assist on flat even surface.   Baseline met 03-17-15   Status Achieved   PT SHORT TERM GOAL #3   Title Perform HEP with husband's assist   Baseline ongoing  03-20-15   Status On-going   PT SHORT TERM GOAL #4   Title Perform bed mobility supine to sit with CGA.   Baseline met in clinic but husband reports inconsistent performance at home 03-20-15   Status On-going           PT Long Term Goals - 04/11/15 1025    PT LONG TERM GOAL #1   Title Perform sit to stand transfer with CGA.   Baseline target date 04-12-15   Status On-going    PT LONG TERM GOAL #2   Title Amb. 500' with CGA  on flat even surface for incr. community accessibility.   Baseline target date 04-12-15   Status On-going   PT LONG TERM GOAL #3   Title Perform bed mobility including rolling and sit to/from supine with CGA.   Baseline target date 04-12-15   Status On-going   PT LONG TERM GOAL #4   Title Stand for at least 5" with UE support on counter prn with CGA (per husband's report) for incr. independence with ADL's   Baseline target date 04-12-15   Status On-going               Plan - 04/11/15 1023    Clinical Impression Statement Pt. is progressing towards LTG's - goals extended due to pt. missing last week - plan D/C after 2 more visiits; mobility limited by lack of vision   Pt will benefit from skilled therapeutic intervention in order to improve on the following deficits Abnormal gait;Decreased coordination;Decreased endurance;Decreased balance;Impaired vision/preception;Decreased cognition;Decreased mobility;Decreased strength   Rehab Potential Good   PT Frequency 2x / week   PT Duration 2 weeks   PT Treatment/Interventions ADLs/Self Care Home Management;Therapeutic activities;Patient/family education;Therapeutic exercise;Gait training;Balance training;Stair training;Neuromuscular re-education;Functional mobility training   PT Next Visit Plan begin chiecking LTG's and plan d/c after 2 more sessions   PT Home Exercise Plan see above; discussed need to walk more with assistance - either outside at home or going to gym/recreation center -husband agrees   Consulted and Agree with Plan of Care Patient;Family member/caregiver   Family Member Consulted spouse          G-Codes - 04/15/2015 1026    Functional Assessment Tool Used clinical judgment   Functional Limitation Mobility: Walking and moving around   Mobility: Walking and Moving Around Current Status [phone]) At least 40 percent but less than 60 percent impaired, limited or restricted    Mobility: Walking and Moving Around Goal Status [phone]) At least 40 percent but less than 60 percent impaired, limited or restricted      Problem List Patient Active Problem List   Diagnosis Date Noted  . MAIC (mycobacterium avium-intracellulare complex) 04/16/2012  . Physical deconditioning 04/05/2012  . Dilantin toxicity 03/31/2012  . Cystitis 03/28/2012  . Hyponatremia 03/28/2012  . Dehydration 03/28/2012  . HTN (hypertension) 03/28/2012  . Seizure disorder 03/28/2012  . Scleroderma 03/28/2012  . Dysphagia  03/28/2012  . Weakness generalized 03/28/2012  . Peripheral edema 03/28/2012  . Chronic cough 09/15/2011  . Bronchiectasis without acute exacerbation 09/15/2011  . Chronic allergic rhinitis 09/15/2011  . GERD (gastroesophageal reflux disease) 09/15/2011    Amaria Mundorf, [place], PT 04/11/2015, 10:28 AM  [facility] [address] [place], [place], [address] Phone: [phone]   Fax:  [phone]

## 2015-05-06 ENCOUNTER — Other Ambulatory Visit: Payer: Self-pay

## 2015-05-06 DIAGNOSIS — Z1231 Encounter for screening mammogram for malignant neoplasm of breast: Secondary | ICD-10-CM

## 2015-06-03 ENCOUNTER — Ambulatory Visit
Admission: RE | Admit: 2015-06-03 | Discharge: 2015-06-03 | Disposition: A | Discharge status: Home / Self Care | Payer: Medicare Other | Source: Ambulatory Visit

## 2015-06-03 DIAGNOSIS — Z1231 Encounter for screening mammogram for malignant neoplasm of breast: Secondary | ICD-10-CM

## 2015-07-15 ENCOUNTER — Encounter: Payer: Self-pay | Admitting: Emergency Medicine

## 2015-07-15 ENCOUNTER — Ambulatory Visit (INDEPENDENT_AMBULATORY_CARE_PROVIDER_SITE_OTHER): Payer: Medicare Other | Admitting: Emergency Medicine

## 2015-07-15 VITALS — BP 128/70 | HR 97 | Ht 64.0 in | Wt 111.6 lb

## 2015-07-15 DIAGNOSIS — R05 Cough: Secondary | ICD-10-CM | POA: Diagnosis not present

## 2015-07-15 DIAGNOSIS — R053 Chronic cough: Secondary | ICD-10-CM

## 2015-07-15 MED ORDER — HYDROCODONE-HOMATROPINE 5-1.5 MG/5ML PO SYRP: 5.0000 mL | ORAL_SOLUTION | Freq: Four times a day (QID) | ORAL | Status: DC | PRN

## 2015-07-15 NOTE — Patient Instructions (Signed)
Please continue your chlorpheniramine as you have been taking it Start using guaifenesin 600 mg twice a day Start loratadine 10 mg once a day Try using Hycodan 5 mL up to every 6 hours if needed for cough We will not perform any imaging or testing right now but if the cough continues we may need to evaluate further for chronic infection (like the microbacterial disease that we have treated in the past) Follow with Dr Delton Coombes in 1 month or next available

## 2015-07-15 NOTE — Assessment & Plan Note (Signed)
Please continue your chlorpheniramine as you have been taking it Start using guaifenesin 600 mg twice a day Start loratadine 10 mg once a day Try using Hycodan 5 mL up to every 6 hours if needed for cough We will not perform any imaging or testing right now but if the cough continues we may need to evaluate further for chronic infection (like the microbacterial disease that we have treated in the past) Swallowing precautions Follow with Dr Delton Coombes in 1 month or next available

## 2015-07-15 NOTE — Progress Notes (Signed)
Subjective:    Patient ID: Audrey Obrien, female    DOB: 26-Nov-1931, 81 y.o.   MRN: [id number]  HPI 81 yo never smoker w hx scleroderma, HTN, GERD, chronic cough (2-3 yrs). 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 I saw her in the hospital 9/27 for cough and hemoptysis. CT scan of the chest 9/27 showed nodular parenchymal opacity within the RUL with a  'tree in bud ' appearance, progressed compared with 06/2011. FOB done 9/28 showed old blood in the RUL posterior segment, BAL has been smear negative for AFB and fungal (final cx pending). The bacterial cx grew sensitive pseudomonas. Cytology on BAL negative. She has nasal gtt and a lot of throat mucous. Has tried nasonex before, not sure it helped. Not currently on anything. Started mucinex a week ago.   ROV 10/12/11 -- scleroderma, chronic cough, bronchiectasis. BAL done 9/28 = MAIC. Started on ethambutol, rifampin, clarithromycin on 09/22/11. She has tolerated the antibiotics, has had a single episode diarrhea, has noticed a rash on her legs. Remains on the loratadine and fluticasone.  She believes that her mucous production is improved.   ROV 10/21/11 - scleroderma, chronic cough, bronchiectasis. Dx with MAIC on BAL. Started 3 drug rx but she developed rash, had to stop all 3 meds. Returns to discuss next plans. She says the rash is better, has low appetite.   ROV 11/22/11 -- scleroderma, chronic cough, bronchiectasis. Dx with MAIC on BAL. Attempted to start clarithro alone (due to rash on 3 drug rx), but we had to stop it due to lack of appetite, nausea. Stopped it 3 days ago.  ROV 05/22/12 -- scleroderma, chronic cough, bronchiectasis. Dx with MAIC on BAL 09/03/11. Was unable to tolerate 3 drug therapy as above. Repeaqt CT scan 6/12 shows little change, maybe some possible improvement in tree-bud pattern, a more prominent L sided nodule, likely inflammatory.  She restarted clarithro in  January, then added ethambutol 2 weeks later - but no longer taking. Has been on the clarithro for 5 months.  Her cough is dry, more rare than last visit.   OV 09/04/2012 Acute OV for cough, mucus, sounded like PND, upper airway cough  ROV 12/27/12 -- scleroderma, chronic cough, PND, bronchiectasis. Has been on clarithro + ethambutol x 1 year for [facility]. Repeat CT scan 12/08/12 -- some mild progression of micronodular disease as below. She reports that since last time she has lost her sight, her functional capacity has significantly declined  - dementia, scleroderma, dysphagia, blindness. She has some occasional cough, less congestion.   ROV 06/21/13 -- scleroderma, chronic cough, PND, bronchiectasis. We are no longer on abx for [facility]. She is doing well. Cough is better on chlorpheniramine bid.  Uses albuterol prn - rarely.   ROV 12/20/13 -- scleroderma, chronic cough, PND, bronchiectasis. Finished clarithro + ethambutol x 1 year for [facility]. She has been on chlorpheniramine, cough stable. She has dry mouth, minimal PO water intake. She has been doing much better since last visit. She was receiving hospice care but now of of it. Chronic keflex for recurrent UTI's     ROV 07/15/15 -- history of scleroderma; follow-up visit for chronic cough in the setting of bronchiectasis and prior mycobacterial disease. She was treated for a year with clarithromycin and ethambutol. She is at some risk for aspiration due to her scleroderma and she practices aspiration precautions when eating and drinking. Her cough has flared over the last  month, has been associated with an increase in nasal drainage. No recent evidence for aspiration. Takes chlorpheniramine tid.     Objective:   Physical Exam Filed Vitals:   07/15/15 1613  BP: 128/70  Pulse: 97  Height:  (1.626 m)  Weight: 111 lb 9.6 oz (50.621 kg)  SpO2: 93%   Gen: Pleasant, ill appearing in wheelchair, in no distress, some tremor, ? parkinsonian affect  ENT: No  lesions,  mouth clear,  oropharynx clear, gravel voice quality, some postnasal drip  Neck: No JVD, no TMG, no carotid bruits  Lungs: No use of accessory muscles, no dullness to percussion, clear without rales or rhonchi  Cardiovascular: RRR, heart sounds normal, no murmur or gallops, no peripheral edema  Musculoskeletal: No deformities, no cyanosis or clubbing  Neuro: alert, non focal, slightly slow to respond, mild tremor.   Skin: Warm, no lesions or rashes   CT scan 12/08/12 --  Comparison: 05/03/2012, 09/02/2011 and 07/06/2011.  Findings: Mediastinal lymph nodes measure up to 1.5 cm in the  precarinal station, stable. Hilar regions are difficult to  definitively evaluate without IV contrast. No axillary adenopathy.  Pulmonary arteries are borderline enlarged. Coronary artery  calcification. Heart is at the upper limits of normal in size.  Small amount of pericardial fluid is likely physiologic.  Biapical pleural parenchymal scarring. Peribronchovascular  nodularity and scattered air space consolidation have progressed,  especially in the right middle and right lower lobes. Minimal  associated bronchiectasis. Probable mucoid impaction in the  anterior left upper lobe (image 24), unchanged. No pleural fluid.  Airway is unremarkable.  Incidental imaging of the upper abdomen shows no acute findings.  No worrisome lytic or sclerotic lesions.  IMPRESSION:  1. Progressive peribronchovascular nodularity and airspace  consolidation in the right lung, favoring an ongoing  atypical/mycobacterial infectious process.  2. Mediastinal lymph nodes may be reactive and are stable.      Assessment & Plan:  Chronic cough Please continue your chlorpheniramine as you have been taking it Start using guaifenesin 600 mg twice a day Start loratadine 10 mg once a day Try using Hycodan 5 mL up to every 6 hours if needed for cough We will not perform any imaging or testing right now but if the cough  continues we may need to evaluate further for chronic infection (like the microbacterial disease that we have treated in the past) Swallowing precautions Follow with Dr Delton Coombes in 1 month or next available

## 2015-08-19 ENCOUNTER — Ambulatory Visit (INDEPENDENT_AMBULATORY_CARE_PROVIDER_SITE_OTHER): Payer: Medicare Other | Admitting: Emergency Medicine

## 2015-08-19 ENCOUNTER — Encounter: Payer: Self-pay | Admitting: Emergency Medicine

## 2015-08-19 VITALS — BP 126/60 | HR 84 | Wt <= 1120 oz

## 2015-08-19 DIAGNOSIS — J309 Allergic rhinitis, unspecified: Secondary | ICD-10-CM | POA: Diagnosis not present

## 2015-08-19 DIAGNOSIS — A310 Pulmonary mycobacterial infection: Secondary | ICD-10-CM

## 2015-08-19 DIAGNOSIS — M349 Systemic sclerosis, unspecified: Secondary | ICD-10-CM

## 2015-08-19 MED ORDER — HYDROCODONE-HOMATROPINE 5-1.5 MG/5ML PO SYRP: 5.0000 mL | ORAL_SOLUTION | Freq: Four times a day (QID) | ORAL | Status: DC | PRN

## 2015-08-19 NOTE — Assessment & Plan Note (Signed)
Treated. I don't believe we need to pursue repeat CT scan at this time given her improved cough with treatment of his allergies

## 2015-08-19 NOTE — Assessment & Plan Note (Signed)
Swallowing precautions discussed

## 2015-08-19 NOTE — Assessment & Plan Note (Signed)
We will continue your chlorpheniramine  in the morning  Take loratadine  around 5pm every day Please use Hycodan as needed for cough Follow with Dr Delton Coombes in 4 months or sooner if you have any problems.

## 2015-08-19 NOTE — Patient Instructions (Signed)
We will continue your chlorpheniramine  in the morning  Take loratadine  around 5pm every day Please use Hycodan as needed for cough We will not repeat your Ct scan of the chest right now Follow with Dr Delton Coombes in 4 months or sooner if you have any problems.

## 2015-08-19 NOTE — Progress Notes (Signed)
Subjective:    Patient ID: Audrey Obrien, female    DOB: 26-Nov-1931, 81 y.o.   MRN: [id number]  HPI 81 yo never smoker w hx scleroderma, HTN, GERD, chronic cough (2-3 yrs). 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 I saw her in the hospital 9/27 for cough and hemoptysis. CT scan of the chest 9/27 showed nodular parenchymal opacity within the RUL with a  'tree in bud ' appearance, progressed compared with 06/2011. FOB done 9/28 showed old blood in the RUL posterior segment, BAL has been smear negative for AFB and fungal (final cx pending). The bacterial cx grew sensitive pseudomonas. Cytology on BAL negative. She has nasal gtt and a lot of throat mucous. Has tried nasonex before, not sure it helped. Not currently on anything. Started mucinex a week ago.   ROV 10/12/11 -- scleroderma, chronic cough, bronchiectasis. BAL done 9/28 = MAIC. Started on ethambutol, rifampin, clarithromycin on 09/22/11. She has tolerated the antibiotics, has had a single episode diarrhea, has noticed a rash on her legs. Remains on the loratadine and fluticasone.  She believes that her mucous production is improved.   ROV 10/21/11 - scleroderma, chronic cough, bronchiectasis. Dx with MAIC on BAL. Started 3 drug rx but she developed rash, had to stop all 3 meds. Returns to discuss next plans. She says the rash is better, has low appetite.   ROV 11/22/11 -- scleroderma, chronic cough, bronchiectasis. Dx with MAIC on BAL. Attempted to start clarithro alone (due to rash on 3 drug rx), but we had to stop it due to lack of appetite, nausea. Stopped it 3 days ago.  ROV 05/22/12 -- scleroderma, chronic cough, bronchiectasis. Dx with MAIC on BAL 09/03/11. Was unable to tolerate 3 drug therapy as above. Repeaqt CT scan 6/12 shows little change, maybe some possible improvement in tree-bud pattern, a more prominent L sided nodule, likely inflammatory.  She restarted clarithro in  January, then added ethambutol 2 weeks later - but no longer taking. Has been on the clarithro for 5 months.  Her cough is dry, more rare than last visit.   OV 09/04/2012 Acute OV for cough, mucus, sounded like PND, upper airway cough  ROV 12/27/12 -- scleroderma, chronic cough, PND, bronchiectasis. Has been on clarithro + ethambutol x 1 year for [facility]. Repeat CT scan 12/08/12 -- some mild progression of micronodular disease as below. She reports that since last time she has lost her sight, her functional capacity has significantly declined  - dementia, scleroderma, dysphagia, blindness. She has some occasional cough, less congestion.   ROV 06/21/13 -- scleroderma, chronic cough, PND, bronchiectasis. We are no longer on abx for [facility]. She is doing well. Cough is better on chlorpheniramine bid.  Uses albuterol prn - rarely.   ROV 12/20/13 -- scleroderma, chronic cough, PND, bronchiectasis. Finished clarithro + ethambutol x 1 year for [facility]. She has been on chlorpheniramine, cough stable. She has dry mouth, minimal PO water intake. She has been doing much better since last visit. She was receiving hospice care but now of of it. Chronic keflex for recurrent UTI's     ROV 07/15/15 -- history of scleroderma; follow-up visit for chronic cough in the setting of bronchiectasis and prior mycobacterial disease. She was treated for a year with clarithromycin and ethambutol. She is at some risk for aspiration due to her scleroderma and she practices aspiration precautions when eating and drinking. Her cough has flared over the last  month, has been associated with an increase in nasal drainage. No recent evidence for aspiration. Takes chlorpheniramine tid.   ROV 08/19/15 -- follow-up visit for scleroderma with associated bronchiectasis and mycobacterial disease. She is finished a year's treatment for [facility]. She has allergic rhinitis that contributes to chronic cough. I saw her one month ago and she was having more problems with  cough. At that time we restarted loratadine, continued chlorpheniramine and started guaifenesin. She has also been using Hycodan as needed for cough. She practices swallowing precautions given her risk for dysphagia and reflux.  Her cough has been a bit better with the changes above. History is taken from the patient and her husband. Her husband mentions that she doesn't drink much water at all.    Objective:   Physical Exam Filed Vitals:   08/19/15 1533  BP: 126/60  Pulse: 84  Weight: 11 lb 9.6 oz (5.262 kg)  SpO2: 86%   Gen: Pleasant, ill appearing in wheelchair, in no distress, some tremor, ? parkinsonian affect  ENT: No lesions,  mouth clear,  oropharynx clear, gravel voice quality, blind in both eyes  Neck: No JVD, no TMG, no carotid bruits  Lungs: No use of accessory muscles, clear without rales or rhonchi  Cardiovascular: RRR, heart sounds normal, no murmur or gallops, no peripheral edema  Musculoskeletal: No deformities, no cyanosis or clubbing  Neuro: alert, non focal, slightly slow to respond, mild tremor.   Skin: Warm, no lesions or rashes   CT scan 12/08/12 --  Comparison: 05/03/2012, 09/02/2011 and 07/06/2011.  Findings: Mediastinal lymph nodes measure up to 1.5 cm in the  precarinal station, stable. Hilar regions are difficult to  definitively evaluate without IV contrast. No axillary adenopathy.  Pulmonary arteries are borderline enlarged. Coronary artery  calcification. Heart is at the upper limits of normal in size.  Small amount of pericardial fluid is likely physiologic.  Biapical pleural parenchymal scarring. Peribronchovascular  nodularity and scattered air space consolidation have progressed,  especially in the right middle and right lower lobes. Minimal  associated bronchiectasis. Probable mucoid impaction in the  anterior left upper lobe (image 24), unchanged. No pleural fluid.  Airway is unremarkable.  Incidental imaging of the upper abdomen shows no  acute findings.  No worrisome lytic or sclerotic lesions.  IMPRESSION:  1. Progressive peribronchovascular nodularity and airspace  consolidation in the right lung, favoring an ongoing  atypical/mycobacterial infectious process.  2. Mediastinal lymph nodes may be reactive and are stable.      Assessment & Plan:  MAIC (mycobacterium avium-intracellulare complex) Treated. I don't believe we need to pursue repeat CT scan at this time given her improved cough with treatment of his allergies  Scleroderma Swallowing precautions discussed  Chronic allergic rhinitis We will continue your chlorpheniramine 4mg  in the morning  Take loratadine 10mg  around 5pm every day Please use Hycodan as needed for cough Follow with Dr Delton Coombes in 4 months or sooner if you have any problems.

## 2015-08-21 ENCOUNTER — Other Ambulatory Visit: Payer: Self-pay

## 2015-08-21 MED ORDER — LORATADINE 10 MG PO TABS: 10.0000 mg | ORAL_TABLET | Freq: Every day | ORAL | Status: AC

## 2015-09-04 ENCOUNTER — Encounter: Payer: Self-pay | Admitting: Podiatry

## 2015-09-04 ENCOUNTER — Ambulatory Visit (INDEPENDENT_AMBULATORY_CARE_PROVIDER_SITE_OTHER): Payer: Medicare Other | Admitting: Podiatry

## 2015-09-04 VITALS — BP 134/79 | HR 94 | Resp 16

## 2015-09-04 DIAGNOSIS — M2041 Other hammer toe(s) (acquired), right foot: Secondary | ICD-10-CM

## 2015-09-04 DIAGNOSIS — L600 Ingrowing nail: Secondary | ICD-10-CM

## 2015-09-04 NOTE — Progress Notes (Signed)
Subjective:     Patient ID: Audrey Obrien, female   DOB: [birth year], 81 y.o.   MRN: [id number]  HPI patient points to the right big toe stating it's been hurting and is not very communicative but presents with caregiver   Review of Systems  All other systems reviewed and are negative.      Objective:   Physical Exam  Cardiovascular: Intact distal pulses.   Musculoskeletal: Normal range of motion.  Skin: Skin is warm and dry.  Nursing note and vitals reviewed.  patient presents and does not appear to be well oriented currently with eyes closed and has contraction of the right big toe. She does have adequate neurovascular status and good digital perfusion but the right big toe is in a fixed dorsiflex position and painful when pressed from a dorsal position. There is no current nail disease or thickness with possible incurvation of the corner but it appears to be more of a diffuse type of discomfort     Assessment:     Rigid contracture of the right big toe with inflammation and probable trauma secondary to position    Plan:     H&P condition reviewed and padding applied along with wider shoe gear. Patient will be seen back to recheck again if symptoms persist and may require nail surgery

## 2015-09-05 ENCOUNTER — Telehealth: Payer: Self-pay | Admitting: Emergency Medicine

## 2015-09-05 MED ORDER — HYDROCODONE-HOMATROPINE 5-1.5 MG/5ML PO SYRP: 5.0000 mL | ORAL_SOLUTION | Freq: Four times a day (QID) | ORAL | Status: DC | PRN

## 2015-09-05 NOTE — Telephone Encounter (Signed)
This is OK

## 2015-09-05 NOTE — Telephone Encounter (Signed)
Spoke with pt spouse. He reports he needs a written RX mailed to him for pt hycodan cough syrup. Pt is not out but he has been [facility] and it takes a little longer to get this.  Hycodan was last refilled 08/19/15 #240 ml x 0 refills Take 5 mLs by mouth every 6 (six) hours as needed for cough.  Please advise RB thanks

## 2015-09-05 NOTE — Telephone Encounter (Signed)
I have printed off RX and will have RB sign this afternoon

## 2015-09-05 NOTE — Telephone Encounter (Signed)
RX signed and placed in out going mail. Given time of the day it is, it won't go out until Monday.,   Called made spouse aware. Nothing further needed

## 2015-09-09 ENCOUNTER — Telehealth: Payer: Self-pay | Admitting: Emergency Medicine

## 2015-09-09 NOTE — Telephone Encounter (Signed)
error 

## 2015-11-06 ENCOUNTER — Telehealth: Payer: Self-pay | Admitting: Emergency Medicine

## 2015-11-06 MED ORDER — HYDROCODONE-HOMATROPINE 5-1.5 MG/5ML PO SYRP: 5.0000 mL | ORAL_SOLUTION | Freq: Four times a day (QID) | ORAL | Status: DC | PRN

## 2015-11-06 NOTE — Telephone Encounter (Signed)
Spoke with pt's spouse, aware of refill.  Verified address on file.  rx printed, signed, and placed in outgoing mail.  Nothing further needed.

## 2015-11-06 NOTE — Telephone Encounter (Signed)
Yes this is Ok 

## 2015-11-06 NOTE — Telephone Encounter (Signed)
Called and spoke with pt's spouse who stated that pt needs refill on Hycodan cough syrup Last filled 09/05/15 with 240ml with no additional refills Last OV 08/19/15 F/u appt 12/18/15  Dr Delton CoombesByrum, are you ok with refilling this medication? Please advise  Pt would like rx mailed to her once signed

## 2015-12-18 ENCOUNTER — Ambulatory Visit (INDEPENDENT_AMBULATORY_CARE_PROVIDER_SITE_OTHER): Payer: Medicare Other | Admitting: Emergency Medicine

## 2015-12-18 ENCOUNTER — Encounter: Payer: Self-pay | Admitting: Emergency Medicine

## 2015-12-18 VITALS — BP 122/62 | HR 89 | Wt 108.0 lb

## 2015-12-18 DIAGNOSIS — M349 Systemic sclerosis, unspecified: Secondary | ICD-10-CM | POA: Diagnosis not present

## 2015-12-18 DIAGNOSIS — R05 Cough: Secondary | ICD-10-CM | POA: Diagnosis not present

## 2015-12-18 DIAGNOSIS — A310 Pulmonary mycobacterial infection: Secondary | ICD-10-CM | POA: Diagnosis not present

## 2015-12-18 DIAGNOSIS — R053 Chronic cough: Secondary | ICD-10-CM

## 2015-12-18 MED ORDER — HYDROCODONE-HOMATROPINE 5-1.5 MG/5ML PO SYRP: 5.0000 mL | ORAL_SOLUTION | Freq: Four times a day (QID) | ORAL | Status: DC | PRN

## 2015-12-18 NOTE — Progress Notes (Signed)
Subjective:    Patient ID: Audrey Obrien, female    DOB: 05/15/1931, 82 y.o.   MRN: [id number]  HPI 82 yo never smoker w hx scleroderma, HTN, GERD, chronic cough (2-3 yrs). 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 I saw her in the hospital 9/27 for cough and hemoptysis. CT scan of the chest 9/27 showed nodular parenchymal opacity within the RUL with a  'tree in bud ' appearance, progressed compared with 06/2011. FOB done 9/28 showed old blood in the RUL posterior segment, BAL has been smear negative for AFB and fungal (final cx pending). The bacterial cx grew sensitive pseudomonas. Cytology on BAL negative. She has nasal gtt and a lot of throat mucous. Has tried nasonex before, not sure it helped. Not currently on anything. Started mucinex a week ago.    ROV 12/20/13 -- scleroderma, chronic cough, PND, bronchiectasis. Fin[facility]ith[facility] + [facility] She has been on chlorpheniramine, cough stable. She has dry mouth, minimal PO water intake. She has been doing much better since last visit. She was receiving hospice care but now of of it. Chronic keflex for recurrent UTI's     ROV 07/15/15 -- history of scleroderma; follow-up visit for chronic cough in the setting of bronchiectasis and prior mycobacterial disease. She was treated for a year with clarithromycin and ethambutol. She is at some risk for aspiration due to her scleroderma and she practices aspiration precautions when eating and drinking. Her cough has flared over the last month, has been associated with an increase in nasal drainage. No recent evidence for aspiration. Takes chlorpheniramine tid.   ROV 08/19/15 -- follow-up visit for scleroderma with associated bronchiectasis and mycobacterial disease. [facility] She has allergic rhinitis that contributes to chronic cough. I saw her one month ago and she was having more problems with cough.  At that time we restarted loratadine, continued chlorpheniramine and started guaifenesin. She has also been using Hycodan as needed for cough. She practices swallowing precautions given her risk for dysphagia and reflux.  Her cough has been a bit better with the changes above. History is taken from the patient and her husband. Her husband mentions that she doesn't drink much water at all.   ROV 12/18/15 -- follow up for chronic cough, hx scleroderma, MAIC s/p therapy x 1 year, allergic rhinitis. She has been fairly well controlled since last visit. She is getting loratadine 10mg  daily, chlorpheniramine 4mg  qam.  Her cough is non-productive. She has swallowing difficulty and low appetite, some cough with PO's. She is not interactive today. Her husband speaks for her.    Objective:   Physical Exam Filed Vitals:   12/18/15 1636  BP: 122/62  Pulse: 89  Weight: 108 lb (48.988 kg)  SpO2: 98%   Gen: Pleasant, ill appearing in wheelchair, in no distress, some tremor, ? parkinsonian affect  ENT: No lesions,  mouth clear,  oropharynx clear, gravel voice quality, blind in both eyes  Neck: No JVD, no TMG, no carotid bruits  Lungs: No use of accessory muscles, clear without rales or rhonchi  Cardiovascular: RRR, heart sounds normal, no murmur or gallops, no peripheral edema  Musculoskeletal: No deformities, no cyanosis or clubbing  Neuro: alert, non focal, slightly slow to respond, mild tremor.   Skin: Warm, no lesions or rashes   CT scan 12/08/12 --  Comparison: 05/03/2012, 09/02/2011 and 07/06/2011.  Findings: Mediastinal  lymph nodes measure up to 1.5 cm in the  precarinal station, stable. Hilar regions are difficult to  definitively evaluate without IV contrast. No axillary adenopathy.  Pulmonary arteries are borderline enlarged. Coronary artery  calcification. Heart is at the upper limits of normal in size.  Small amount of pericardial fluid is likely physiologic.  Biapical pleural  parenchymal scarring. Peribronchovascular  nodularity and scattered air space consolidation have progressed,  especially in the right middle and right lower lobes. Minimal  associated bronchiectasis. Probable mucoid impaction in the  anterior left upper lobe (image 24), unchanged. No pleural fluid.  Airway is unremarkable.  Incidental imaging of the upper abdomen shows no acute findings.  No worrisome lytic or sclerotic lesions.  IMPRESSION:  1. Progressive peribronchovascular nodularity and airspace  consolidation in the right lung, favoring an ongoing  atypical/mycobacterial infectious process.  2. Mediastinal lymph nodes may be reactive and are stable.      Assessment & Plan:  MAIC (mycobacterium avium-intracellulare complex) Treated. No current clinical evidence for active disease. Will defer further imaging for now given her stability and her overall debilitated state.   Chronic cough Multifactorial due to allergies, to suspected microaspiration. Doubt at this point that this is active [facility]. She is tolerating and benefits from loratadine, chlorpheniramine and hycodan prn. Will continue these  Scleroderma Discussed the disease with pt's husband, the manifestations especially with regard to aspiration and the risk for PNA's.

## 2015-12-18 NOTE — Assessment & Plan Note (Signed)
Discussed the disease with pt's husband, the manifestations especially with regard to aspiration and the risk for PNA's.

## 2015-12-18 NOTE — Assessment & Plan Note (Signed)
Treated. No current clinical evidence for active disease. Will defer further imaging for now given her stability and her overall debilitated state.

## 2015-12-18 NOTE — Patient Instructions (Signed)
Please continue chlorpheniramine and loratadine as you are taking them  Please continue to use your hycodan cough syrup as you need it. We will write your prescription refills for you to send to [facility] as you need them.  Follow with Dr Delton CoombesByrum in 6 months or sooner if you have any problems

## 2015-12-18 NOTE — Assessment & Plan Note (Signed)
Multifactorial due to allergies, to suspected microaspiration. D[facility] at this point that [facility]. She is tolerating and benefits from loratadine, chlorpheniramine and hycodan prn. Will continue these

## 2016-01-02 ENCOUNTER — Telehealth: Payer: Self-pay | Admitting: Emergency Medicine

## 2016-01-02 MED ORDER — HYDROCODONE-HOMATROPINE 5-1.5 MG/5ML PO SYRP: 5.0000 mL | ORAL_SOLUTION | Freq: Four times a day (QID) | ORAL | Status: AC | PRN

## 2016-01-02 NOTE — Telephone Encounter (Signed)
Spoke with spouse. Pt is wanting RX for hycodan mailed to her. Last refilled 12/18/15 #240 ml Take 5 mLs by mouth every 6 (six) hours as needed for cough. He reports the reason he is calling in advanced is because they send this to [facility]. Please advise Dr. Delton Coombes thanks

## 2016-01-02 NOTE — Telephone Encounter (Signed)
Yes this is ok 

## 2016-01-02 NOTE — Telephone Encounter (Signed)
Called spoke with pt spouse. Aware RX placed in mail. Nothing further needed

## 2016-01-26 ENCOUNTER — Encounter (HOSPITAL_COMMUNITY): Payer: Self-pay | Admitting: Emergency Medicine

## 2016-01-26 ENCOUNTER — Inpatient Hospital Stay (HOSPITAL_COMMUNITY)
Admission: EM | Admit: 2016-01-26 | Discharge: 2016-01-30 | DRG: 689 | Disposition: A | Discharge status: Hospice | Payer: Medicare Other | Attending: Internal Medicine | Admitting: Internal Medicine

## 2016-01-26 ENCOUNTER — Inpatient Hospital Stay (HOSPITAL_COMMUNITY): Payer: Medicare Other

## 2016-01-26 ENCOUNTER — Emergency Department (HOSPITAL_COMMUNITY): Payer: Medicare Other

## 2016-01-26 DIAGNOSIS — G40909 Epilepsy, unspecified, not intractable, without status epilepticus: Secondary | ICD-10-CM | POA: Diagnosis present

## 2016-01-26 DIAGNOSIS — Z970 Presence of artificial eye: Secondary | ICD-10-CM | POA: Diagnosis not present

## 2016-01-26 DIAGNOSIS — R4189 Other symptoms and signs involving cognitive functions and awareness: Secondary | ICD-10-CM

## 2016-01-26 DIAGNOSIS — J189 Pneumonia, unspecified organism: Secondary | ICD-10-CM | POA: Diagnosis not present

## 2016-01-26 DIAGNOSIS — R8290 Unspecified abnormal findings in urine: Secondary | ICD-10-CM

## 2016-01-26 DIAGNOSIS — R404 Transient alteration of awareness: Secondary | ICD-10-CM | POA: Diagnosis not present

## 2016-01-26 DIAGNOSIS — H9190 Unspecified hearing loss, unspecified ear: Secondary | ICD-10-CM | POA: Diagnosis present

## 2016-01-26 DIAGNOSIS — J470 Bronchiectasis with acute lower respiratory infection: Secondary | ICD-10-CM | POA: Diagnosis present

## 2016-01-26 DIAGNOSIS — Z515 Encounter for palliative care: Secondary | ICD-10-CM | POA: Diagnosis not present

## 2016-01-26 DIAGNOSIS — R32 Unspecified urinary incontinence: Secondary | ICD-10-CM | POA: Diagnosis present

## 2016-01-26 DIAGNOSIS — I10 Essential (primary) hypertension: Secondary | ICD-10-CM | POA: Diagnosis present

## 2016-01-26 DIAGNOSIS — K5900 Constipation, unspecified: Secondary | ICD-10-CM

## 2016-01-26 DIAGNOSIS — M349 Systemic sclerosis, unspecified: Secondary | ICD-10-CM | POA: Diagnosis present

## 2016-01-26 DIAGNOSIS — I2510 Atherosclerotic heart disease of native coronary artery without angina pectoris: Secondary | ICD-10-CM | POA: Diagnosis present

## 2016-01-26 DIAGNOSIS — R569 Unspecified convulsions: Secondary | ICD-10-CM

## 2016-01-26 DIAGNOSIS — H540 Blindness, both eyes: Secondary | ICD-10-CM | POA: Diagnosis present

## 2016-01-26 DIAGNOSIS — E871 Hypo-osmolality and hyponatremia: Secondary | ICD-10-CM | POA: Diagnosis present

## 2016-01-26 DIAGNOSIS — G3183 Dementia with Lewy bodies: Secondary | ICD-10-CM | POA: Diagnosis present

## 2016-01-26 DIAGNOSIS — F0280 Dementia in other diseases classified elsewhere without behavioral disturbance: Secondary | ICD-10-CM | POA: Diagnosis present

## 2016-01-26 DIAGNOSIS — N390 Urinary tract infection, site not specified: Principal | ICD-10-CM | POA: Insufficient documentation

## 2016-01-26 DIAGNOSIS — I272 Other secondary pulmonary hypertension: Secondary | ICD-10-CM | POA: Diagnosis present

## 2016-01-26 DIAGNOSIS — R443 Hallucinations, unspecified: Secondary | ICD-10-CM | POA: Diagnosis present

## 2016-01-26 DIAGNOSIS — Z79899 Other long term (current) drug therapy: Secondary | ICD-10-CM

## 2016-01-26 DIAGNOSIS — B9620 Unspecified Escherichia coli [E. coli] as the cause of diseases classified elsewhere: Secondary | ICD-10-CM | POA: Diagnosis present

## 2016-01-26 DIAGNOSIS — F0390 Unspecified dementia without behavioral disturbance: Secondary | ICD-10-CM

## 2016-01-26 DIAGNOSIS — Z7982 Long term (current) use of aspirin: Secondary | ICD-10-CM

## 2016-01-26 DIAGNOSIS — Z66 Do not resuscitate: Secondary | ICD-10-CM | POA: Diagnosis not present

## 2016-01-26 DIAGNOSIS — J479 Bronchiectasis, uncomplicated: Secondary | ICD-10-CM

## 2016-01-26 DIAGNOSIS — L8990 Pressure ulcer of unspecified site, unspecified stage: Secondary | ICD-10-CM | POA: Diagnosis present

## 2016-01-26 DIAGNOSIS — G9341 Metabolic encephalopathy: Secondary | ICD-10-CM | POA: Diagnosis present

## 2016-01-26 DIAGNOSIS — E860 Dehydration: Secondary | ICD-10-CM | POA: Diagnosis not present

## 2016-01-26 DIAGNOSIS — Z7189 Other specified counseling: Secondary | ICD-10-CM | POA: Diagnosis not present

## 2016-01-26 DIAGNOSIS — J8410 Pulmonary fibrosis, unspecified: Secondary | ICD-10-CM | POA: Diagnosis present

## 2016-01-26 LAB — COMPREHENSIVE METABOLIC PANEL
ALT: 11 U/L — ABNORMAL LOW (ref 14–54)
ANION GAP: 12 (ref 5–15)
AST: 23 U/L (ref 15–41)
Albumin: 4 g/dL (ref 3.5–5.0)
Alkaline Phosphatase: 55 U/L (ref 38–126)
BILIRUBIN TOTAL: 0.7 mg/dL (ref 0.3–1.2)
BUN: 11 mg/dL (ref 6–20)
CALCIUM: 9.2 mg/dL (ref 8.9–10.3)
CO2: 21 mmol/L — ABNORMAL LOW (ref 22–32)
Chloride: 96 mmol/L — ABNORMAL LOW (ref 101–111)
Creatinine, Ser: 0.78 mg/dL (ref 0.44–1.00)
Glucose, Bld: 92 mg/dL (ref 65–99)
POTASSIUM: 4.3 mmol/L (ref 3.5–5.1)
Sodium: 129 mmol/L — ABNORMAL LOW (ref 135–145)
Total Protein: 7 g/dL (ref 6.5–8.1)

## 2016-01-26 LAB — URINALYSIS, ROUTINE W REFLEX MICROSCOPIC
BILIRUBIN URINE: NEGATIVE
Glucose, UA: NEGATIVE mg/dL
HGB URINE DIPSTICK: NEGATIVE
KETONES UR: 15 mg/dL — AB
NITRITE: POSITIVE — AB
PH: 6 (ref 5.0–8.0)
Protein, ur: NEGATIVE mg/dL
SPECIFIC GRAVITY, URINE: 1.013 (ref 1.005–1.030)

## 2016-01-26 LAB — CBC WITH DIFFERENTIAL/PLATELET
BASOS PCT: 1 %
Basophils Absolute: 0 10*3/uL (ref 0.0–0.1)
EOS ABS: 0.1 10*3/uL (ref 0.0–0.7)
Eosinophils Relative: 1 %
HEMATOCRIT: 34.2 % — AB (ref 36.0–46.0)
HEMOGLOBIN: 12 g/dL (ref 12.0–15.0)
LYMPHS ABS: 0.6 10*3/uL — AB (ref 0.7–4.0)
Lymphocytes Relative: 12 %
MCH: 33.8 pg (ref 26.0–34.0)
MCHC: 35.1 g/dL (ref 30.0–36.0)
MCV: 96.3 fL (ref 78.0–100.0)
MONO ABS: 0.7 10*3/uL (ref 0.1–1.0)
MONOS PCT: 13 %
NEUTROS ABS: 3.8 10*3/uL (ref 1.7–7.7)
NEUTROS PCT: 73 %
Platelets: 215 10*3/uL (ref 150–400)
RBC: 3.55 MIL/uL — ABNORMAL LOW (ref 3.87–5.11)
RDW: 12.8 % (ref 11.5–15.5)
WBC: 5.2 10*3/uL (ref 4.0–10.5)

## 2016-01-26 LAB — URINE MICROSCOPIC-ADD ON: RBC / HPF: NONE SEEN RBC/hpf (ref 0–5)

## 2016-01-26 LAB — CK: CK TOTAL: 86 U/L (ref 38–234)

## 2016-01-26 LAB — PROCALCITONIN

## 2016-01-26 LAB — I-STAT CG4 LACTIC ACID, ED
LACTIC ACID, VENOUS: 1.66 mmol/L (ref 0.5–2.0)
Lactic Acid, Venous: 0.76 mmol/L (ref 0.5–2.0)

## 2016-01-26 MED ORDER — BISACODYL 5 MG PO TBEC: 5.0000 mg | DELAYED_RELEASE_TABLET | Freq: Every day | ORAL | Status: DC | PRN

## 2016-01-26 MED ORDER — ACETAMINOPHEN 650 MG RE SUPP: 650.0000 mg | Freq: Four times a day (QID) | RECTAL | Status: DC | PRN

## 2016-01-26 MED ORDER — LOSARTAN POTASSIUM 50 MG PO TABS
100.0000 mg | ORAL_TABLET | Freq: Every day | ORAL | Status: DC
  Administered 2016-01-27 – 2016-01-30 (×4): 100 mg via ORAL
  Filled 2016-01-26 (×4): qty 2

## 2016-01-26 MED ORDER — CEFTRIAXONE SODIUM 1 G IJ SOLR
1.0000 g | INTRAMUSCULAR | Status: DC
  Administered 2016-01-27: 1 g via INTRAVENOUS
  Filled 2016-01-26 (×2): qty 10

## 2016-01-26 MED ORDER — SODIUM CHLORIDE 0.9 % IV BOLUS (SEPSIS): 500.0000 mL | INTRAVENOUS | Status: AC

## 2016-01-26 MED ORDER — PRIMIDONE 250 MG PO TABS
125.0000 mg | ORAL_TABLET | Freq: Every day | ORAL | Status: DC
Start: 2016-01-27 — End: 2016-01-30
  Administered 2016-01-27 – 2016-01-30 (×4): 125 mg via ORAL
  Filled 2016-01-26 (×4): qty 1

## 2016-01-26 MED ORDER — HYDRALAZINE HCL 20 MG/ML IJ SOLN
10.0000 mg | Freq: Once | INTRAMUSCULAR | Status: AC
  Administered 2016-01-26: 10 mg via INTRAVENOUS
  Filled 2016-01-26: qty 1

## 2016-01-26 MED ORDER — DEXTROSE 5 % IV SOLN
2.0000 g | Freq: Once | INTRAVENOUS | Status: AC
  Administered 2016-01-26: 2 g via INTRAVENOUS
  Filled 2016-01-26: qty 2

## 2016-01-26 MED ORDER — HYDROCODONE-HOMATROPINE 5-1.5 MG/5ML PO SYRP: 5.0000 mL | ORAL_SOLUTION | Freq: Four times a day (QID) | ORAL | Status: DC | PRN

## 2016-01-26 MED ORDER — ONDANSETRON HCL 4 MG PO TABS: 4.0000 mg | ORAL_TABLET | Freq: Four times a day (QID) | ORAL | Status: DC | PRN

## 2016-01-26 MED ORDER — ENOXAPARIN SODIUM 40 MG/0.4ML ~~LOC~~ SOLN
40.0000 mg | SUBCUTANEOUS | Status: DC
  Administered 2016-01-26 – 2016-01-29 (×4): 40 mg via SUBCUTANEOUS
  Filled 2016-01-26 (×4): qty 0.4

## 2016-01-26 MED ORDER — SODIUM CHLORIDE 0.9% FLUSH
3.0000 mL | Freq: Two times a day (BID) | INTRAVENOUS | Status: DC
  Administered 2016-01-27 – 2016-01-30 (×6): 3 mL via INTRAVENOUS

## 2016-01-26 MED ORDER — ACETAMINOPHEN 325 MG PO TABS: 650.0000 mg | ORAL_TABLET | Freq: Four times a day (QID) | ORAL | Status: DC | PRN

## 2016-01-26 MED ORDER — VANCOMYCIN HCL IN DEXTROSE 1-5 GM/200ML-% IV SOLN: 1000.0000 mg | Freq: Once | INTRAVENOUS | Status: DC

## 2016-01-26 MED ORDER — DEXTROSE 5 % IV SOLN
500.0000 mg | INTRAVENOUS | Status: DC
  Filled 2016-01-26: qty 500

## 2016-01-26 MED ORDER — FLEET ENEMA 7-19 GM/118ML RE ENEM: 1.0000 | ENEMA | Freq: Once | RECTAL | Status: DC | PRN

## 2016-01-26 MED ORDER — HYDRALAZINE HCL 20 MG/ML IJ SOLN
5.0000 mg | Freq: Once | INTRAMUSCULAR | Status: AC
  Administered 2016-01-26: 5 mg via INTRAVENOUS
  Filled 2016-01-26: qty 1

## 2016-01-26 MED ORDER — ASPIRIN EC 81 MG PO TBEC
81.0000 mg | DELAYED_RELEASE_TABLET | Freq: Every day | ORAL | Status: DC
  Administered 2016-01-28 – 2016-01-29 (×2): 81 mg via ORAL
  Filled 2016-01-26 (×2): qty 1

## 2016-01-26 MED ORDER — SODIUM CHLORIDE 0.9 % IV SOLN
INTRAVENOUS | Status: AC
  Administered 2016-01-26: 75 mL/h via INTRAVENOUS

## 2016-01-26 MED ORDER — SODIUM CHLORIDE 0.9 % IV BOLUS (SEPSIS)
1000.0000 mL | Freq: Once | INTRAVENOUS | Status: AC
  Administered 2016-01-26: 1000 mL via INTRAVENOUS

## 2016-01-26 MED ORDER — ONDANSETRON HCL 4 MG/2ML IJ SOLN: 4.0000 mg | Freq: Four times a day (QID) | INTRAMUSCULAR | Status: DC | PRN

## 2016-01-26 NOTE — H&P (Signed)
[facility] History and Physical  Audrey Obrien QQV:[id number] DOB: 1931/07/14 DOA: 01/26/2016  Referring physician: Emergency Department PCP: Dorian Heckle, MD   CHIEF COMPLAINT:  unresponsiveness                 HPI: Audrey Obrien is a 82 y.o. female with advanced dementia, scleroderma  / bronchiectasis and mycobacterial disease, s/p one year treatment for [facility].  She is on Myosoline for tremors. Family had been in Hospice a few years back and family met with them last week but patient apparently didn't meet criteria. Has had placement of a   prosthetic right eye a few years back. Following surgery patient began having seizures and was placed on Dilantin. This was eventually stopped.   Patient rarely ambulates, she has to be fed by her husband, she speaks very little. Brought to ED today after husband found her to be difficult to arouse this am. She also seemed to be in a stiff position.  Over the last five days she has become incontinent of urine and also stool. No diarrhea, in fact husband describes hard stools. He endorses a hacking cough. Patient drinks very little. Her appetite isn't great but she hasn't really lost weight. She has been on Cipro for a few days for a UTI.  ED COURSE:           Labs:   Lactic acid normal.  Sodium 129, normal renal function. WBC 5.2.   Urinalysis:    Clear, many bacteria, positive nitrites, 6-30 WBC           CXR:    RUL airspace disease.          Medications  sodium chloride 0.9 % bolus 1,000 mL (1,000 mLs Intravenous New Bag/Given 01/26/16 1241)    Followed by  sodium chloride 0.9 % bolus 500 mL (not administered)  cefTRIAXone (ROCEPHIN) 1 g in dextrose 5 % 50 mL IVPB (not administered)  ceFEPIme (MAXIPIME) 2 g in dextrose 5 % 50 mL IVPB (not administered)  azithromycin (ZITHROMAX) 500 mg in dextrose 5 % 250 mL IVPB (not administered)  cefTRIAXone (ROCEPHIN) 2 g in dextrose 5 % 50 mL IVPB (2 g Intravenous New Bag/Given 01/26/16 1241)     Review of Systems  Unable to perform ROS: dementia    Past Medical History  Diagnosis Date  . Hypertension   . Acne rosacea   . Chronic rhinitis     no benefit from antihistamines  . Gallbladder & bile duct stone 2007    1.6 cm GB stone; no symptoms  . Pneumonia 2007  . 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 C. difficile colitis 11/07  . Spastic dysphonia     botox injs at [facility]  . Hypertension   . Scleroderma (HCC)     probable, ANA+ 1:160, anticentromers strongly pos, USRNP-, TH/TO-,RF equivocal, CCP-, Skin puffiness of hands, telangectasias of hands, raynauds, ECHO increased pulmonary pressures 8/11, pfts stable  . S/P right heart catheterization 10/11  . Benign essential tremor   . Osteoporosis     started alendronate 6/12  . Glaucoma   . Spastic dysphonia     Get [facility] at [facility]  . Benign essential tremor   . Dementia   . Raynaud disease   . Blindness     per husband, both eyes   Past Surgical History  Procedure Laterality Date  . Lung surgery  1982  . Corneal transplant  SOCIAL HISTORY:  reports that she has never smoked. She has never used smokeless tobacco. She reports that she does not drink alcohol or use illicit drugs. Lives:  At home with husband   Assistive devices:   Doesn't ambulate.   No Known Allergies  Family History  Problem Relation Age of Onset  . Asthma Son     Prior to Admission medications   Medication Sig Start Date End Date Taking? Authorizing Provider  aspirin 81 MG tablet Take 81 mg by mouth at bedtime.     Historical Provider, MD  Chlorpheniramine Maleate (CHLORPHEN MALEATE PO) Take by mouth.    Historical Provider, MD  HYDROcodone-homatropine (HYCODAN) 5-1.5 MG/5ML syrup Take 5 mLs by mouth every 6 (six) hours as needed for cough. 01/02/16   Collene Gobble, MD  loratadine (CLARITIN) 10 MG tablet Take 1 tablet (10 mg total) by mouth daily. 08/21/15   Collene Gobble, MD  losartan (COZAAR) 100 MG  tablet  07/23/15   Historical Provider, MD  phenazopyridine (PYRIDIUM) 100 MG tablet Take 100 mg by mouth daily. After a meal    Historical Provider, MD  primidone (MYSOLINE) 250 MG tablet Take 250 mg by mouth 1 day or 1 dose. Takes 1/2 tab daily    Historical Provider, MD  traMADol (ULTRAM) 50 MG tablet Take 50 mg by mouth every 6 (six) hours as needed.    Historical Provider, MD  URELLE (URELLE/URISED) 81 MG TABS tablet Take 1 tablet by mouth 4 (four) times daily.    Historical Provider, MD   PHYSICAL EXAM: Filed Vitals:   01/26/16 1200  BP: 164/75  Pulse: 89  Temp: 97.5 F (36.4 C)  TempSrc: Oral  Resp: 18  SpO2: 92%    Wt Readings from Last 3 Encounters:  12/18/15 48.988 kg (108 lb)  08/19/15 5.262 kg (11 lb 9.6 oz)  07/15/15 50.621 kg (111 lb 9.6 oz)    General:  White female lying in bed with eyes closed. Appears calm and comfortable Eyes: She will not open her eyes.  ENT: hard of hearing, tongue dry Neck: no LAD, no masses Cardiovascular: RRR, no murmurs. No LE edema.  Respiratory: Respirations even and unlabored. Poor effort for respiratory exam. No wheezes / rales .   Abdomen: soft, non-distended, non-tender, active bowel sounds. Fullness in LLQ.  Skin: no rash seen on limited exam Musculoskeletal: decreased strength in bilateral upper extremities, L>R.  Psychiatric: cooperative. Doesn't answer questions.  Neurologic: follows some commands. grossly non-focal.         LABS ON ADMISSION:    Basic Metabolic Panel:  Recent Labs Lab 01/26/16 1228  NA 129*  K 4.3  CL 96*  CO2 21*  GLUCOSE 92  BUN 11  CREATININE 0.78  CALCIUM 9.2   Liver Function Tests:  Recent Labs Lab 01/26/16 1228  AST 23  ALT 11*  ALKPHOS 55  BILITOT 0.7  PROT 7.0  ALBUMIN 4.0    CBC:  Recent Labs Lab 01/26/16 1228  WBC 5.2  NEUTROABS 3.8  HGB 12.0  HCT 34.2*  MCV 96.3  PLT 215    Creatinine clearance cannot be calculated (Unknown ideal weight.)  Radiological Exams  on Admission: Dg Chest Port 1 View  01/26/2016  CLINICAL DATA:  Septic  uti   Pt unable  To  Communicate EXAM: PORTABLE CHEST - 1 VIEW COMPARISON:  08/29/2012 and previous FINDINGS: Progression of right upper lobe scattered airspace disease. Left lung remains clear. Heart size normal. Prominent pulmonary  outflow tract. Atheromatous aortic arch. No effusion.  No pneumothorax. Visualized skeletal structures are unremarkable. IMPRESSION: 1. Worsening right upper lobe airspace disease. Electronically Signed   By: Lucrezia Europe M.D.   On: 01/26/2016 12:38    ASSESSMENT / PLAN   Acute encephalopathy (unresponsiveness) with underlying advanced dementia. At baseline she has poor functional status. She has been hallucinating at home. Mental status changes could be infection related.  Symptoms could be medication related (on Cipro). Primadone can cause hallucinations but she has been on this long term. She is hyponatremic but this is chronic and sodium is at baseline. I wonder about seizure activity as patient states patient was "stiff" when he found her this am and she has developed bowel and bladder incontinence over the last week. History of seizures following eye surgery several years ago.  -admit to telemetry -Gentle IV hydration, dry clinically. Got a liter bolus in ED.  -culture urine, treat for UTI -Consider MRI brain tomorrow if not improving.  -procalcitonin, CK  -If above studies negative then needs Neurology evaluation -PT evaluation -RN swallow evaluation. Soft diet if passes  Abnormal urinalysis, + nitrites and WBCs. She has had similar findings on last two urinalysis with only only positive culture (e.coli).  -send urine for culture -may be negative since already on antibiotics -Rocephin  PNA (possible) RUL. She has chronic RUL findings / mycobacterial disease . Will hold on treatment for now given no respiratory distress, fever or white count .  Hypertension. Controlled -continue home  cozaar.   Severe pulmonary HTN on echo in 2013. High ventricular filling pressures. LVEF 55-60%.  ? Constipation. Husband describes hard stools, difficult for patient to expel without assistance. She has some areas of fullness in LLQ on exam. Obtain KUB and if constipated will need purge.   Hyponatremic, chronic. Stable  Scleroderma  CONSULTANTS:   none  Code Status: DNR DVT Prophylaxis: Lovenox  Family Communication:  Patient alert, oriented and understands plan of care.  Disposition Plan: Discharge to home in 2-3 days   Time spent: 60 minutes Tye Savoy  NP [facility] Pager [phone]

## 2016-01-26 NOTE — ED Notes (Signed)
Pt to ed via [facility] from home, with c/o decreased LOC since yesterday. Has been treated for UTI since friday with cipro.

## 2016-01-26 NOTE — Progress Notes (Signed)
Pharmacy Code Sepsis Protocol  Time of code sepsis page: 1210  Antibiotics delivered at 1234 (RN obtained from [facility])  Antibiotics administered prior to code at  (if checked, omit next 2 questions)  Were antibiotics ordered at the time of the code sepsis page? No Was it required to contact the physician?  Physician not contacted  Physician contacted to order antibiotics for code sepsis  Physician contacted to recommend changing antibiotics  Pharmacy consulted for: N/A  Anti-infectives    Start     Dose/Rate Route Frequency Ordered Stop   01/27/16 1300  cefTRIAXone (ROCEPHIN) 1 g in dextrose 5 % 50 mL IVPB     1 g 100 mL/hr over 30 Minutes Intravenous Every 24 hours 01/26/16 1232     01/26/16 1245  cefTRIAXone (ROCEPHIN) 2 g in dextrose 5 % 50 mL IVPB     2 g 100 mL/hr over 30 Minutes Intravenous  Once 01/26/16 1231          Nurse education provided:  Minutes left to administer antibiotics to achieve 1 hour goal  Correct order of antibiotic administration  Antibiotic Y-site compatibilities     Saanvika Vazques, Drake Leach, PharmD 01/26/2016, 12:34 PM

## 2016-01-26 NOTE — ED Provider Notes (Signed)
CSN: [id number]     Arrival date & time 01/26/16  1131 History   First MD Initiated Contact with Patient 01/26/16 1144     Chief Complaint  Patient presents with  . unresponsive      (Consider location/radiation/quality/duration/timing/severity/associated sxs/prior Treatment) HPI Comments: Patient here with altered level of consciousness since yesterday. Started on ciprofloxacin for UTI 3 days. No reported fever, vomiting, diarrhea. Patient was at home and is normally ambulatory. EMS was called and patient transported here. No further history obtainable due to her current state  The history is provided by medical records and the EMS personnel. The history is limited by the condition of the patient.    Past Medical History  Diagnosis Date  . Hypertension   . Acne rosacea   . Chronic rhinitis     no benefit from antihistamines  . Gallbladder & bile duct stone 2007    1.6 cm GB stone; no symptoms  . Pneumonia 2007  . 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 C. difficile colitis 11/07  . Spastic dysphonia     botox injs at [facility]  . Hypertension   . Scleroderma (HCC)     probable, ANA+ 1:160, anticentromers strongly pos, USRNP-, TH/TO-,RF equivocal, CCP-, Skin puffiness of hands, telangectasias of hands, raynauds, ECHO increased pulmonary pressures 8/11, pfts stable  . S/P right heart catheterization 10/11  . Benign essential tremor   . Osteoporosis     started alendronate 6/12  . Glaucoma   . Spastic dysphonia     Get Botox at [facility]  . Benign essential tremor   . Dementia   . Raynaud disease   . Blindness     per husband, both eyes   Past Surgical History  Procedure Laterality Date  . Lung surgery  1982  . Corneal transplant     Family History  Problem Relation Age of Onset  . Asthma Son    Social History  Substance Use Topics  . Smoking status: Never Smoker   . Smokeless tobacco: Never Used  . Alcohol Use: No   OB History    No data  available     Review of Systems  Unable to perform ROS: Acuity of condition      Allergies  Review of patient's allergies indicates no known allergies.  Home Medications   Prior to Admission medications   Medication Sig Start Date End Date Taking? Authorizing Provider  aspirin 81 MG tablet Take 81 mg by mouth at bedtime.     Historical Provider, MD  Chlorpheniramine Maleate (CHLORPHEN MALEATE PO) Take by mouth.    Historical Provider, MD  HYDROcodone-homatropine (HYCODAN) 5-1.5 MG/5ML syrup Take 5 mLs by mouth every 6 (six) hours as needed for cough. 01/02/16   Leslye Peer, MD  loratadine (CLARITIN) 10 MG tablet Take 1 tablet (10 mg total) by mouth daily. 08/21/15   Leslye Peer, MD  losartan (COZAAR) 100 MG tablet  07/23/15   Historical Provider, MD  phenazopyridine (PYRIDIUM) 100 MG tablet Take 100 mg by mouth daily. After a meal    Historical Provider, MD  primidone (MYSOLINE) 250 MG tablet Take 250 mg by mouth 1 day or 1 dose. Takes 1/2 tab daily    Historical Provider, MD  traMADol (ULTRAM) 50 MG tablet Take 50 mg by mouth every 6 (six) hours as needed.    Historical Provider, MD  URELLE (URELLE/URISED) 81 MG TABS tablet Take 1 tablet  by mouth 4 (four) times daily.    Historical Provider, MD   BP 164/75 mmHg  Pulse 89  Temp(Src) 97.5 F (36.4 C) (Oral)  Resp 18  SpO2 92% Physical Exam  Constitutional: She appears well-developed and well-nourished. She appears lethargic.  Non-toxic appearance. No distress.  HENT:  Head: Normocephalic and atraumatic.  Eyes: Conjunctivae, EOM and lids are normal. Pupils are equal, round, and reactive to light.  Neck: Normal range of motion. Neck supple. No tracheal deviation present. No thyroid mass present.  Cardiovascular: Normal rate, regular rhythm and normal heart sounds.  Exam reveals no gallop.   No murmur heard. Pulmonary/Chest: Effort normal and breath sounds normal. No stridor. No respiratory distress. She has no decreased breath  sounds. She has no wheezes. She has no rhonchi. She has no rales.  Abdominal: Soft. Normal appearance and bowel sounds are normal. She exhibits no distension. There is no tenderness. There is no rebound and no CVA tenderness.  Musculoskeletal: Normal range of motion. She exhibits no edema or tenderness.  Neurological: She has normal strength. She appears lethargic. No cranial nerve deficit or sensory deficit. GCS eye subscore is 2. GCS verbal subscore is 3. GCS motor subscore is 5.  Skin: Skin is warm and dry. No abrasion and no rash noted.  Nursing note and vitals reviewed.   ED Course  Procedures (including critical care time) Labs Review Labs Reviewed  CBC WITH DIFFERENTIAL/PLATELET - Abnormal; Notable for the following:    RBC 3.55 (*)    HCT 34.2 (*)    Lymphs Abs 0.6 (*)    All other components within normal limits  URINALYSIS, ROUTINE W REFLEX MICROSCOPIC (NOT AT [facility]) - Abnormal; Notable for the following:    Ketones, ur 15 (*)    Nitrite POSITIVE (*)    Leukocytes, UA MODERATE (*)    All other components within normal limits  URINE MICROSCOPIC-ADD ON - Abnormal; Notable for the following:    Squamous Epithelial / LPF 0-5 (*)    Bacteria, UA MANY (*)    All other components within normal limits  CULTURE, BLOOD (ROUTINE X 2)  CULTURE, BLOOD (ROUTINE X 2)  URINE CULTURE  COMPREHENSIVE METABOLIC PANEL  I-STAT CG4 LACTIC ACID, ED  I-STAT CG4 LACTIC ACID, ED    Imaging Review Dg Chest Port 1 View  01/26/2016  CLINICAL DATA:  Septic  uti   Pt unable  To  Communicate EXAM: PORTABLE CHEST - 1 VIEW COMPARISON:  08/29/2012 and previous FINDINGS: Progression of right upper lobe scattered airspace disease. Left lung remains clear. Heart size normal. Prominent pulmonary outflow tract. Atheromatous aortic arch. No effusion.  No pneumothorax. Visualized skeletal structures are unremarkable. IMPRESSION: 1. Worsening right upper lobe airspace disease. Electronically Signed   By: Corlis Leak  M.D.   On: 01/26/2016 12:38   I have personally reviewed and evaluated these images and lab results as part of my medical decision-making.   EKG Interpretation None      MDM   Final diagnoses:  None    Patient's chest x-ray consistent with pneumonia. Urinalysis shows infection. She was initially started on IV Rocephin for suspected urosepsis. Chest x-ray shows pneumonia and started on appropriate antibiotic for community acquired pneumonia. Does have a valid DO NOT RESUSCITATE. We'll admit to medicine    Lorre Nick, MD 01/26/16 1257

## 2016-01-26 NOTE — Progress Notes (Signed)
[facility] notified that pt bp 194/83 HR 104 after  of Hydralizine wea given pt is NPO due to unresponsive and fail swallow screen awaiting new orders

## 2016-01-27 ENCOUNTER — Inpatient Hospital Stay (HOSPITAL_COMMUNITY): Payer: Medicare Other

## 2016-01-27 DIAGNOSIS — L8990 Pressure ulcer of unspecified site, unspecified stage: Secondary | ICD-10-CM

## 2016-01-27 LAB — BASIC METABOLIC PANEL
ANION GAP: 13 (ref 5–15)
ANION GAP: 14 (ref 5–15)
Anion gap: 12 (ref 5–15)
BUN: 10 mg/dL (ref 6–20)
BUN: 10 mg/dL (ref 6–20)
BUN: 8 mg/dL (ref 6–20)
CALCIUM: 8.7 mg/dL — AB (ref 8.9–10.3)
CALCIUM: 8.4 mg/dL — AB (ref 8.9–10.3)
CHLORIDE: 96 mmol/L — AB (ref 101–111)
CO2: 20 mmol/L — ABNORMAL LOW (ref 22–32)
CO2: 19 mmol/L — AB (ref 22–32)
CO2: 18 mmol/L — AB (ref 22–32)
Calcium: 8.2 mg/dL — ABNORMAL LOW (ref 8.9–10.3)
Chloride: 97 mmol/L — ABNORMAL LOW (ref 101–111)
Chloride: 97 mmol/L — ABNORMAL LOW (ref 101–111)
Creatinine, Ser: 0.72 mg/dL (ref 0.44–1.00)
Creatinine, Ser: 0.73 mg/dL (ref 0.44–1.00)
Creatinine, Ser: 0.73 mg/dL (ref 0.44–1.00)
GFR calc Af Amer: >60 mL/min (ref >60)
GFR calc non Af Amer: >60 mL/min (ref >60)
GFR calc non Af Amer: >60 mL/min (ref >60)
GLUCOSE: 99 mg/dL (ref 65–99)
Glucose, Bld: 111 mg/dL — ABNORMAL HIGH (ref 65–99)
Glucose, Bld: 97 mg/dL (ref 65–99)
POTASSIUM: 3.7 mmol/L (ref 3.5–5.1)
Potassium: 3.5 mmol/L (ref 3.5–5.1)
Potassium: 3.4 mmol/L — ABNORMAL LOW (ref 3.5–5.1)
SODIUM: 129 mmol/L — AB (ref 135–145)
SODIUM: 129 mmol/L — AB (ref 135–145)
Sodium: 128 mmol/L — ABNORMAL LOW (ref 135–145)

## 2016-01-27 LAB — TSH: TSH: 1.347 u[IU]/mL (ref 0.350–4.500)

## 2016-01-27 LAB — MAGNESIUM: Magnesium: 1.8 mg/dL (ref 1.7–2.4)

## 2016-01-27 LAB — CBC
HEMATOCRIT: 35.1 % — AB (ref 36.0–46.0)
Hemoglobin: 12 g/dL (ref 12.0–15.0)
MCH: 32.6 pg (ref 26.0–34.0)
MCHC: 34.2 g/dL (ref 30.0–36.0)
MCV: 95.4 fL (ref 78.0–100.0)
Platelets: 235 10*3/uL (ref 150–400)
RBC: 3.68 MIL/uL — ABNORMAL LOW (ref 3.87–5.11)
RDW: 12.9 % (ref 11.5–15.5)
WBC: 8.3 10*3/uL (ref 4.0–10.5)

## 2016-01-27 LAB — AMMONIA: AMMONIA: 55 umol/L — AB (ref 9–35)

## 2016-01-27 LAB — PHOSPHORUS: Phosphorus: 3.1 mg/dL (ref 2.5–4.6)

## 2016-01-27 MED ORDER — CETYLPYRIDINIUM CHLORIDE 0.05 % MT LIQD
7.0000 mL | Freq: Two times a day (BID) | OROMUCOSAL | Status: DC
  Administered 2016-01-27 – 2016-01-30 (×7): 7 mL via OROMUCOSAL

## 2016-01-27 MED ORDER — PNEUMOCOCCAL VAC POLYVALENT 25 MCG/0.5ML IJ INJ
0.5000 mL | INJECTION | INTRAMUSCULAR | Status: DC
  Filled 2016-01-27: qty 0.5

## 2016-01-27 NOTE — Progress Notes (Signed)
PT Cancellation Note  Patient Details Name: Audrey Obrien MRN: [id number] DOB: 10-Jun-1931   Cancelled Treatment:    Reason Eval/Treat Not Completed: Fatigue/lethargy limiting ability to participate. Unable to arouse pt.   Simone Rodenbeck 01/27/2016, 11:20 AM Skip Mayer PT [phone]

## 2016-01-27 NOTE — Progress Notes (Signed)
[facility] PROGRESS NOTE  Audrey Obrien:[id number] DOB: 1931/05/03 DOA: 01/26/2016 PCP: Kristie Cowman, MD  Assessment/Plan: Active Problems: Metabolic encephalopathy - in context of patient with history of dementia and seizures found to be in a stiff position with bowel and bladder incontinence - could be multifactorial agree with H and P documented reasons.  - consulted Neurology and ordered EEG - Continue Rocephin for treatment of UTI  UTI - f/u with urine culture - may be contributing to principle problem above - continue Rocephin    Hyponatremia - stable, reportedly patient has chronic history of this - Continue normal saline administration - assess bmp q 8 hours    Dehydration - continue normal saline administration    HTN (hypertension) - Pt on losartan, will continue    Seizure disorder (HCC) - see principle problem above    Scleroderma (HCC) - Will continue pain medication for prn discomfort.    Pressure ulcer - woc consult  Code Status: dnr Family Communication: none at bedside tried to call with no answer  Disposition Plan: pending hospital course and tests   Consultants:  neurology  Procedures:  None  Antibiotics:  Rocephin  HPI/Subjective: Pt has no new complaints  Objective: Filed Vitals:   01/27/16 0636 01/27/16 0924  BP: 150/76 161/76  Pulse: 118 111  Temp: 98.2 F (36.8 C)   Resp: 18 18    Intake/Output Summary (Last 24 hours) at 01/27/16 1258 Last data filed at 01/27/16 0750  Gross per 24 hour  Intake   2295 ml  Output    150 ml  Net   2145 ml   Filed Weights   01/27/16 1253  Weight: 48.988 kg (108 lb)    Exam:   General:  Pt in nad, somnolent but arousable   Cardiovascular: rrr, no mrg  Respiratory: cta bl, no wheezes  Abdomen: soft, nd, nt  Musculoskeletal: equal tone BL on limited exam.  Data Reviewed: Basic Metabolic Panel:  Recent Labs Lab 01/26/16 1228 01/27/16 0720  NA 129* 128*  K  4.3 3.7  CL 96* 96*  CO2 21* 20*  GLUCOSE 92 99  BUN 11 10  CREATININE 0.78 0.72  CALCIUM 9.2 8.2*   Liver Function Tests:  Recent Labs Lab 01/26/16 1228  AST 23  ALT 11*  ALKPHOS 55  BILITOT 0.7  PROT 7.0  ALBUMIN 4.0   No results for input(s): LIPASE, AMYLASE in the last 168 hours. No results for input(s): AMMONIA in the last 168 hours. CBC:  Recent Labs Lab 01/26/16 1228 01/27/16 0720  WBC 5.2 8.3  NEUTROABS 3.8  --   HGB 12.0 12.0  HCT 34.2* 35.1*  MCV 96.3 95.4  PLT 215 235   Cardiac Enzymes:  Recent Labs Lab 01/26/16 2148  CKTOTAL 86   BNP (last 3 results) No results for input(s): BNP in the last 8760 hours.  ProBNP (last 3 results) No results for input(s): PROBNP in the last 8760 hours.  CBG: No results for input(s): GLUCAP in the last 168 hours.  No results found for this or any previous visit (from the past 240 hour(s)).   Studies: Dg Abd 1 View  01/26/2016  CLINICAL DATA:  Constipation. EXAM: ABDOMEN - 1 VIEW COMPARISON:  None. FINDINGS: The lung bases are grossly clear. There is a large amount of stool throughout the colon and down into the rectum suggesting constipation. No distended small bowel loops to suggest obstruction. The soft tissue shadows are grossly maintained. The bony structures  are grossly intact. Aortic calcifications are noted. IMPRESSION: Large amount of stool throughout the colon and down into the rectum suggesting constipation. Electronically Signed   By: Rudie Meyer M.D.   On: 01/26/2016 21:54   Dg Chest Port 1 View  01/26/2016  CLINICAL DATA:  Septic  uti   Pt unable  To  Communicate EXAM: PORTABLE CHEST - 1 VIEW COMPARISON:  08/29/2012 and previous FINDINGS: Progression of right upper lobe scattered airspace disease. Left lung remains clear. Heart size normal. Prominent pulmonary outflow tract. Atheromatous aortic arch. No effusion.  No pneumothorax. Visualized skeletal structures are unremarkable. IMPRESSION: 1. Worsening  right upper lobe airspace disease. Electronically Signed   By: Corlis Leak M.D.   On: 01/26/2016 12:38    Scheduled Meds: . antiseptic oral rinse  7 mL Mouth Rinse BID  . aspirin EC  81 mg Oral QHS  . cefTRIAXone (ROCEPHIN)  IV  1 g Intravenous Q24H  . enoxaparin (LOVENOX) injection  40 mg Subcutaneous Q24H  . losartan  100 mg Oral Daily  . [START ON 01/28/2016] pneumococcal 23 valent vaccine  0.5 mL Intramuscular Tomorrow-1000  . primidone  125 mg Oral Daily  . sodium chloride flush  3 mL Intravenous Q12H   Continuous Infusions: . sodium chloride 75 mL/hr (01/26/16 2114)    Time spent: > 25 minutes  Penny Pia  [facility] Pager [phone] If 7PM-7AM, please contact night-coverage at [email or web address], password [facility] 01/27/2016, 12:58 PM  LOS: 1 day

## 2016-01-27 NOTE — Progress Notes (Signed)
UR COMPLETED  

## 2016-01-27 NOTE — Progress Notes (Signed)
Initial Nutrition Assessment  DOCUMENTATION CODES:   Not applicable  INTERVENTION:   -RD will follow for diet advancement and supplement diet as appropriate  NUTRITION DIAGNOSIS:   Inadequate oral intake related to inability to eat as evidenced by NPO status.  GOAL:   Patient will meet greater than or equal to 90% of their needs  MONITOR:   Diet advancement, Labs, Weight trends, Skin, I & O's  REASON FOR ASSESSMENT:   Low Braden    ASSESSMENT:   Audrey Obrien is a 82 y.o. female with a Past Medical History ofHTN, scleroderma, CAD, dementia, right eye blindness who presents withacute encephalopathy likely multifactorial due to progressive underlying dementia, UTI, and possible seizure. Suspect respiratory status at baseline without direct evidence of pneumonia. Suspect patient with some underlying chronic scarring secondary to pulmonary fibrosis from scleroderma versus other chronic process.  Pt admitted with acute encephalopathy.   No family at bedside. Pt did not arouse when name was being called or during exam. RN reports that pt is extremely hard of hearing.  Per chart review, pt was not eating well PTA, however weight has been stable. Reviewed wt hx, which reveals wt has been stable over the past several years. Noted UBW around 110#.   Case discussed with RN. She reports that pt was alert this morning and consumed 75% of container of applesauce and a few sips of Ensure without difficulty. She is waiting on a diet order and RN states that pt will likely need soft consistency foods.   Nutrition-Focused physical exam completed. Findings are mild fat depletion, mild muscle depletion, and no edema. It is difficult to determine whether fat and muscle loss is related to limited mobility, suboptimal nutritional status, or advanced age, but this RD suspects it is multifactorial.   Labs reviewed: Na: 128 (on IV supplementation).   Diet Order:  Diet NPO time specified  Skin:   Wound (see comment) (stage I lt heel, MASD buttocks)  Last BM:  PTA  Height:   Ht Readings from Last 1 Encounters:  07/15/15  (1.626 m)    Weight:   Wt Readings from Last 1 Encounters:  12/18/15 108 lb (48.988 kg)    Ideal Body Weight:  54.5 kg  BMI:  Estimated body mass index is 18.53 kg/(m^2) as calculated from the following:   Height as of 07/15/15:  (1.626 m).   Weight as of 12/18/15: 108 lb (48.988 kg).  Estimated Nutritional Needs:   Kcal:  1250-1450  Protein:  55-65 grams  Fluid:  1.2-1.4 L  EDUCATION NEEDS:   No education needs identified at this time  Zilla Shartzer A. Mayford Knife, RD, LDN, CDE Pager: [phone] After hours Pager: [phone]

## 2016-01-27 NOTE — Progress Notes (Signed)
EEG Completed; Results Pending  

## 2016-01-27 NOTE — Progress Notes (Signed)
Husband at bedside.  

## 2016-01-27 NOTE — Consult Note (Addendum)
WOC wound consult note Reason for Consult: Consult requested for heels and buttocks.  Heels were previously noted to have stage 1 pressure injuries bilat, but today they are blanching.  Foam dressings have been applied to protect skin and heels are floated to reduce pressure. Wound type: Buttocks with partial thickness fissure; 1X.1X,1cm to upper gluteal fold near sacrum.  Pt is emaciated, frequently incontinent, and it is difficult to keep a dressing from becoming soiled; cleaned up stool and then pt was incontinent of more and cleaned location again. Pressure Ulcer POA: Yes; this was present on admission but is NOT a pressure injury Wound bed: Pink and moist Dressing procedure/placement/frequency: Barrier cream to protect and repel moisture.  Family at bedside to assess site and discuss plan of care. Please re-consult if further assistance is needed.  Thank-you,  Cammie Mcgee MSN, RN, CWOCN, [place], CNS [phone]

## 2016-01-27 NOTE — Consult Note (Signed)
NEURO HOSPITALIST CONSULT NOTE   Requestig physician: Dr. Cena Benton   Reason for Consult: possible seizure  HPI:                                                                                                                                          Audrey Obrien is an 82 y.o. female who currently cannot provider any history.  Per notes, "Patient rarely ambulates, she has to be fed by her husband, she speaks very little. Brought to ED today after husband found her to be difficult to arouse this am. She also seemed to be in a "stiff position". Over the last five days she has become incontinent of urine and also stool. No diarrhea, in fact husband describes hard stools. He endorses a hacking cough. Patient drinks very little. Her appetite isn't great but she hasn't really lost weight. She has been on Cipro for a few days for a UTI."  Palliative care has been consulted.   Currently being treated with rocephin but was on Cipro for UTI at home. Cipro is known to decrease seizure threshold.  She was then given one does of cefepime in hospital (also known to decrease seizure threshold) but now on Rocephin.   Patient has been seen by [facility] neurology back in 2015 --at that "She was initially seen by neurology inpatient in March 2013 as a code stroke, which ended up to be due to seizure. She had 3 such seizures noted during her code stroke evaluation. She underwent MRI brain which was normal. She was placed on cEEG which showed background slow and poor organization. She was started on dilantin  qday. Work-up showed an E.coli UTI and a Sodium of 125 which could have been provoking factors, though these were new onset seizures. Since her hospitalization she underwent repeat EEG (also in March 2013) that was normal awake and asleep. Due to no further seizure on Dilantin she was tapered off Dilantin.   EEG and MRI pending  Past Medical History  Diagnosis Date  . Hypertension   . Acne rosacea    . Chronic rhinitis     no benefit from antihistamines  . Gallbladder & bile duct stone 2007    1.6 cm GB stone; no symptoms  . Pneumonia 2007  . 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 C. difficile colitis 11/07  . Spastic dysphonia     botox injs at [facility]  . Hypertension   . Scleroderma (HCC)     probable, ANA+ 1:160, anticentromers strongly pos, USRNP-, TH/TO-,RF equivocal, CCP-, Skin puffiness of hands, telangectasias of hands, raynauds, ECHO increased pulmonary pressures 8/11, pfts stable  . S/P right heart catheterization 10/11  . Benign essential tremor   .  Osteoporosis     started alendronate 6/12  . Glaucoma   . Spastic dysphonia     Get Botox at [facility]  . Benign essential tremor   . Dementia   . Blindness     per husband, both eyes  . Raynaud disease     Past Surgical History  Procedure Laterality Date  . Lung surgery  1982  . Corneal transplant      Family History  Problem Relation Age of Onset  . Asthma Son     Social History:  reports that she has never smoked. She has never used smokeless tobacco. She reports that she does not drink alcohol or use illicit drugs.  No Known Allergies  MEDICATIONS:                                                                                                                     Prior to Admission:  Prescriptions prior to admission  Medication Sig Dispense Refill Last Dose  . aspirin 81 MG tablet Take 81 mg by mouth at bedtime.    01/25/2016 at Unknown time  . ciprofloxacin (CIPRO) 500 MG tablet Take 500 mg by mouth 2 (two) times daily.   01/26/2016 at Unknown time  . estradiol (ESTRACE VAGINAL) 0.1 MG/GM vaginal cream Place 1 application vaginally 2 (two) times a week.   Past Week at Unknown time  . HYDROcodone-homatropine (HYCODAN) 5-1.5 MG/5ML syrup Take 5 mLs by mouth every 6 (six) hours as needed for cough. 240 mL 0 01/25/2016 at Unknown time  . loratadine (CLARITIN) 10 MG tablet Take 1 tablet  (10 mg total) by mouth daily. 90 tablet 3 01/26/2016 at Unknown time  . losartan (COZAAR) 100 MG tablet Take 100 mg by mouth daily.    01/26/2016 at Unknown time  . neomycin-polymyxin b-dexamethasone (MAXITROL) 3.5-10000-0.1 OINT Place 1 application into the left eye at bedtime.   01/25/2016 at Unknown time  . primidone (MYSOLINE) 250 MG tablet Take 125 mg by mouth daily. Takes 1/2 tab daily   01/26/2016 at Unknown time  . trimethoprim (TRIMPEX) 100 MG tablet Take 100 mg by mouth daily.   01/26/2016 at Unknown time   Scheduled: . antiseptic oral rinse  7 mL Mouth Rinse BID  . aspirin EC  81 mg Oral QHS  . cefTRIAXone (ROCEPHIN)  IV  1 g Intravenous Q24H  . enoxaparin (LOVENOX) injection  40 mg Subcutaneous Q24H  . losartan  100 mg Oral Daily  . [START ON 01/28/2016] pneumococcal 23 valent vaccine  0.5 mL Intramuscular Tomorrow-1000  . primidone  125 mg Oral Daily  . sodium chloride flush  3 mL Intravenous Q12H     ROS:  History obtained from husband.   Blood pressure 161/76, pulse 111, temperature 98.2 F (36.8 C), temperature source Oral, resp. rate 18, SpO2 100 %.   Neurologic Examination:                                                                                                      HEENT-  Normocephalic, no lesions, without obvious abnormality.  Normal external eye and conjunctiva.  Normal TM's bilaterally.  Normal auditory canals and external ears. Normal external nose, mucus membranes and septum.  Normal pharynx. Cardiovascular- pulses palpable throughout   Lungs- chest clear, no wheezing, rales, normal symmetric air entry Lymph-no adenopathy palpable Musculoskeletal-no joint tenderness, deformity or swelling  Neurological Examination Mental Status: She is in bed, only responds to pain full stimuli and localizes to pain with the right arm.   Cranial Nerves: II:  Holds eyes shut, unable to visualize left pupil and right pupil is fixed at 2 mm (bling in right eye),  III,IV, VI: difficult to visual due to patient clenching eyes shut.  V,VII: grimace is symmetric and winces to pain bilaterally. Could not obtain a corneal due to patient cooperation.  VIII: unable assess IX,X: unable to visualize XI: unable to obtain XII: unable to obtain.  Motor: Both upper extremities have a postural tremor when arms lift, able to move both arms antigravity and localizes to pain with the right arm. No resting tremor noted. Bilateral arm do have increased tone. Bilateral legs have increased tone and left leg has tremor at rest. Does not lift legs off the bed to pain but able to move side to side on the bed.  Sensory: winces to noxious stimuli in both legs.  Deep Tendon Reflexes: 2+ and symmetric throughout UE and KJ no AJ Plantars: equivocal bialterally Cerebellar: unable to assess Gait: not tested  Lab Results: Basic Metabolic Panel:  Recent Labs Lab 01/26/16 1228 01/27/16 0720  NA 129* 128*  K 4.3 3.7  CL 96* 96*  CO2 21* 20*  GLUCOSE 92 99  BUN 11 10  CREATININE 0.78 0.72  CALCIUM 9.2 8.2*    Liver Function Tests:  Recent Labs Lab 01/26/16 1228  AST 23  ALT 11*  ALKPHOS 55  BILITOT 0.7  PROT 7.0  ALBUMIN 4.0   No results for input(s): LIPASE, AMYLASE in the last 168 hours. No results for input(s): AMMONIA in the last 168 hours.  CBC:  Recent Labs Lab 01/26/16 1228 01/27/16 0720  WBC 5.2 8.3  NEUTROABS 3.8  --   HGB 12.0 12.0  HCT 34.2* 35.1*  MCV 96.3 95.4  PLT 215 235    Cardiac Enzymes:  Recent Labs Lab 01/26/16 2148  CKTOTAL 86    Lipid Panel: No results for input(s): CHOL, TRIG, HDL, CHOLHDL, VLDL, LDLCALC in the last 168 hours.  CBG: No results for input(s): GLUCAP in the last 168 hours.  Microbiology: Results for orders placed or performed during the hospital encounter of 04/04/12   Urine culture     Status: None   Collection Time: 04/16/12 10:39 AM  Result Value  Ref Range Status   Specimen Description URINE, CLEAN CATCH  Final   Special Requests NONE  Final   Culture  Setup Time [id number]  Final   Colony Count >=100,000 COLONIES/ML  Final   Culture ESCHERICHIA COLI  Final   Report Status 04/19/2012 FINAL  Final   Organism ID, Bacteria ESCHERICHIA COLI  Final      Susceptibility   Escherichia coli - MIC*    AMPICILLIN >=32 Resistant     CEFAZOLIN <=4 Sensitive     CEFTRIAXONE <=1 Sensitive     CIPROFLOXACIN >=4 Resistant     GENTAMICIN <=1 Sensitive     LEVOFLOXACIN >=8 Resistant     NITROFURANTOIN <=16 Sensitive     TOBRAMYCIN <=1 Sensitive     TRIMETH/SULFA >=320 Resistant     PIP/TAZO <=4 Sensitive     * ESCHERICHIA COLI    Coagulation Studies: No results for input(s): LABPROT, INR in the last 72 hours.  Imaging: Dg Abd 1 View  01/26/2016  CLINICAL DATA:  Constipation. EXAM: ABDOMEN - 1 VIEW COMPARISON:  None. FINDINGS: The lung bases are grossly clear. There is a large amount of stool throughout the colon and down into the rectum suggesting constipation. No distended small bowel loops to suggest obstruction. The soft tissue shadows are grossly maintained. The bony structures are grossly intact. Aortic calcifications are noted. IMPRESSION: Large amount of stool throughout the colon and down into the rectum suggesting constipation. Electronically Signed   By: Rudie Meyer M.D.   On: 01/26/2016 21:54   Dg Chest Port 1 View  01/26/2016  CLINICAL DATA:  Septic  uti   Pt unable  To  Communicate EXAM: PORTABLE CHEST - 1 VIEW COMPARISON:  08/29/2012 and previous FINDINGS: Progression of right upper lobe scattered airspace disease. Left lung remains clear. Heart size normal. Prominent pulmonary outflow tract. Atheromatous aortic arch. No effusion.  No pneumothorax. Visualized skeletal structures are unremarkable. IMPRESSION: 1. Worsening right upper lobe  airspace disease. Electronically Signed   By: Corlis Leak M.D.   On: 01/26/2016 12:38    History and examination documented by Felicie Morn PA-C, [facility], [phone] 01/27/2016, 11:48 AM   Assessment/Plan: 1. Consult for possible seizure. She has a prior history of seizures during a hospitalization 4 years ago. However, the description of her most recent event, as described by her husband, is more consistent with AMS in conjunction with tremor/rigidity rather than seizure.  2. History of gradually declining cognition. Husband endorses a history of cognitive fluctuations, formed visual hallucinations, gradually worsening gait with shuffling, progressive stiffness and a tendency to fall backwards. The constellation of described symptoms as well as the resting tremor and cogwheel rigidity seen on exam are most consistent with a Parkinsonian syndrome, most likely Lewy body dementia (LBD).  3. Hyponatremia and UTI. Both can contribute to AMS in patients with decreased neurological reserve.   Recommendations: 1. Agree with obtaining EEG and MRI.  2. Will need outpatient dementia evaluation at [facility].  3. Avoid neuroloptics, which can worsen cognition and motor function in patients with LBD.   4. Consider a trial of Aricept.  5. Gentle rehydration.  6. PT/OT/Speech.   Caryl Pina, MD

## 2016-01-28 DIAGNOSIS — Z515 Encounter for palliative care: Secondary | ICD-10-CM

## 2016-01-28 DIAGNOSIS — J189 Pneumonia, unspecified organism: Secondary | ICD-10-CM

## 2016-01-28 DIAGNOSIS — Z66 Do not resuscitate: Secondary | ICD-10-CM

## 2016-01-28 DIAGNOSIS — R404 Transient alteration of awareness: Secondary | ICD-10-CM

## 2016-01-28 LAB — PROCALCITONIN: PROCALCITONIN: 0.1 ng/mL

## 2016-01-28 LAB — BASIC METABOLIC PANEL
Anion gap: 15 (ref 5–15)
Anion gap: 12 (ref 5–15)
BUN: 6 mg/dL (ref 6–20)
BUN: 7 mg/dL (ref 6–20)
CALCIUM: 8.6 mg/dL — AB (ref 8.9–10.3)
CO2: 19 mmol/L — ABNORMAL LOW (ref 22–32)
CO2: 23 mmol/L (ref 22–32)
CREATININE: 0.76 mg/dL (ref 0.44–1.00)
CREATININE: 0.73 mg/dL (ref 0.44–1.00)
Calcium: 8.8 mg/dL — ABNORMAL LOW (ref 8.9–10.3)
Chloride: 96 mmol/L — ABNORMAL LOW (ref 101–111)
Chloride: 93 mmol/L — ABNORMAL LOW (ref 101–111)
GFR calc Af Amer: >60 mL/min (ref >60)
GFR calc Af Amer: >60 mL/min (ref >60)
GLUCOSE: 79 mg/dL (ref 65–99)
GLUCOSE: 152 mg/dL — AB (ref 65–99)
POTASSIUM: 3 mmol/L — AB (ref 3.5–5.1)
Potassium: 3.1 mmol/L — ABNORMAL LOW (ref 3.5–5.1)
SODIUM: 130 mmol/L — AB (ref 135–145)
SODIUM: 128 mmol/L — AB (ref 135–145)

## 2016-01-28 MED ORDER — POTASSIUM CHLORIDE CRYS ER 20 MEQ PO TBCR
40.0000 meq | EXTENDED_RELEASE_TABLET | Freq: Once | ORAL | Status: AC
  Administered 2016-01-28: 40 meq via ORAL
  Filled 2016-01-28: qty 2

## 2016-01-28 MED ORDER — CEPHALEXIN 250 MG PO CAPS
250.0000 mg | ORAL_CAPSULE | Freq: Two times a day (BID) | ORAL | Status: DC
  Administered 2016-01-28 – 2016-01-30 (×5): 250 mg via ORAL
  Filled 2016-01-28 (×5): qty 1

## 2016-01-28 NOTE — Evaluation (Signed)
Physical Therapy Evaluation Patient Details Name: Audrey Obrien MRN: [id number] DOB: 1931/08/27 Today's Date: 01/28/2016   History of Present Illness  y.o. female with a Past Medical History of HTN, scleroderma, CAD, dementia, right eye blindness who presents withacute encephalopathy likely multifactorial due to progressive underlying dementia, UTI, and possible seizure.  Clinical Impression  Pt admitted with above complications. Pt currently with functional limitations due to the deficits listed below (see PT Problem List). Limited assessment due to lack of participation at times, lethargic and intermittently agitated. Pt required max assist for bed mobility and refused to attempt transfer. Able to state name and birth date, but does not answer when questioned about location, date, and situation. No family immediately available to determine prior level of function however from previous note pt was ambulatory for short distances but required assist with feeding and other basic ADLs. Pt will benefit from skilled PT to increase their independence and safety with mobility to allow discharge to the venue listed below.       Follow Up Recommendations SNF;Supervision/Assistance - 24 hour - If family decides to take pt home, HHPT will depend on prior level of function, which we have been unable to confirm from husband.    Equipment Recommendations  None recommended by PT    Recommendations for Other Services       Precautions / Restrictions Precautions Precautions: Fall Restrictions Weight Bearing Restrictions: No      Mobility  Bed Mobility Overal bed mobility: Needs Assistance Bed Mobility: Supine to Sit;Sit to Supine     Supine to sit: Max assist Sit to supine: Max assist   General bed mobility comments: Pt very rigid, assists very minimally. Max VC for technique. Only pushing with LUE, and resisting at times with RUE. Max assist for trunk and LE support to sit EOB. Pt able to pull LEs  back into bed with max VC and faciliatatory cues. Agitated with movement. Attempting to lie back down on several occasions despite encouragement to sit up for a longer period of time. Does not open eyes but speaks intermittently.  Transfers                 General transfer comment: Refused  Ambulation/Gait                Stairs            Wheelchair Mobility    Modified Rankin (Stroke Patients Only)       Balance Overall balance assessment: Needs assistance Sitting-balance support: Single extremity supported Sitting balance-Leahy Scale: Poor Sitting balance - Comments: Holds bed with single UE. agitated and therefore unable to assess fully. Sat unsupported at times but only briefly. Postural control: Posterior lean                                   Pertinent Vitals/Pain Pain Assessment:  (PAINAD 2/10 - States "no" when asked if in pain) Faces Pain Scale: No hurt    Home Living Family/patient expects to be discharged to:: Unsure Living Arrangements: Spouse/significant other             Home Equipment:  (unsure) Additional Comments: Patient is unable to provide history. No family immediately available.    Prior Function Level of Independence:  (Pt does not answer)         Comments: Patient is unable to provide history. No family immediately available. From prior note,  pt amb short distances with RW and requires assist with ADLs including feeding.     Hand Dominance   Dominant Hand:  (Pt will not answer)    Extremity/Trunk Assessment   Upper Extremity Assessment: Defer to OT evaluation           Lower Extremity Assessment: Difficult to assess due to impaired cognition         Communication   Communication: No difficulties  Cognition Arousal/Alertness: Lethargic Behavior During Therapy: Flat affect;Agitated Overall Cognitive Status: No family/caregiver present to determine baseline cognitive functioning (Hx of  dementia)                      General Comments General comments (skin integrity, edema, etc.): No family immediately available. RN unsure of pts baseline function.    Exercises        Assessment/Plan    PT Assessment Patient needs continued PT services  PT Diagnosis Difficulty walking;Altered mental status   PT Problem List Decreased strength;Decreased activity tolerance;Decreased balance;Decreased mobility;Decreased cognition;Decreased knowledge of use of DME;Decreased safety awareness  PT Treatment Interventions DME instruction;Gait training;Functional mobility training;Therapeutic activities;Balance training;Therapeutic exercise;Neuromuscular re-education;Cognitive remediation;Patient/family education   PT Goals (Current goals can be found in the Care Plan section) Acute Rehab PT Goals Patient Stated Goal: none stated PT Goal Formulation: Patient unable to participate in goal setting Time For Goal Achievement: 02/11/16 Potential to Achieve Goals: Fair    Frequency Min 2X/week   Barriers to discharge  (unsure of support)      Co-evaluation               End of Session   Activity Tolerance: Patient limited by lethargy;Treatment limited secondary to agitation Patient left: in bed;with call bell/phone within reach;with bed alarm set Nurse Communication: Mobility status         Time: 1610-9604 PT Time Calculation (min) (ACUTE ONLY): 9 min   Charges:   PT Evaluation $PT Eval High Complexity: 1 Procedure     PT G CodesBerton Mount 01/28/2016, 11:23 AM Charlsie Merles, PT [phone]

## 2016-01-28 NOTE — Progress Notes (Signed)
PROGRESS NOTE  Audrey Obrien ZOX:[id number] DOB: 20-May-1931 DOA: 01/26/2016 PCP: Kristie Cowman, MD Outpatient Specialists:    LOS: 2 days   Brief Narrative: 82 year old female with hypertension, scleroderma, coronary artery disease, dementia, with came in with acute encephalopathy and was admitted on 2/20  Assessment & Plan: Active Problems:   Hyponatremia   Dehydration   HTN (hypertension)   Seizure disorder (HCC)   Scleroderma (HCC)   Abnormal urinalysis   Bronchiectasis (HCC)   Unresponsiveness   Dementia   Pressure ulcer   Acute encephalopathy - poor baseline to begin with, she has poor functional status - likely related to urinary tract infection as well as underlying dementia  - She is also very hard of hearing as well as blind making communication is difficult - MRI of the brain is pending, EEG is pending - Appreciate neurology   Urinary tract infection  - Patient initially started on Rocephin, urine culture speciated to Escherichia coli which is sensitive to cefazolin, narrowed to Keflex today   Hyponatremia - stable, reportedly patient has chronic history of this  HTN (hypertension) - Pt on losartan, will continue  Seizure disorder (HCC) - EEG is pending  Scleroderma (HCC) - Will continue pain medication for prn discomfort.  Pressure ulcer - woc consult  Questionable infiltrate on the chest x-ray - This is chronic, seen for a number of years on her CT scan of the chest -  No respiratory symptoms, unlikely to be an active infection   DVT prophylaxis: Lovenox Code Status: DNR Family Communication: no family bedside Disposition Plan: SNF Barriers for discharge: MRI, EEG  Consultants:   Neurology   Procedures:   EEG: pending  Antimicrobials:     Subjective: - HOH, but denies complaints. Sleepy.  Objective: Filed Vitals:   01/27/16 2138 01/28/16 0557 01/28/16 0951 01/28/16 1403  BP: 145/75 157/69 149/56 143/78  Pulse: 98 98 102 102    Temp: 99.4 F (37.4 C) 98.2 F (36.8 C)  98.4 F (36.9 C)  TempSrc: Oral Oral    Resp: 20 18  19   Height:      Weight:      SpO2: 97% 98% 98%     Intake/Output Summary (Last 24 hours) at 01/28/16 1530 Last data filed at 01/28/16 8119  Gross per 24 hour  Intake    790 ml  Output      0 ml  Net    790 ml   Filed Weights   01/27/16 1253  Weight: 48.988 kg (108 lb)    Examination: BP 143/78 mmHg  Pulse 102  Temp(Src) 98.4 F (36.9 C) (Oral)  Resp 19  Ht 5\' 4"  (1.626 m)  Wt 48.988 kg (108 lb)  BMI 18.53 kg/m2  SpO2 98% General exam: NAD Respiratory system: Clear. No increased work of breathing. No wheezing, no crackles Cardiovascular system: regular rate and rhythm, no murmurs, gallops. No JVD. No peripheral edema.  Gastrointestinal system: Abdomen is nondistended, soft and nontender. Normal bowel sounds heard. Central nervous system: AxOx1. Non focal  Extremities: No clubbing/cyanosis Skin: no rashes   Data Reviewed: I have personally reviewed following labs and imaging studies  CBC:  Recent Labs Lab 01/26/16 1228 01/27/16 0720  WBC 5.2 8.3  NEUTROABS 3.8  --   HGB 12.0 12.0  HCT 34.2* 35.1*  MCV 96.3 95.4  PLT 215 235   Basic Metabolic Panel:  Recent Labs Lab 01/27/16 0720 01/27/16 1457 01/27/16 2100 01/28/16 0707 01/28/16 1409  NA 128* 129* 129*  130* 128*  K 3.7 3.5 3.4* 3.1* 3.0*  CL 96* 97* 97* 96* 93*  CO2 20* 19* 18* 19* 23  GLUCOSE 99 111* 97 79 152*  BUN CREATININE 0.72 0.73 0.73 0.76 0.73  CALCIUM 8.2* 8.7* 8.4* 8.6* 8.8*  MG  --  1.8  --   --   --   PHOS  --  3.1  --   --   --    GFR: Estimated Creatinine Clearance: 41.2 mL/min (by C-G formula based on Cr of 0.73). Liver Function Tests:  Recent Labs Lab 01/26/16 1228  AST 23  ALT 11*  ALKPHOS 55  BILITOT 0.7  PROT 7.0  ALBUMIN 4.0   No results for input(s): LIPASE, AMYLASE in the last 168 hours.  Recent Labs Lab 01/27/16 1457  AMMONIA 55*    Coagulation Profile: No results for input(s): INR, PROTIME in the last 168 hours. Cardiac Enzymes:  Recent Labs Lab 01/26/16 2148  CKTOTAL 86   BNP (last 3 results) No results for input(s): PROBNP in the last 8760 hours. HbA1C: No results for input(s): HGBA1C in the last 72 hours. CBG: No results for input(s): GLUCAP in the last 168 hours. Lipid Profile: No results for input(s): CHOL, HDL, LDLCALC, TRIG, CHOLHDL, LDLDIRECT in the last 72 hours. Thyroid Function Tests:  Recent Labs  01/27/16 1457  TSH 1.347   Anemia Panel: No results for input(s): VITAMINB12, FOLATE, FERRITIN, TIBC, IRON, RETICCTPCT in the last 72 hours. Urine analysis:    Component Value Date/Time   COLORURINE YELLOW 01/26/2016 1200   APPEARANCEUR CLEAR 01/26/2016 1200   LABSPEC 1.013 01/26/2016 1200   PHURINE 6.0 01/26/2016 1200   GLUCOSEU NEGATIVE 01/26/2016 1200   HGBUR NEGATIVE 01/26/2016 1200   BILIRUBINUR NEGATIVE 01/26/2016 1200   KETONESUR 15* 01/26/2016 1200   PROTEINUR NEGATIVE 01/26/2016 1200   UROBILINOGEN 0.2 04/16/2012 1038   NITRITE POSITIVE* 01/26/2016 1200   LEUKOCYTESUR MODERATE* 01/26/2016 1200   Sepsis Labs: Invalid input(s): PROCALCITONIN, LACTICIDVEN  Recent Results (from the past 240 hour(s))  Urine culture     Status: None (Preliminary result)   Collection Time: 01/26/16 12:00 PM  Result Value Ref Range Status   Specimen Description URINE, CATHETERIZED  Final   Special Requests NONE  Final   Culture >=100,000 COLONIES/mL ESCHERICHIA COLI  Final   Report Status PENDING  Incomplete   Organism ID, Bacteria ESCHERICHIA COLI  Final      Susceptibility   Escherichia coli - MIC*    AMPICILLIN >=32 RESISTANT Resistant     CEFAZOLIN 8 SENSITIVE Sensitive     CEFTRIAXONE <=1 SENSITIVE Sensitive     CIPROFLOXACIN >=4 RESISTANT Resistant     GENTAMICIN <=1 SENSITIVE Sensitive     IMIPENEM <=0.25 SENSITIVE Sensitive     NITROFURANTOIN <=16 SENSITIVE Sensitive      TRIMETH/SULFA >=320 RESISTANT Resistant     AMPICILLIN/SULBACTAM 16 INTERMEDIATE Intermediate     PIP/TAZO <=4 SENSITIVE Sensitive     * >=100,000 COLONIES/mL ESCHERICHIA COLI  Blood Culture (routine x 2)     Status: None (Preliminary result)   Collection Time: 01/26/16 12:27 PM  Result Value Ref Range Status   Specimen Description BLOOD RIGHT ANTECUBITAL  Final   Special Requests IN PEDIATRIC BOTTLE 4CC  Final   Culture NO GROWTH 2 DAYS  Final   Report Status PENDING  Incomplete  Blood Culture (routine x 2)     Status: None (Preliminary result)   Collection Time:  01/26/16 12:27 PM  Result Value Ref Range Status   Specimen Description BLOOD LEFT HAND  Final   Special Requests IN PEDIATRIC BOTTLE 4CC  Final   Culture NO GROWTH 2 DAYS  Final   Report Status PENDING  Incomplete      Radiology Studies: Dg Abd 1 View  01/26/2016  CLINICAL DATA:  Constipation. EXAM: ABDOMEN - 1 VIEW COMPARISON:  None. FINDINGS: The lung bases are grossly clear. There is a large amount of stool throughout the colon and down into the rectum suggesting constipation. No distended small bowel loops to suggest obstruction. The soft tissue shadows are grossly maintained. The bony structures are grossly intact. Aortic calcifications are noted. IMPRESSION: Large amount of stool throughout the colon and down into the rectum suggesting constipation. Electronically Signed   By: Rudie Meyer M.D.   On: 01/26/2016 21:54     Scheduled Meds: . antiseptic oral rinse  7 mL Mouth Rinse BID  . aspirin EC  81 mg Oral QHS  . cephALEXin  250 mg Oral Q12H  . enoxaparin (LOVENOX) injection  40 mg Subcutaneous Q24H  . losartan  100 mg Oral Daily  . pneumococcal 23 valent vaccine  0.5 mL Intramuscular Tomorrow-1000  . primidone  125 mg Oral Daily  . sodium chloride flush  3 mL Intravenous Q12H   Continuous Infusions:     Pamella Pert, MD, PhD [facility] Pager [phone]  If 7PM-7AM, please contact  night-coverage [email or web address] Password [facility] 01/28/2016, 3:30 PM

## 2016-01-28 NOTE — Progress Notes (Signed)
Subjective: No significant changes over night. Patient is able to tell me "I am fine" and " I am in the bed" she then falls asleep.   Objective: Current vital signs: BP 157/69 mmHg  Pulse 98  Temp(Src) 98.2 F (36.8 C) (Oral)  Resp 18  Ht 5\' 4"  (1.626 m)  Wt 48.988 kg (108 lb)  BMI 18.53 kg/m2  SpO2 98% Vital signs in last 24 hours: Temp:  [98.2 F (36.8 C)-99.4 F (37.4 C)] 98.2 F (36.8 C) (02/22 0557) Pulse Rate:  [98-111] 98 (02/22 0557) Resp:  [18-20] 18 (02/22 0557) BP: (145-163)/(69-88) 157/69 mmHg (02/22 0557) SpO2:  [91 %-98 %] 98 % (02/22 0557) Weight:  [48.988 kg (108 lb)] 48.988 kg (108 lb) (02/21 1253)  Intake/Output from previous day: 02/21 0701 - 02/22 0700 In: 1635 [I.V.:1585; IV Piggyback:50] Out: -  Intake/Output this shift:   Nutritional status: Diet NPO time specified  Neurologic Exam: Mental Status: She is in bed, opens eyes to voice and states "I am fine" will not follow verbal commands.   Cranial Nerves: II: Holds eyes shut, unable to visualize left pupil and right pupil is fixed at 2 mm (bling in right eye),  III,IV, VI: difficult to visual due to patient clenching eyes shut.  V,VII: grimace is symmetric and winces to pain bilaterally. VIII: unable assess IX,X: unable to visualize XI: unable to obtain XII: unable to obtain.  Motor: Both upper extremities have a postural tremor when arms lift, able to move both arms antigravity and localizes to pain with the right arm. prefers to hold arms crossed in front of her. No resting tremor noted. Bilateral arm do have increased tone. Bilateral legs have increased tone and left leg has tremor at rest. Does not lift legs off the bed to pain but able to move side to side on the bed.  Sensory: winces to noxious stimuli in both legs.  Deep Tendon Reflexes: 2+ and symmetric throughout UE and KJ no AJ Plantars: equivocal bialterally Cerebellar: unable to assess Gait: not tested  Lab Results: Basic  Metabolic Panel:  Recent Labs Lab 01/26/16 1228 01/27/16 0720 01/27/16 1457 01/27/16 2100 01/28/16 0707  NA 129* 128* 129* 129* 130*  K 4.3 3.7 3.5 3.4* 3.1*  CL 96* 96* 97* 97* 96*  CO2 21* 20* 19* 18* 19*  GLUCOSE 92 99 111* 97 79  BUN 11 10 10 8 6   CREATININE 0.78 0.72 0.73 0.73 0.76  CALCIUM 9.2 8.2* 8.7* 8.4* 8.6*  MG  --   --  1.8  --   --   PHOS  --   --  3.1  --   --     Liver Function Tests:  Recent Labs Lab 01/26/16 1228  AST 23  ALT 11*  ALKPHOS 55  BILITOT 0.7  PROT 7.0  ALBUMIN 4.0   No results for input(s): LIPASE, AMYLASE in the last 168 hours.  Recent Labs Lab 01/27/16 1457  AMMONIA 55*    CBC:  Recent Labs Lab 01/26/16 1228 01/27/16 0720  WBC 5.2 8.3  NEUTROABS 3.8  --   HGB 12.0 12.0  HCT 34.2* 35.1*  MCV 96.3 95.4  PLT 215 235    Cardiac Enzymes:  Recent Labs Lab 01/26/16 2148  CKTOTAL 86    Lipid Panel: No results for input(s): CHOL, TRIG, HDL, CHOLHDL, VLDL, LDLCALC in the last 168 hours.  CBG: No results for input(s): GLUCAP in the last 168 hours.  Microbiology: Results for orders placed or  performed during the hospital encounter of 01/26/16  Urine culture     Status: None (Preliminary result)   Collection Time: 01/26/16 12:00 PM  Result Value Ref Range Status   Specimen Description URINE, CATHETERIZED  Final   Special Requests NONE  Final   Culture >=100,000 COLONIES/mL GRAM NEGATIVE RODS  Final   Report Status PENDING  Incomplete  Blood Culture (routine x 2)     Status: None (Preliminary result)   Collection Time: 01/26/16 12:27 PM  Result Value Ref Range Status   Specimen Description BLOOD RIGHT ANTECUBITAL  Final   Special Requests IN PEDIATRIC BOTTLE 4CC  Final   Culture NO GROWTH 1 DAY  Final   Report Status PENDING  Incomplete  Blood Culture (routine x 2)     Status: None (Preliminary result)   Collection Time: 01/26/16 12:27 PM  Result Value Ref Range Status   Specimen Description BLOOD LEFT HAND   Final   Special Requests IN PEDIATRIC BOTTLE 4CC  Final   Culture NO GROWTH 1 DAY  Final   Report Status PENDING  Incomplete    Coagulation Studies: No results for input(s): LABPROT, INR in the last 72 hours.  Imaging: Dg Abd 1 View  01/26/2016  CLINICAL DATA:  Constipation. EXAM: ABDOMEN - 1 VIEW COMPARISON:  None. FINDINGS: The lung bases are grossly clear. There is a large amount of stool throughout the colon and down into the rectum suggesting constipation. No distended small bowel loops to suggest obstruction. The soft tissue shadows are grossly maintained. The bony structures are grossly intact. Aortic calcifications are noted. IMPRESSION: Large amount of stool throughout the colon and down into the rectum suggesting constipation. Electronically Signed   By: Rudie Meyer M.D.   On: 01/26/2016 21:54   Dg Chest Port 1 View  01/26/2016  CLINICAL DATA:  Septic  uti   Pt unable  To  Communicate EXAM: PORTABLE CHEST - 1 VIEW COMPARISON:  08/29/2012 and previous FINDINGS: Progression of right upper lobe scattered airspace disease. Left lung remains clear. Heart size normal. Prominent pulmonary outflow tract. Atheromatous aortic arch. No effusion.  No pneumothorax. Visualized skeletal structures are unremarkable. IMPRESSION: 1. Worsening right upper lobe airspace disease. Electronically Signed   By: Corlis Leak M.D.   On: 01/26/2016 12:38    Medications:  Scheduled: . antiseptic oral rinse  7 mL Mouth Rinse BID  . aspirin EC  81 mg Oral QHS  . cefTRIAXone (ROCEPHIN)  IV  1 g Intravenous Q24H  . enoxaparin (LOVENOX) injection  40 mg Subcutaneous Q24H  . losartan  100 mg Oral Daily  . pneumococcal 23 valent vaccine  0.5 mL Intramuscular Tomorrow-1000  . primidone  125 mg Oral Daily  . sodium chloride flush  3 mL Intravenous Q12H    Assessment/Plan:  1. Consult for possible seizure. She has a prior history of seizures during a hospitalization 4 years ago. However, the description of her  most recent event, as described by her husband, is more consistent with AMS in conjunction with tremor/rigidity rather than seizure.  2. History of gradually declining cognition. Husband endorses a history of cognitive fluctuations, formed visual hallucinations, gradually worsening gait with shuffling, progressive stiffness and a tendency to fall backwards. The constellation of described symptoms as well as the resting tremor and cogwheel rigidity seen on exam are most consistent with a Parkinsonian syndrome, most likely Lewy body dementia (LBD).  3. Hyponatremia and UTI. Both can contribute to AMS in patients with decreased neurological  reserve.   Recommendations: 1. Agree with obtaining EEG and MRI. --Both are pending at this time. --if both are normal would not recommend further testing in hospital 2. Will need outpatient dementia evaluation at [facility].  3. Avoid neuroloptics, which can worsen cognition and motor function in patients with LBD.  4. Consider a trial of Aricept.  5. Gentle rehydration.  6. PT/OT/Speech.        Felicie Morn PA-C [facility] [phone]  01/28/2016, 9:11 AM

## 2016-01-28 NOTE — Procedures (Signed)
ELECTROENCEPHALOGRAM REPORT  Patient: Audrey Obrien       Room #: 1O10 EEG No. ID: [id number] Age: 82 y.o.        Sex: female Referring Physician: Wendy Poet Report Date:  01/28/2016        Interpreting Physician: Aline Brochure  History: [name] is an 82 y.o. female who presented with altered mental status with obtundation. Patient reportedly was incontinent of urine and stool for the previous 5 days. She was being treated for urinary tract infection with Cipro. There is a history of seizure activity in 2015, following which she was treated with Dilantin. Dilantin was subsequently discontinued following a normal workup.  Indications for study:  Assess severity of encephalopathy; rule out seizure activity.   Technique: This is an 18 channel routine scalp EEG performed at the bedside with bipolar and monopolar montages arranged in accordance to the international 10/20 system of electrode placement.   Description: Patient was noted to be unresponsive and tremulous during this EEG recording. Predominant activity consisted of low amplitude diffuse symmetrical delta activity with superimposed 5-6 Hz theta activity diffusely. Frequent moderately high amplitude rhythmic delta waves were also recorded. Photic stimulation was not performed. Rhythmic muscle activity was noted intermittently, consistent with patient's tremulous state. No epileptiform discharges were recorded.  Interpretation: This EEG is abnormal with moderate size nonspecific continuous slowing of cerebral activity recorded. This pattern of activity can be seen with metabolic and toxic encephalopathies as well as degenerative CNS disorders. No evidence of seizure activity was recorded.   Venetia Maxon M.D. [facility] [phone]

## 2016-01-28 NOTE — Consult Note (Signed)
Consultation Note Date: 01/28/2016   Patient Name: Audrey Obrien  DOB: 1931-01-08  MRN: [id number]  Age / Sex: 82 y.o., female  PCP: Dorian Heckle, MD Referring Physician: Caren Griffins, MD  Reason for Consultation: Establishing goals of care and Psychosocial/spiritual support    Clinical Assessment/Narrative:  82 y.o. female with advanced dementia/ Parkinsonian/Lewy Body, scleroderma (husband believes from [employer]) / bronchiectasis and mycobacterial disease, s/p one year treatment for [facility]. Patient  had been in Hospice/[facility] a few years back and family met with them last week but patient apparently didn't meet criteria. Has had placement of a prosthetic right eye a few years back.  Continued physical, functional and cognitive decline.  Patient rarely ambulates, she has to be fed by her husband, she speaks very little. Brought to ED today after husband found her to be difficult to arouse this am. She also seemed to be in a stiff position. Over the last five days she has become incontinent of urine and also stool.   This NP Wadie Lessen reviewed medical records, received report from team, assessed the patient and then meet at the patient's bedside along with her husvband and step daughter  to discuss diagnosis prognosis, GOC, EOL wishes disposition and options.  The natural trajectory and expectations of dementia detailed.   A detailed discussion was had today regarding advanced directives.  Concepts specific to code status, artifical feeding and hydration, continued IV antibiotics and rehospitalization was had.  The difference between a aggressive medical intervention path  and a palliative comfort care path for this patient at this time was had.  Values and goals of care important to patient and family were attempted to be elicited.  Concept of Hospice and Palliative Care were discussed  Natural  trajectory and expectations at EOL were discussed.  Questions and concerns addressed.  Hard Choices booklet left for review. Family encouraged to call with questions or concerns.  PMT will continue to support holistically.    Primary Decision Maker: Husband  HCPOA: none documented    SUMMARY OF RECOMMENDATIONS  -during this hospital stay treat the treatable, once discharged shift to comfort path, MOST form to be completed tomorrow -trigger home hospice referral  (family encouraged to investigate Veteran benefits for long term placement)  It is getting more difficult for Mr Stroder to care for his wife at home "but I cannot afford a SNF  Code Status/Advance Care Planning: DNR    Code Status Orders        Start     Ordered   01/26/16 2058  Do not attempt resuscitation (DNR)   Continuous    Question Answer Comment  In the event of cardiac or respiratory ARREST Do not call a "code blue"   In the event of cardiac or respiratory ARREST Do not perform Intubation, CPR, defibrillation or ACLS   In the event of cardiac or respiratory ARREST Use medication by any route, position, wound care, and other measures to relive pain and suffering. May use oxygen, suction and manual treatment of airway obstruction as needed for comfort.      01/26/16 2057    Code Status History    Date Active Date Inactive Code Status Order ID Comments User Context   04/04/2012  4:46 PM 04/19/2012  2:04 PM Full Code [id number]  Mliss Sax, RN Inpatient   03/28/2012  2:43 PM 04/04/2012  4:46 PM Full Code [id number]  Vilinda Blanks, RN Inpatient    Advance Directive Documentation  Most Recent Value   Type of Advance Directive  Out of facility DNR (pink MOST or yellow form)   Pre-existing out of facility DNR order (yellow form or pink MOST form)  Physician notified to receive inpatient order, Yellow form placed in chart (order not valid for inpatient use)   "MOST" Form in Place?        Other  Directives:None    Palliative Prophylaxis:    Aspiration, Bowel Regimen, Delirium Protocol, Eye Care, Frequent Pain Assessment, Oral Care and Turn Reposition   Psycho-social/Spiritual:  Support System: [place] Desire for further Chaplaincy support:no Additional Recommendations: Education on Hospice  Prognosis:  Likely less than six months, pending decision for life prolonging measures.  Husband verbalizes full comfort path ponce patient is discahrgeed  Discharge Planning:  Likely home with hospice   Chief Complaint/ Primary Diagnoses: Present on Admission:  . Hyponatremia . Scleroderma ([place]) . Dehydration . HTN (hypertension)  I have reviewed the medical record, interviewed the patient and family, and examined the patient. The following aspects are pertinent.  Past Medical History  Diagnosis Date  . Hypertension   . Acne rosacea   . Chronic rhinitis     no benefit from antihistamines  . Gallbladder & bile duct stone 2007    1.6 cm GB stone; no symptoms  . Pneumonia 2007  . 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 C. difficile colitis 11/07  . Spastic dysphonia     [facility] injs at [facility]  . Hypertension   . Scleroderma (HCC)     probable, ANA+ 1:160, anticentromers strongly pos, USRNP-, TH/TO-,RF equivocal, CCP-, Skin puffiness of hands, telangectasias of hands, raynauds, ECHO increased pulmonary pressures 8/11, pfts stable  . S/P right heart catheterization 10/11  . Benign essential tremor   . Osteoporosis     started alendronate 6/12  . Glaucoma   . Spastic dysphonia     Get B[facility] at [facility]  . Benign essential tremor   . Dementia   . Blindness     per husband, both eyes  . Raynaud disease    Social History   Social History  . Marital Status: Married    Spouse Name: N/A  . Number of Children: 2  . Years of Education: [employer]   Occupational History  . retired Orthoptist   Social History Main Topics  . Smoking status: Never Smoker     . Smokeless tobacco: Never Used  . Alcohol Use: No  . Drug Use: No  . Sexual Activity: Not Asked   Other Topics Concern  . None   Social History Narrative   2 cups a day   Family History  Problem Relation Age of Onset  . Asthma Son    Scheduled Meds: . antiseptic oral rinse  7 mL Mouth Rinse BID  . aspirin EC  81 mg Oral QHS  . cephALEXin  250 mg Oral Q12H  . enoxaparin (LOVENOX) injection  40 mg Subcutaneous Q24H  . losartan  100 mg Oral Daily  . pneumococcal 23 valent vaccine  0.5 mL Intramuscular Tomorrow-1000  . primidone  125 mg Oral Daily  . sodium chloride flush  3 mL Intravenous Q12H   Continuous Infusions:  PRN Meds:.acetaminophen **OR** acetaminophen, bisacodyl, HYDROcodone-homatropine, ondansetron **OR** ondansetron (ZOFRAN) IV, sodium phosphate Medications Prior to Admission:  Prior to Admission medications   Medication Sig Start Date End Date Taking? Authorizing Provider  aspirin 81 MG tablet Take 81 mg  by mouth at bedtime.    Yes Historical Provider, MD  ciprofloxacin (CIPRO) 500 MG tablet Take 500 mg by mouth 2 (two) times daily. 01/23/16  Yes Historical Provider, MD  estradiol (ESTRACE VAGINAL) 0.1 MG/GM vaginal cream Place 1 application vaginally 2 (two) times a week. 01/05/16  Yes Historical Provider, MD  HYDROcodone-homatropine (HYCODAN) 5-1.5 MG/5ML syrup Take 5 mLs by mouth every 6 (six) hours as needed for cough. 01/02/16  Yes Collene Gobble, MD  loratadine (CLARITIN) 10 MG tablet Take 1 tablet (10 mg total) by mouth daily. 08/21/15  Yes Collene Gobble, MD  losartan (COZAAR) 100 MG tablet Take 100 mg by mouth daily.  07/23/15  Yes Historical Provider, MD  neomycin-polymyxin b-dexamethasone (MAXITROL) 3.5-10000-0.1 OINT Place 1 application into the left eye at bedtime. 01/09/16  Yes Historical Provider, MD  primidone (MYSOLINE) 250 MG tablet Take 125 mg by mouth daily. Takes 1/2 tab daily   Yes Historical Provider, MD  trimethoprim (TRIMPEX) 100 MG tablet Take  100 mg by mouth daily. 12/03/15  Yes Historical Provider, MD   No Known Allergies  Review of Systems  Unable to perform ROS   Physical Exam  Constitutional: She appears lethargic. She appears ill.  -frail  Eyes:  -artifical right eye  Cardiovascular: Normal rate, regular rhythm and normal heart sounds.   Respiratory: She has decreased breath sounds in the right lower field and the left lower field.  Musculoskeletal:       Right shoulder: She exhibits decreased strength.  Neurological: She appears lethargic.  Skin: Skin is warm and dry.    Vital Signs: BP 149/56 mmHg  Pulse 102  Temp(Src) 98.2 F (36.8 C) (Oral)  Resp 18  Ht _0  (1.626 m)  Wt 48.988 kg (108 lb)  BMI 18.53 kg/m2  SpO2 98%  SpO2: SpO2: 98 % O2 Device:SpO2: 98 % O2 Flow Rate: .O2 Flow Rate (L/min): 2 L/min  IO: Intake/output summary:  Intake/Output Summary (Last 24 hours) at 01/28/16 1240 Last data filed at 01/28/16 0034  Gross per 24 hour  Intake    840 ml  Output      0 ml  Net    840 ml    LBM: Last BM Date: 01/27/16 Baseline Weight: Weight: 48.988 kg (108 lb) Most recent weight: Weight: 48.988 kg (108 lb)      Palliative Assessment/Data:  Flowsheet Rows        Most Recent Value   Intake Tab    Referral Department  Hospitalist   Unit at Time of Referral  Med/Surg Unit   Palliative Care Primary Diagnosis  Neurology   Date Notified  01/26/16   Palliative Care Type  New Palliative care   Reason for referral  Clarify Goals of Care   Date of Admission  01/26/16   # of days IP prior to Palliative referral  0   Clinical Assessment    Psychosocial & Spiritual Assessment    Palliative Care Outcomes       Additional Data Reviewed:  CBC:    Component Value Date/Time   WBC 8.3 01/27/2016 0720   HGB 12.0 01/27/2016 0720   HCT 35.1* 01/27/2016 0720   PLT 235 01/27/2016 0720   MCV 95.4 01/27/2016 0720   NEUTROABS 3.8 01/26/2016 1228   LYMPHSABS 0.6* 01/26/2016 1228   MONOABS 0.7  01/26/2016 1228   EOSABS 0.1 01/26/2016 1228   BASOSABS 0.0 01/26/2016 1228   Comprehensive Metabolic Panel:    Component Value Date/Time  NA 130* 01/28/2016 0707   K 3.1* 01/28/2016 0707   CL 96* 01/28/2016 0707   CO2 19* 01/28/2016 0707   BUN 6 01/28/2016 0707   CREATININE 0.76 01/28/2016 0707   GLUCOSE 79 01/28/2016 0707   CALCIUM 8.6* 01/28/2016 0707   AST 23 01/26/2016 1228   ALT 11* 01/26/2016 1228   ALKPHOS 55 01/26/2016 1228   BILITOT 0.7 01/26/2016 1228   PROT 7.0 01/26/2016 1228   ALBUMIN 4.0 01/26/2016 1228   Discussed with Dr Cruzita Lederer  Time In: 1245 Time Out: 1400 Time Total: 75 min Greater than 50%  of this time was spent counseling and coordinating care related to the above assessment and plan.  Signed by: Wadie Lessen, NP  Knox Royalty, NP  01/28/2016, 12:40 PM  Please contact Palliative Medicine Team phone at [phone] for questions and concerns.

## 2016-01-28 NOTE — Progress Notes (Addendum)
[facility]Lia[facility] Note:  Patient was evaluated by [facility] on 01/23/16 for hospice services and was found to not be eligible at that time.  She was placed on Palliative Care Services with [facility] at family request on the same day. She was also on hospice with [facility] in 2014, however, was discharged due to a greater than 6 month prognosis.  Notified of family request for [facility] services at home after discharge, given her recent decline and change in functional status. Chart and patient information currently under review to confirm hospice eligibility.   Spoke with husband, at bedside to discuss education related to hospice philosophy, services and team approach to care. Family verbalized understanding of the information provided. They reported she has had a decline in her overall functional status and are hopeful she will now qualify.  [facility] will continue to follow to evaluate eligibility per family request.  Plan to meet with family again tomorrow.  [facility] information and contact numbers have been given to [place] during visit.  Above information shared with Marylene Land, [facility].   Please call with any questions.  Thank You,  Hessie Knows RN, BSN  [facility] Liaison  [phone]

## 2016-01-28 NOTE — Care Management Note (Signed)
Case Management Note  Patient Details  Name: Audrey Obrien MRN: [id number] Date of Birth: October 14, 1931  Subjective/Objective:                Admitted with acute encephalopathy, hx of HTN, scleroderma, CAD, dementia, right eye blindness. From home with husband.   Action/Plan: Per PT's evaluation: SNF. CM to f/u with disposition needs.  Expected Discharge Date:  01/29/16               Expected Discharge Plan:  Home w Home Health Services  In-House Referral:  Clinical Social Work  Discharge planning Services  CM Consult  Post Acute Care Choice:    Choice offered to:     DME Arranged:    DME Agency:     HH Arranged:    HH Agency:     Status of Service:  In process, will continue to follow  Medicare Important Message Given:    Date Medicare IM Given:    Medicare IM give by:    Date Additional Medicare IM Given:    Additional Medicare Important Message give by:     If discussed at Long Length of Stay Meetings, dates discussed:    Additional Comments:  Epifanio Lesches, [place] [phone] 01/28/2016, 12:17 PM

## 2016-01-28 NOTE — Evaluation (Signed)
Clinical/Bedside Swallow Evaluation Patient Details  Name: Audrey Obrien MRN: [id number] Date of Birth: [birth year]  Today's Date: 01/28/2016 Time: SLP Start Time (ACUTE ONLY): 1013 SLP Stop Time (ACUTE ONLY): 1030 SLP Time Calculation (min) (ACUTE ONLY): 17 min  Past Medical History:  Past Medical History  Diagnosis Date  . Hypertension   . Acne rosacea   . Chronic rhinitis     no benefit from antihistamines  . Gallbladder & bile duct stone 2007    1.6 cm GB stone; no symptoms  . Pneumonia 2007  . 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 C. difficile colitis 11/07  . Spastic dysphonia     botox injs at [facility]  . Hypertension   . Scleroderma (HCC)     probable, ANA+ 1:160, anticentromers strongly pos, USRNP-, TH/TO-,RF equivocal, CCP-, Skin puffiness of hands, telangectasias of hands, raynauds, ECHO increased pulmonary pressures 8/11, pfts stable  . S/P right heart catheterization 10/11  . Benign essential tremor   . Osteoporosis     started alendronate 6/12  . Glaucoma   . Spastic dysphonia     Get Botox at [facility]  . Benign essential tremor   . Dementia   . Blindness     per husband, both eyes  . Raynaud disease    Past Surgical History:  Past Surgical History  Procedure Laterality Date  . Lung surgery  1982  . Corneal transplant     HPI:  82 y.o. female with a Past Medical History ofHTN, scleroderma, CAD, dementia, right eye blindness who presents withacute encephalopathy likely multifactorial due to progressive underlying dementia, UTI, and possible seizure. Per spouse report, poor appetite, hacking cough, speaks very little. History of gradually declining cognition. Husband endorses a history of cognitive fluctuations, formed visual hallucinations, gradually worsening gait with shuffling, progressive stiffness and a tendency to fall backwards. Per MD, the constellation of described symptoms as well as the resting tremor and cogwheel rigidity seen  on exam are most consistent with a Parkinsonian syndrome, most likely Lewy body dementia (LBD). History of esophagram 9/11 with mild esophageal dysphagia, mild GERD, minimal reflux esophagitis and severe degenerative disc disease C4-5, C6-7. MBS 03/30/12 with moderate oral and mild pharyngeal dysphagia, no aspiration, recommended dysphagia 2, thin liquids.    Assessment / Plan / Recommendation Clinical Impression  Evaluation limited as patient refusing majority of pos provided. Oral and pharyngeal function with pureed solid trials appearing intact with timely oral transit, initiation of swallow, and no overt s/s of aspiration. Delayed cough noted post intake of thin liquid. Although trials limited, suspect delayed sensation of aspirates due to delayed swallow response. Recommend pureed solids and nectar thick liquids as a precautions. RN made aware. Will f/u at bedside for continued diagnostic treatment including potential to advance diet, need for instrumental exam, and diet tolerance.    Aspiration Risk  Moderate aspiration risk    Diet Recommendation Dysphagia 1 (Puree);Nectar-thick liquid   Liquid Administration via: Cup;Straw Medication Administration: Whole meds with puree Supervision: Staff to assist with self feeding;Full supervision/cueing for compensatory strategies Compensations: Slow rate;Small sips/bites Postural Changes: Seated upright at 90 degrees    Other  Recommendations Oral Care Recommendations: Oral care BID Other Recommendations: Order thickener from pharmacy;Prohibited food (jello, ice cream, thin soups);Remove water pitcher   Follow up Recommendations   (TBD)    Frequency and Duration min 2x/week  1 week       Prognosis Prognosis for Safe  Diet Advancement: Fair Barriers to Reach Goals: Cognitive deficits      Swallow Study   General HPI: 82 y.o. female with a Past Medical History ofHTN, scleroderma, CAD, dementia, right eye blindness who presents withacute  encephalopathy likely multifactorial due to progressive underlying dementia, UTI, and possible seizure. Per spouse report, poor appetite, hacking cough, speaks very little. History of gradually declining cognition. Husband endorses a history of cognitive fluctuations, formed visual hallucinations, gradually worsening gait with shuffling, progressive stiffness and a tendency to fall backwards. Per MD, the constellation of described symptoms as well as the resting tremor and cogwheel rigidity seen on exam are most consistent with a Parkinsonian syndrome, most likely Lewy body dementia (LBD). History of esophagram 9/11 with mild esophageal dysphagia, mild GERD, minimal reflux esophagitis and severe degenerative disc disease C4-5, C6-7. MBS 03/30/12 with moderate oral and mild pharyngeal dysphagia, no aspiration, recommended dysphagia 2, thin liquids.  Type of Study: Bedside Swallow Evaluation Previous Swallow Assessment: see HPI Diet Prior to this Study: NPO Temperature Spikes Noted: No Respiratory Status: Room air History of Recent Intubation: No Behavior/Cognition: Lethargic/Drowsy;Confused;Requires cueing Oral Cavity Assessment: Within Functional Limits Oral Care Completed by SLP: No Oral Cavity - Dentition: Adequate natural dentition Vision: Impaired for self-feeding Self-Feeding Abilities: Total assist Patient Positioning: Upright in bed Baseline Vocal Quality: Normal Volitional Cough: Cognitively unable to elicit Volitional Swallow: Unable to elicit    Oral/Motor/Sensory Function Overall Oral Motor/Sensory Function: Within functional limits   Ice Chips Ice chips: Impaired (patient expelled forcefully from oral cavity)   Thin Liquid Thin Liquid: Impaired Presentation: Straw (could not facilitate use of cup due to cognitive status) Pharyngeal  Phase Impairments: Cough - Delayed    Nectar Thick Nectar Thick Liquid: Not tested (patient refused)   Honey Thick Honey Thick Liquid: Not tested    Puree Puree: Within functional limits Presentation: Spoon   Solid   GO  Audrey Keiffer MA, CCC-SLP [phone]  Solid: Not tested        Audrey Obrien [name] 01/28/2016,10:38 AM

## 2016-01-29 DIAGNOSIS — R8290 Unspecified abnormal findings in urine: Secondary | ICD-10-CM

## 2016-01-29 DIAGNOSIS — E871 Hypo-osmolality and hyponatremia: Secondary | ICD-10-CM

## 2016-01-29 DIAGNOSIS — F0390 Unspecified dementia without behavioral disturbance: Secondary | ICD-10-CM

## 2016-01-29 DIAGNOSIS — Z7189 Other specified counseling: Secondary | ICD-10-CM

## 2016-01-29 LAB — URINE CULTURE: Culture: >=100000

## 2016-01-29 LAB — POTASSIUM: POTASSIUM: 3.2 mmol/L — AB (ref 3.5–5.1)

## 2016-01-29 MED ORDER — LORAZEPAM 1 MG PO TABS: 1.0000 mg | ORAL_TABLET | ORAL | Status: DC | PRN

## 2016-01-29 MED ORDER — MORPHINE SULFATE (CONCENTRATE) 10 MG/0.5ML PO SOLN: 5.0000 mg | ORAL | Status: DC | PRN

## 2016-01-29 MED ORDER — LORAZEPAM 2 MG/ML IJ SOLN
0.5000 mg | Freq: Once | INTRAMUSCULAR | Status: AC
  Administered 2016-01-29: 0.5 mg via INTRAVENOUS
  Filled 2016-01-29: qty 1

## 2016-01-29 NOTE — Progress Notes (Addendum)
Daily Progress Note   Patient Name: Audrey Obrien       Date: 01/29/2016 DOB: [birth year]  Age: 82 y.o. MRN#: [id number] Attending Physician: Leatha Gilding, MD Primary Care Physician: Kristie Cowman, MD Admit Date: 01/26/2016  Reason for Consultation/Follow-up: Establishing goals of care, Hospice Evaluation and Psychosocial/spiritual support  Subjective:  -continued discussion regarding GOC, EOL wishes, disposition and options.  Focus of care is full comfort.  MOST form completed  -continued cognitive decline, more difficult to arouse, taking only sips and bites    Length of Stay: 3 days  Current Medications: Scheduled Meds:  . antiseptic oral rinse  7 mL Mouth Rinse BID  . aspirin EC  81 mg Oral QHS  . cephALEXin  250 mg Oral Q12H  . enoxaparin (LOVENOX) injection  40 mg Subcutaneous Q24H  . losartan  100 mg Oral Daily  . pneumococcal 23 valent vaccine  0.5 mL Intramuscular Tomorrow-1000  . primidone  125 mg Oral Daily  . sodium chloride flush  3 mL Intravenous Q12H    Continuous Infusions:    PRN Meds: acetaminophen **OR** acetaminophen, bisacodyl, HYDROcodone-homatropine, ondansetron **OR** ondansetron (ZOFRAN) IV, sodium phosphate  Physical Exam: Physical Exam  Constitutional:  Frail, minimally responsive  Cardiovascular: Normal rate and regular rhythm.   Pulmonary/Chest: She has decreased breath sounds in the right lower field and the left lower field. She has no wheezes.  Skin: Skin is warm and dry.                Vital Signs: BP 183/75 mmHg  Pulse 81  Temp(Src) 97.8 F (36.6 C) (Oral)  Resp 20  Ht 5\' 4"  (1.626 m)  Wt 48.988 kg (108 lb)  BMI 18.53 kg/m2  SpO2 100% SpO2: SpO2: 100 % O2 Device: O2 Device: Not Delivered O2 Flow Rate: O2 Flow Rate  (L/min): 2 L/min  Intake/output summary:  Intake/Output Summary (Last 24 hours) at 01/29/16 1440 Last data filed at 01/29/16 1026  Gross per 24 hour  Intake    120 ml  Output      0 ml  Net    120 ml   LBM: Last BM Date: 01/27/16 Baseline Weight: Weight: 48.988 kg (108 lb) Most recent weight: Weight: 48.988 kg (108 lb)       Palliative Assessment/Data: Flowsheet Rows  Most Recent Value   Intake Tab    Referral Department  Hospitalist   Unit at Time of Referral  Med/Surg Unit   Palliative Care Primary Diagnosis  Neurology   Date Notified  01/26/16   Palliative Care Type  New Palliative care   Reason for referral  Clarify Goals of Care   Date of Admission  01/26/16   Date first seen by Palliative Care  01/28/16   # of days Palliative referral response time  2 Day(s)   # of days IP prior to Palliative referral  0   Clinical Assessment    Psychosocial & Spiritual Assessment    Palliative Care Outcomes       Additional Data Reviewed: CBC    Component Value Date/Time   WBC 8.3 01/27/2016 0720   RBC 3.68* 01/27/2016 0720   HGB 12.0 01/27/2016 0720   HCT 35.1* 01/27/2016 0720   PLT 235 01/27/2016 0720   MCV 95.4 01/27/2016 0720   MCH 32.6 01/27/2016 0720   MCHC 34.2 01/27/2016 0720   RDW 12.9 01/27/2016 0720   LYMPHSABS 0.6* 01/26/2016 1228   MONOABS 0.7 01/26/2016 1228   EOSABS 0.1 01/26/2016 1228   BASOSABS 0.0 01/26/2016 1228    CMP     Component Value Date/Time   NA 128* 01/28/2016 1409   K 3.2* 01/29/2016 0552   CL 93* 01/28/2016 1409   CO2 23 01/28/2016 1409   GLUCOSE 152* 01/28/2016 1409   BUN 7 01/28/2016 1409   CREATININE 0.73 01/28/2016 1409   CALCIUM 8.8* 01/28/2016 1409   PROT 7.0 01/26/2016 1228   ALBUMIN 4.0 01/26/2016 1228   AST 23 01/26/2016 1228   ALT 11* 01/26/2016 1228   ALKPHOS 55 01/26/2016 1228   BILITOT 0.7 01/26/2016 1228   GFRNONAA >60 01/28/2016 1409   GFRAA >60 01/28/2016 1409       Problem List:  Patient Active  Problem List   Diagnosis Date Noted  . Palliative care encounter   . DNR (do not resuscitate)   . Pressure ulcer 01/27/2016  . Abnormal urinalysis 01/26/2016  . Bronchiectasis (HCC) 01/26/2016  . Unresponsiveness 01/26/2016  . Dementia 01/26/2016  . UTI (lower urinary tract infection)   . MAIC (mycobacterium avium-intracellulare complex) (HCC) 04/16/2012  . Physical deconditioning 04/05/2012  . Dilantin toxicity 03/31/2012  . Cystitis 03/28/2012  . Hyponatremia 03/28/2012  . Dehydration 03/28/2012  . HTN (hypertension) 03/28/2012  . Seizure disorder (HCC) 03/28/2012  . Scleroderma (HCC) 03/28/2012  . Dysphagia 03/28/2012  . Weakness generalized 03/28/2012  . Peripheral edema 03/28/2012  . Chronic cough 09/15/2011  . Bronchiectasis without acute exacerbation (HCC) 09/15/2011  . Chronic allergic rhinitis 09/15/2011  . GERD (gastroesophageal reflux disease) 09/15/2011     Palliative Care Assessment & Plan    1.Code Status:  DNR    Code Status Orders        Start     Ordered   01/26/16 2058  Do not attempt resuscitation (DNR)   Continuous    Question Answer Comment  In the event of cardiac or respiratory ARREST Do not call a "code blue"   In the event of cardiac or respiratory ARREST Do not perform Intubation, CPR, defibrillation or ACLS   In the event of cardiac or respiratory ARREST Use medication by any route, position, wound care, and other measures to relive pain and suffering. May use oxygen, suction and manual treatment of airway obstruction as needed for comfort.      01/26/16 2057  Code Status History    Date Active Date Inactive Code Status Order ID Comments User Context   04/04/2012  4:46 PM 04/19/2012  2:04 PM Full Code [id number]  Cleotilde Neer, RN Inpatient   03/28/2012  2:43 PM 04/04/2012  4:46 PM Full Code [id number]  Madalyn Rob, RN Inpatient    Advance Directive Documentation        Most Recent Value   Type of Advance Directive  Out of  facility DNR (pink MOST or yellow form)   Pre-existing out of facility DNR order (yellow form or pink MOST form)  Physician notified to receive inpatient order, Yellow form placed in chart (order not valid for inpatient use)   "MOST" Form in Place?         2. Goals of Care/Additional Recommendations:   Limitations on Scope of Treatment: Full Comfort Care  Desire for further Chaplaincy support:no  Psycho-social Needs: Education on Hospice  3. Symptom Management:      1. Pain/Dyspnea: Roxanol 5 mg po/sl every 2 hrs prn       2.  Agitation: Ativan 1 mg po/sl every 6 hrs prn  4. Palliative Prophylaxis:   Aspiration, Bowel Regimen, Delirium Protocol, Eye Care, Frequent Pain Assessment and Oral Care  5. Prognosis: weeks  6. Discharge Planning:  Hospice facility, per Dr Charlean Sanfilippo recommendation for evaluate for hospice facility, order placed   Care plan was discussed with Dr Elvera Lennox  Thank you for allowing the Palliative Medicine Team to assist in the care of this patient.   Time In: 1430 Time Out: 1455 Total Time 25 min Prolonged Time Billed  no         Canary Brim, NP  01/29/2016, 2:40 PM  Please contact Palliative Medicine Team phone at [phone] for questions and concerns.

## 2016-01-29 NOTE — Care Management Important Message (Signed)
Important Message  Patient Details  Name: Audrey Obrien MRN: [id number] Date of Birth: Oct 31, 1931   Medicare Important Message Given:  Yes    Kyla Balzarine 01/29/2016, 1:55 PM

## 2016-01-29 NOTE — Progress Notes (Signed)
Speech Language Pathology Treatment: Dysphagia  Patient Details Name: Audrey Obrien MRN: [id number] DOB: 1931-10-13 Today's Date: 01/29/2016 Time: 4098-1191 SLP Time Calculation (min) (ACUTE ONLY): 13 min  Assessment / Plan / Recommendation Clinical Impression  Treatment focused on diet tolerance and potential to advance diet. Patient lethargic but easily aroused. SLP provided po trials for skilled observation of function. SLP provided total verbal, visual, and tactile cueing for intact awareness of bolus. Once bolus placed in oral cavity, oral phase delayed with eventual swallow initiation and overt s/s of aspiration with thin liquids. Trials of pureed solids and nectar thick liquids elicited improving appearing airway protection although cannot r/o silent aspiration given severity of cognitive impairment. Reviewed palliative care notes and noted plans for full comfort following d/c. Although current diet is safest and most efficient for patient at this time, family may wish to liberalize for patient pleasure and/or comfort. Will f/u for education as needed.     HPI HPI: 82 y.o. female with a Past Medical History ofHTN, scleroderma, CAD, dementia, right eye blindness who presents withacute encephalopathy likely multifactorial due to progressive underlying dementia, UTI, and possible seizure. Per spouse report, poor appetite, hacking cough, speaks very little. History of gradually declining cognition. Husband endorses a history of cognitive fluctuations, formed visual hallucinations, gradually worsening gait with shuffling, progressive stiffness and a tendency to fall backwards. Per MD, the constellation of described symptoms as well as the resting tremor and cogwheel rigidity seen on exam are most consistent with a Parkinsonian syndrome, most likely Lewy body dementia (LBD). History of esophagram 9/11 with mild esophageal dysphagia, mild GERD, minimal reflux esophagitis and severe degenerative disc  disease C4-5, C6-7. MBS 03/30/12 with moderate oral and mild pharyngeal dysphagia, no aspiration, recommended dysphagia 2, thin liquids.       SLP Plan  Continue with current plan of care     Recommendations  Diet recommendations: Dysphagia 1 (puree);Nectar-thick liquid Liquids provided via: Cup;Straw;Teaspoon Medication Administration: Whole meds with puree Supervision: Full supervision/cueing for compensatory strategies Compensations: Slow rate;Small sips/bites Postural Changes and/or Swallow Maneuvers: Seated upright 90 degrees             Oral Care Recommendations: Oral care BID Follow up Recommendations:  (TBD) Plan: Continue with current plan of care     GO             [facility] MA, CCC-SLP [phone]    Audrey Obrien [name] 01/29/2016, 11:36 AM

## 2016-01-29 NOTE — Progress Notes (Signed)
PROGRESS NOTE  Audrey Obrien:[id number] DOB: 08/06/1931 DOA: 01/26/2016 PCP: Kristie Cowman, MD Outpatient Specialists:    LOS: 3 days   Brief Narrative: 82 year old female with hypertension, scleroderma, coronary artery disease, dementia, with came in with acute encephalopathy and was admitted on 2/20  Assessment & Plan: Active Problems:   Hyponatremia   Dehydration   HTN (hypertension)   Seizure disorder (HCC)   Scleroderma (HCC)   Abnormal urinalysis   Bronchiectasis (HCC)   Unresponsiveness   Dementia   Pressure ulcer   Palliative care encounter   DNR (do not resuscitate)   Acute encephalopathy - poor baseline to begin with, she has poor functional status - likely related to urinary tract infection as well as underlying dementia  - She is also very hard of hearing as well as blind making communication is difficult - neurology consulted. Discussed with neurology today, patient will likely need residential hospice  Goals of care - discussed with husband bedside, endorses a 4 year long decline. She is unresponsive today, he is very concerned about home environment. I think she will benefit from residential hospice as she has no po intake and appears to be declining rapidly. Discussed with palliative. SW consulted.   Urinary tract infection  - Patient initially started on Rocephin, urine culture speciated to Escherichia coli which is sensitive to cefazolin - continue Keflex  Hyponatremia - stable  HTN (hypertension) - Pt on losartan, will continue  Seizure disorder (HCC) - EEG non specific, no evidence of seizures  Scleroderma (HCC) - Will continue pain medication for prn discomfort.  Pressure ulcer - woc consult  Questionable infiltrate on the chest x-ray - This is chronic, seen for a number of years on her CT scan of the chest -  No respiratory symptoms, unlikely to be an active infection  DVT prophylaxis: Lovenox Code Status: DNR Family  Communication: d/w husband bedside Disposition Plan: residential hospice Barriers for discharge: MRI, EEG  Consultants:   Neurology   Procedures:   EEG: no seizure activity   Antimicrobials:  Ceftriaxone 2/20 >> 2/22  Keflex 2/22 >>  Subjective: - unresponsive this morning  Objective: Filed Vitals:   01/29/16 0516 01/29/16 0517 01/29/16 0801 01/29/16 1454  BP: 169/78  183/75 132/69  Pulse: 98 97 81 108  Temp:  97.8 F (36.6 C)    TempSrc:  Oral    Resp: Height:      Weight:      SpO2: 100% 100% 100% 99%    Intake/Output Summary (Last 24 hours) at 01/29/16 1535 Last data filed at 01/29/16 1026  Gross per 24 hour  Intake    120 ml  Output      0 ml  Net    120 ml   Filed Weights   01/27/16 1253  Weight: 48.988 kg (108 lb)    Examination: BP 132/69 mmHg  Pulse 108  Temp(Src) 97.8 F (36.6 C) (Oral)  Resp 18  Ht  (1.626 m)  Wt 48.988 kg (108 lb)  BMI 18.53 kg/m2  SpO2 99% General exam: NAD Respiratory system: Clear. No increased work of breathing. No wheezing, no crackles Cardiovascular system: regular rate and rhythm, no murmurs, gallops. No JVD. No peripheral edema.  Gastrointestinal system: Abdomen is nondistended, soft and nontender. Normal bowel sounds heard. Central nervous system: AxOx1. Non focal  Extremities: No clubbing/cyanosis Skin: no rashes   Data Reviewed: I have personally reviewed following labs and imaging studies  CBC:  Recent Labs Lab 01/26/16 1228 01/27/16 0720  WBC 5.2 8.3  NEUTROABS 3.8  --   HGB 12.0 12.0  HCT 34.2* 35.1*  MCV 96.3 95.4  PLT 215 235   Basic Metabolic Panel:  Recent Labs Lab 01/27/16 0720 01/27/16 1457 01/27/16 2100 01/28/16 0707 01/28/16 1409 01/29/16 0552  NA 128* 129* 129* 130* 128*  --   K 3.7 3.5 3.4* 3.1* 3.0* 3.2*  CL 96* 97* 97* 96* 93*  --   CO2 20* 19* 18* 19* 23  --   GLUCOSE 99 111* 97 79 152*  --   BUN 10 10 8 6 7   --   CREATININE 0.72 0.73 0.73 0.76 0.73   --   CALCIUM 8.2* 8.7* 8.4* 8.6* 8.8*  --   MG  --  1.8  --   --   --   --   PHOS  --  3.1  --   --   --   --    GFR: Estimated Creatinine Clearance: 41.2 mL/min (by C-G formula based on Cr of 0.73). Liver Function Tests:  Recent Labs Lab 01/26/16 1228  AST 23  ALT 11*  ALKPHOS 55  BILITOT 0.7  PROT 7.0  ALBUMIN 4.0   No results for input(s): LIPASE, AMYLASE in the last 168 hours.  Recent Labs Lab 01/27/16 1457  AMMONIA 55*   Coagulation Profile: No results for input(s): INR, PROTIME in the last 168 hours. Cardiac Enzymes:  Recent Labs Lab 01/26/16 2148  CKTOTAL 86   BNP (last 3 results) No results for input(s): PROBNP in the last 8760 hours. HbA1C: No results for input(s): HGBA1C in the last 72 hours. CBG: No results for input(s): GLUCAP in the last 168 hours. Lipid Profile: No results for input(s): CHOL, HDL, LDLCALC, TRIG, CHOLHDL, LDLDIRECT in the last 72 hours. Thyroid Function Tests:  Recent Labs  01/27/16 1457  TSH 1.347   Anemia Panel: No results for input(s): VITAMINB12, FOLATE, FERRITIN, TIBC, IRON, RETICCTPCT in the last 72 hours. Urine analysis:    Component Value Date/Time   COLORURINE YELLOW 01/26/2016 1200   APPEARANCEUR CLEAR 01/26/2016 1200   LABSPEC 1.013 01/26/2016 1200   PHURINE 6.0 01/26/2016 1200   GLUCOSEU NEGATIVE 01/26/2016 1200   HGBUR NEGATIVE 01/26/2016 1200   BILIRUBINUR NEGATIVE 01/26/2016 1200   KETONESUR 15* 01/26/2016 1200   PROTEINUR NEGATIVE 01/26/2016 1200   UROBILINOGEN 0.2 04/16/2012 1038   NITRITE POSITIVE* 01/26/2016 1200   LEUKOCYTESUR MODERATE* 01/26/2016 1200   Sepsis Labs: Invalid input(s): PROCALCITONIN, LACTICIDVEN  Recent Results (from the past 240 hour(s))  Urine culture     Status: None   Collection Time: 01/26/16 12:00 PM  Result Value Ref Range Status   Specimen Description URINE, CATHETERIZED  Final   Special Requests NONE  Final   Culture >=100,000 COLONIES/mL ESCHERICHIA COLI  Final    Report Status 01/29/2016 FINAL  Final   Organism ID, Bacteria ESCHERICHIA COLI  Final      Susceptibility   Escherichia coli - MIC*    AMPICILLIN >=32 RESISTANT Resistant     CEFAZOLIN 8 SENSITIVE Sensitive     CEFTRIAXONE <=1 SENSITIVE Sensitive     CIPROFLOXACIN >=4 RESISTANT Resistant     GENTAMICIN <=1 SENSITIVE Sensitive     IMIPENEM <=0.25 SENSITIVE Sensitive     NITROFURANTOIN <=16 SENSITIVE Sensitive     TRIMETH/SULFA >=320 RESISTANT Resistant     AMPICILLIN/SULBACTAM 16 INTERMEDIATE Intermediate     PIP/TAZO <=4 SENSITIVE Sensitive     * >=  100,000 COLONIES/mL ESCHERICHIA COLI  Blood Culture (routine x 2)     Status: None (Preliminary result)   Collection Time: 01/26/16 12:27 PM  Result Value Ref Range Status   Specimen Description BLOOD RIGHT ANTECUBITAL  Final   Special Requests IN PEDIATRIC BOTTLE 4CC  Final   Culture NO GROWTH 3 DAYS  Final   Report Status PENDING  Incomplete  Blood Culture (routine x 2)     Status: None (Preliminary result)   Collection Time: 01/26/16 12:27 PM  Result Value Ref Range Status   Specimen Description BLOOD LEFT HAND  Final   Special Requests IN PEDIATRIC BOTTLE 4CC  Final   Culture NO GROWTH 3 DAYS  Final   Report Status PENDING  Incomplete      Radiology Studies: No results found.   Scheduled Meds: . antiseptic oral rinse  7 mL Mouth Rinse BID  . aspirin EC  81 mg Oral QHS  . cephALEXin  250 mg Oral Q12H  . enoxaparin (LOVENOX) injection  40 mg Subcutaneous Q24H  . losartan  100 mg Oral Daily  . pneumococcal 23 valent vaccine  0.5 mL Intramuscular Tomorrow-1000  . primidone  125 mg Oral Daily  . sodium chloride flush  3 mL Intravenous Q12H   Continuous Infusions:     Pamella Pert, MD, PhD [facility] Pager [phone]  If 7PM-7AM, please contact night-coverage [email or web address] Password [facility] 01/29/2016, 3:35 PM

## 2016-01-29 NOTE — Progress Notes (Signed)
Physical Therapy Treatment Patient Details Name: Audrey Obrien MRN: [id number] DOB: 10/08/1931 Today's Date: 01/29/2016    History of Present Illness y.o. female with a Past Medical History of HTN, scleroderma, CAD, dementia, right eye blindness who presents withacute encephalopathy likely multifactorial due to progressive underlying dementia, UTI, and possible seizure.    PT Comments    Pt performed increased mobility and transferred from bed to chair with stedy.  Pt required max assist to boost hips for placement into stedy.    Follow Up Recommendations  SNF;Supervision/Assistance - 24 hour     Equipment Recommendations  None recommended by PT    Recommendations for Other Services       Precautions / Restrictions Precautions Precautions: Fall Restrictions Weight Bearing Restrictions: No    Mobility  Bed Mobility Overal bed mobility: Needs Assistance Bed Mobility: Supine to Sit     Supine to sit: Max assist     General bed mobility comments: Pt very rigid, assists very minimally. Max VC for technique.  Max assist for trunk and LE support to sit EOB. Pt able to pull LEs back into bed with max VC and faciliatatory cues. Pt required blocking of B knees to maintain sitting edge of bed.  Pt required max VCs for foot placement on foot plate of stedy.    Transfers Overall transfer level: Needs assistance Equipment used: None Transfers: Sit to/from Stand Sit to Stand: Max assist         General transfer comment: Pt able to follow tactile cueing for hand placement on cross bar of stedy.  Pt performed sit to stand with max assist +1, lifting bottom for placement of stedy with max assist.  Pt required cues for technique with increased time to follow commands.    Ambulation/Gait Ambulation/Gait assistance:  (unable per spouse pt does not ambulate only shuffling steps to [facility].  )               Stairs            Wheelchair Mobility    Modified Rankin (Stroke  Patients Only)       Balance     Sitting balance-Leahy Scale: Poor Sitting balance - Comments: supported sitting edge of bed with BUE support.  Pt required cues sitting erect.  Only able to sit unassisted for brief periods before demonstrating LOB.                              Cognition Arousal/Alertness: Lethargic Behavior During Therapy: Flat affect Overall Cognitive Status:  ([name] present reports he lifts patient for transfers at home including lifting patient from trundle bed around floor height.  )                      Exercises      General Comments General comments (skin integrity, edema, etc.): Husband reports performing all mobility at home.  Husband feels at this time he cannot care for patient and spouse is agreeable to rehab.        Pertinent Vitals/Pain Pain Assessment: Faces Pain Score: 0-No pain Faces Pain Scale: No hurt    Home Living                      Prior Function            PT Goals (current goals can now be found in the care plan section)  Acute Rehab PT Goals Patient Stated Goal: none stated Potential to Achieve Goals: Fair Progress towards PT goals: Progressing toward goals    Frequency  Min 2X/week    PT Plan      Co-evaluation             End of Session Equipment Utilized During Treatment: Gait belt Activity Tolerance: Patient limited by lethargy;Treatment limited secondary to agitation Patient left: in chair;with chair alarm set;with call bell/phone within reach;with family/visitor present     Time: 1356-1415 PT Time Calculation (min) (ACUTE ONLY): 19 min  Charges:  $Therapeutic Activity: 8-22 mins                    G Codes:      Florestine Avers February 13, 2016, 2:39 PM  Joycelyn Rua, PTA pager [phone]

## 2016-01-29 NOTE — Care Management Important Message (Signed)
Important Message  Patient Details  Name: Audrey Obrien MRN: [id number] Date of Birth: 1931/06/21   Medicare Important Message Given:  Yes    Kyla Balzarine 01/29/2016, 1:54 PM

## 2016-01-29 NOTE — Progress Notes (Signed)
Nutrition Follow-up  DOCUMENTATION CODES:   Not applicable  INTERVENTION:   -Magic Cup TID with meals  NUTRITION DIAGNOSIS:   Inadequate oral intake related to lethargy/confusion as evidenced by meal completion < 25%.  Ongoing  GOAL:   Patient will meet greater than or equal to 90% of their needs  Unmet  MONITOR:   Diet advancement, Labs, Weight trends, Skin, I & O's  REASON FOR ASSESSMENT:   Low Braden    ASSESSMENT:   Audrey Obrien is a 82 y.o. female with a Past Medical History ofHTN, scleroderma, CAD, dementia, right eye blindness who presents withacute encephalopathy likely multifactorial due to progressive underlying dementia, UTI, and possible seizure. Suspect respiratory status at baseline without direct evidence of pneumonia. Suspect patient with some underlying chronic scarring secondary to pulmonary fibrosis from scleroderma versus other chronic process.  Palliative care team about to evaluate pt at time of visit. Per hospice notes, plan to is for pt to receive hospice services at discharge.   Reviewed CWOCN note from 01/27/16; pt with partial thickness wound on buttocks.   Pt underwent BSE by SLP on 01/28/16, which revealed moderate aspiration risk. Pt has been advanced to a dysphagia 1 diet with nectar thick liquids. Pt is very lethargic and meal completion is poor; PO: 0%.   Labs reviewed.   Diet Order:  DIET - DYS 1 Room service appropriate?: Yes; Fluid consistency:: Nectar Thick  Skin:  Wound (see comment) (partial thickness wound to buttocks)  Last BM:  01/27/16  Height:   Ht Readings from Last 1 Encounters:  01/27/16  (1.626 m)    Weight:   Wt Readings from Last 1 Encounters:  01/27/16 108 lb (48.988 kg)    Ideal Body Weight:  54.5 kg  BMI:  Body mass index is 18.53 kg/(m^2).  Estimated Nutritional Needs:   Kcal:  1250-1450  Protein:  55-65 grams  Fluid:  1.2-1.4 L  EDUCATION NEEDS:   No education needs identified at this  time  Jandi Swiger A. Mayford Knife, RD, LDN, CDE Pager: [phone] After hours Pager: [phone]

## 2016-01-29 NOTE — Progress Notes (Addendum)
[facility] Liaison Note:  Notified by Marylene Land of family request for [facility].  Spoke with husband at bedside, to explain services and confirm interest.  He verbalized that he is concerned that his wife has continued to decline, and feels he cannot take care of her in the home.  He also stated he feels her time is limited and would appreciate the support of residential hospice and increased assistance with her care.  Hospital liaison team will continue to follow.  Osborne Casco, CSW updated. Will update on room availability in AM. Please call with any questions.  Thank you, Hessie Knows RN, [facility] Liaison [phone]

## 2016-01-30 DIAGNOSIS — J189 Pneumonia, unspecified organism: Secondary | ICD-10-CM | POA: Insufficient documentation

## 2016-01-30 DIAGNOSIS — E860 Dehydration: Secondary | ICD-10-CM

## 2016-01-30 LAB — PROCALCITONIN: Procalcitonin: <0.1 ng/mL

## 2016-01-30 MED ORDER — LORAZEPAM 1 MG PO TABS: 1.0000 mg | ORAL_TABLET | ORAL | Status: AC | PRN

## 2016-01-30 MED ORDER — MORPHINE SULFATE (CONCENTRATE) 10 MG/0.5ML PO SOLN: 5.0000 mg | ORAL | Status: AC | PRN

## 2016-01-30 MED ORDER — HYDROCODONE-HOMATROPINE 5-1.5 MG/5ML PO SYRP: 5.0000 mL | ORAL_SOLUTION | Freq: Four times a day (QID) | ORAL | Status: AC | PRN

## 2016-01-30 MED ORDER — RESOURCE THICKENUP CLEAR PO POWD
ORAL | Status: DC | PRN
  Administered 2016-01-30: 11:00:00 via ORAL
  Filled 2016-01-30: qty 125

## 2016-01-30 NOTE — Discharge Summary (Signed)
Physician Discharge Summary  ROLONDA PONTARELLI ZOX:[id number] DOB: 1931/03/19 DOA: 01/26/2016  PCP: Kristie Cowman, MD  Admit date: 01/26/2016 Discharge date: 01/30/2016  Time spent: > 30 minutes  Recommendations for Outpatient Follow-up:  1. Discharge to residential hospice   Discharge Diagnoses:  Active Problems:   Hyponatremia   Dehydration   HTN (hypertension)   Seizure disorder (HCC)   Scleroderma (HCC)   Abnormal urinalysis   Bronchiectasis (HCC)   Unresponsiveness   Dementia   Pressure ulcer   Palliative care encounter   DNR (do not resuscitate)   CAP (community acquired pneumonia)   Discharge Condition: guarded  Diet recommendation: as tolerated  Filed Weights   01/27/16 1253  Weight: 48.988 kg (108 lb)    History of present illness:  See H&P, Labs, Consult and Test reports for all details in brief, patient is a 82 year old female with hypertension, scleroderma, coronary artery disease, dementia, with came in with acute encephalopathy and was admitted on 2/20  Hospital Course:  Acute encephalopathy - poor baseline to begin with, she has poor functional status - likely related to urinary tract infection as well as underlying dementia  - She is also very hard of hearing as well as blind making communication is difficult Goals of care - discussed with husband bedside, endorses a 4 year long decline. I think she will benefit from residential hospice as she has no po intake and appears to be declining rapidly. Transition to full comfort. Urinary tract infection  - Patient initially started on Rocephin, urine culture speciated to Escherichia coli which is sensitive to cefazolin and was narrowed to keflex. Finished a 5 day course.  Hyponatremia - stable HTN (hypertension)  Seizure disorder (HCC) - EEG non specific, no evidence of seizures Scleroderma (HCC) - Will continue pain medication for prn discomfort. Pressure ulcer  Questionable infiltrate on the chest x-ray  - This is chronic, seen for a number of years on her CT scan of the chest.  No respiratory symptoms, unlikely to be an active infection  Procedures:  None    Consultations:  Neurology  Palliative care  Discharge Exam: Filed Vitals:   01/29/16 1454 01/29/16 2058 01/30/16 0541 01/30/16 1043  BP: 132/69 159/73 183/91 170/82  Pulse: 108 101 100 80  Temp:   98 F (36.7 C)   TempSrc:   Oral   Resp: Height:      Weight:      SpO2: 99% 97% 99%    General: NAD Cardiovascular: RRR Respiratory: CTA biL  Discharge Instructions Activity:  As tolerated   Get Medicines reviewed and adjusted: Please take all your medications with you for your next visit with your Primary MD  Please request your Primary MD to go over all hospital tests and procedure/radiological results at the follow up, please ask your Primary MD to get all Hospital records sent to his/her office.  If you experience worsening of your admission symptoms, develop shortness of breath, life threatening emergency, suicidal or homicidal thoughts you must seek medical attention immediately by calling 911 or calling your MD immediately if symptoms less severe.  You must read complete instructions/literature along with all the possible adverse reactions/side effects for all the Medicines you take and that have been prescribed to you. Take any new Medicines after you have completely understood and accpet all the possible adverse reactions/side effects.   Do not drive when taking Pain medications.   Do not take more than prescribed Pain, Sleep  and Anxiety Medications  Special Instructions: If you have smoked or chewed Tobacco in the last 2 yrs please stop smoking, stop any regular Alcohol and or any Recreational drug use.  Wear Seat belts while driving.  Please note  You were cared for by a hospitalist during your hospital stay. Once you are discharged, your primary care physician will handle any further  medical issues. Please note that NO REFILLS for any discharge medications will be authorized once you are discharged, as it is imperative that you return to your primary care physician (or establish a relationship with a primary care physician if you do not have one) for your aftercare needs so that they can reassess your need for medications and monitor your lab values.    Medication List    STOP taking these medications        ciprofloxacin 500 MG tablet  Commonly known as:  CIPRO     ESTRACE VAGINAL 0.1 MG/GM vaginal cream  Generic drug:  estradiol      TAKE these medications        aspirin 81 MG tablet  Take 81 mg by mouth at bedtime.     HYDROcodone-homatropine 5-1.5 MG/5ML syrup  Commonly known as:  HYCODAN  Take 5 mLs by mouth every 6 (six) hours as needed for cough.     HYDROcodone-homatropine 5-1.5 MG/5ML syrup  Commonly known as:  HYCODAN  Take 5 mLs by mouth every 6 (six) hours as needed for cough.     loratadine 10 MG tablet  Commonly known as:  CLARITIN  Take 1 tablet (10 mg total) by mouth daily.     LORazepam 1 MG tablet  Commonly known as:  ATIVAN  Take 1 tablet (1 mg total) by mouth every 4 (four) hours as needed for anxiety.     losartan 100 MG tablet  Commonly known as:  COZAAR  Take 100 mg by mouth daily.     morphine CONCENTRATE 10 MG/0.5ML Soln concentrated solution  Take 0.25 mLs (5 mg total) by mouth every 2 (two) hours as needed for moderate pain or shortness of breath.     neomycin-polymyxin b-dexamethasone 3.5-10000-0.1 Oint  Commonly known as:  MAXITROL  Place 1 application into the left eye at bedtime.     primidone 250 MG tablet  Commonly known as:  MYSOLINE  Take 125 mg by mouth daily. Takes 1/2 tab daily     trimethoprim 100 MG tablet  Commonly known as:  TRIMPEX  Take 100 mg by mouth daily.         The results of significant diagnostics from this hospitalization (including imaging, microbiology, ancillary and laboratory) are  listed below for reference.    Significant Diagnostic Studies: Dg Abd 1 View  01/26/2016  CLINICAL DATA:  Constipation. EXAM: ABDOMEN - 1 VIEW COMPARISON:  None. FINDINGS: The lung bases are grossly clear. There is a large amount of stool throughout the colon and down into the rectum suggesting constipation. No distended small bowel loops to suggest obstruction. The soft tissue shadows are grossly maintained. The bony structures are grossly intact. Aortic calcifications are noted. IMPRESSION: Large amount of stool throughout the colon and down into the rectum suggesting constipation. Electronically Signed   By: Rudie Meyer M.D.   On: 01/26/2016 21:54   Dg Chest Port 1 View  01/26/2016  CLINICAL DATA:  Septic  uti   Pt unable  To  Communicate EXAM: PORTABLE CHEST - 1 VIEW COMPARISON:  08/29/2012 and previous  FINDINGS: Progression of right upper lobe scattered airspace disease. Left lung remains clear. Heart size normal. Prominent pulmonary outflow tract. Atheromatous aortic arch. No effusion.  No pneumothorax. Visualized skeletal structures are unremarkable. IMPRESSION: 1. Worsening right upper lobe airspace disease. Electronically Signed   By: Corlis Leak M.D.   On: 01/26/2016 12:38    Microbiology: Recent Results (from the past 240 hour(s))  Urine culture     Status: None   Collection Time: 01/26/16 12:00 PM  Result Value Ref Range Status   Specimen Description URINE, CATHETERIZED  Final   Special Requests NONE  Final   Culture >=100,000 COLONIES/mL ESCHERICHIA COLI  Final   Report Status 01/29/2016 FINAL  Final   Organism ID, Bacteria ESCHERICHIA COLI  Final      Susceptibility   Escherichia coli - MIC*    AMPICILLIN >=32 RESISTANT Resistant     CEFAZOLIN 8 SENSITIVE Sensitive     CEFTRIAXONE <=1 SENSITIVE Sensitive     CIPROFLOXACIN >=4 RESISTANT Resistant     GENTAMICIN <=1 SENSITIVE Sensitive     IMIPENEM <=0.25 SENSITIVE Sensitive     NITROFURANTOIN <=16 SENSITIVE Sensitive      TRIMETH/SULFA >=320 RESISTANT Resistant     AMPICILLIN/SULBACTAM 16 INTERMEDIATE Intermediate     PIP/TAZO <=4 SENSITIVE Sensitive     * >=100,000 COLONIES/mL ESCHERICHIA COLI  Blood Culture (routine x 2)     Status: None (Preliminary result)   Collection Time: 01/26/16 12:27 PM  Result Value Ref Range Status   Specimen Description BLOOD RIGHT ANTECUBITAL  Final   Special Requests IN PEDIATRIC BOTTLE 4CC  Final   Culture NO GROWTH 3 DAYS  Final   Report Status PENDING  Incomplete  Blood Culture (routine x 2)     Status: None (Preliminary result)   Collection Time: 01/26/16 12:27 PM  Result Value Ref Range Status   Specimen Description BLOOD LEFT HAND  Final   Special Requests IN PEDIATRIC BOTTLE 4CC  Final   Culture NO GROWTH 3 DAYS  Final   Report Status PENDING  Incomplete     Labs: Basic Metabolic Panel:  Recent Labs Lab 01/26/16 1228 01/27/16 0720 01/27/16 1457 01/27/16 2100 01/28/16 0707 01/28/16 1409 01/29/16 0552  NA 129* 128* 129* 129* 130* 128*  --   K 4.3 3.7 3.5 3.4* 3.1* 3.0* 3.2*  CL 96* 96* 97* 97* 96* 93*  --   CO2 21* 20* 19* 18* 19* 23  --   GLUCOSE 92 99 111* 97 79 152*  --   BUN 11 10 10 8 6 7   --   CREATININE 0.78 0.72 0.73 0.73 0.76 0.73  --   CALCIUM 9.2 8.2* 8.7* 8.4* 8.6* 8.8*  --   MG  --   --  1.8  --   --   --   --   PHOS  --   --  3.1  --   --   --   --    Liver Function Tests:  Recent Labs Lab 01/26/16 1228  AST 23  ALT 11*  ALKPHOS 55  BILITOT 0.7  PROT 7.0  ALBUMIN 4.0    Recent Labs Lab 01/27/16 1457  AMMONIA 55*   CBC:  Recent Labs Lab 01/26/16 1228 01/27/16 0720  WBC 5.2 8.3  NEUTROABS 3.8  --   HGB 12.0 12.0  HCT 34.2* 35.1*  MCV 96.3 95.4  PLT 215 235   Cardiac Enzymes:  Recent Labs Lab 01/26/16 2148  CKTOTAL 86  SignedPamella Pert  [facility] 01/30/2016, 11:10 AM

## 2016-01-30 NOTE — Progress Notes (Signed)
Report called to Gail,Nurse at [facility] [place]

## 2016-01-31 LAB — CULTURE, BLOOD (ROUTINE X 2)
Culture: NO GROWTH
Culture: NO GROWTH

## 2016-03-06 DEATH — deceased

## 2016-04-13 IMAGING — CR DG CHEST 1V PORT
1 series · 1 of 1 positions shown · non-contrast
Comparison: 08/29/2012 and previous

CLINICAL DATA: Septic  uti   Pt unable  To  Communicate

EXAM:
PORTABLE CHEST - 1 VIEW

[AP]
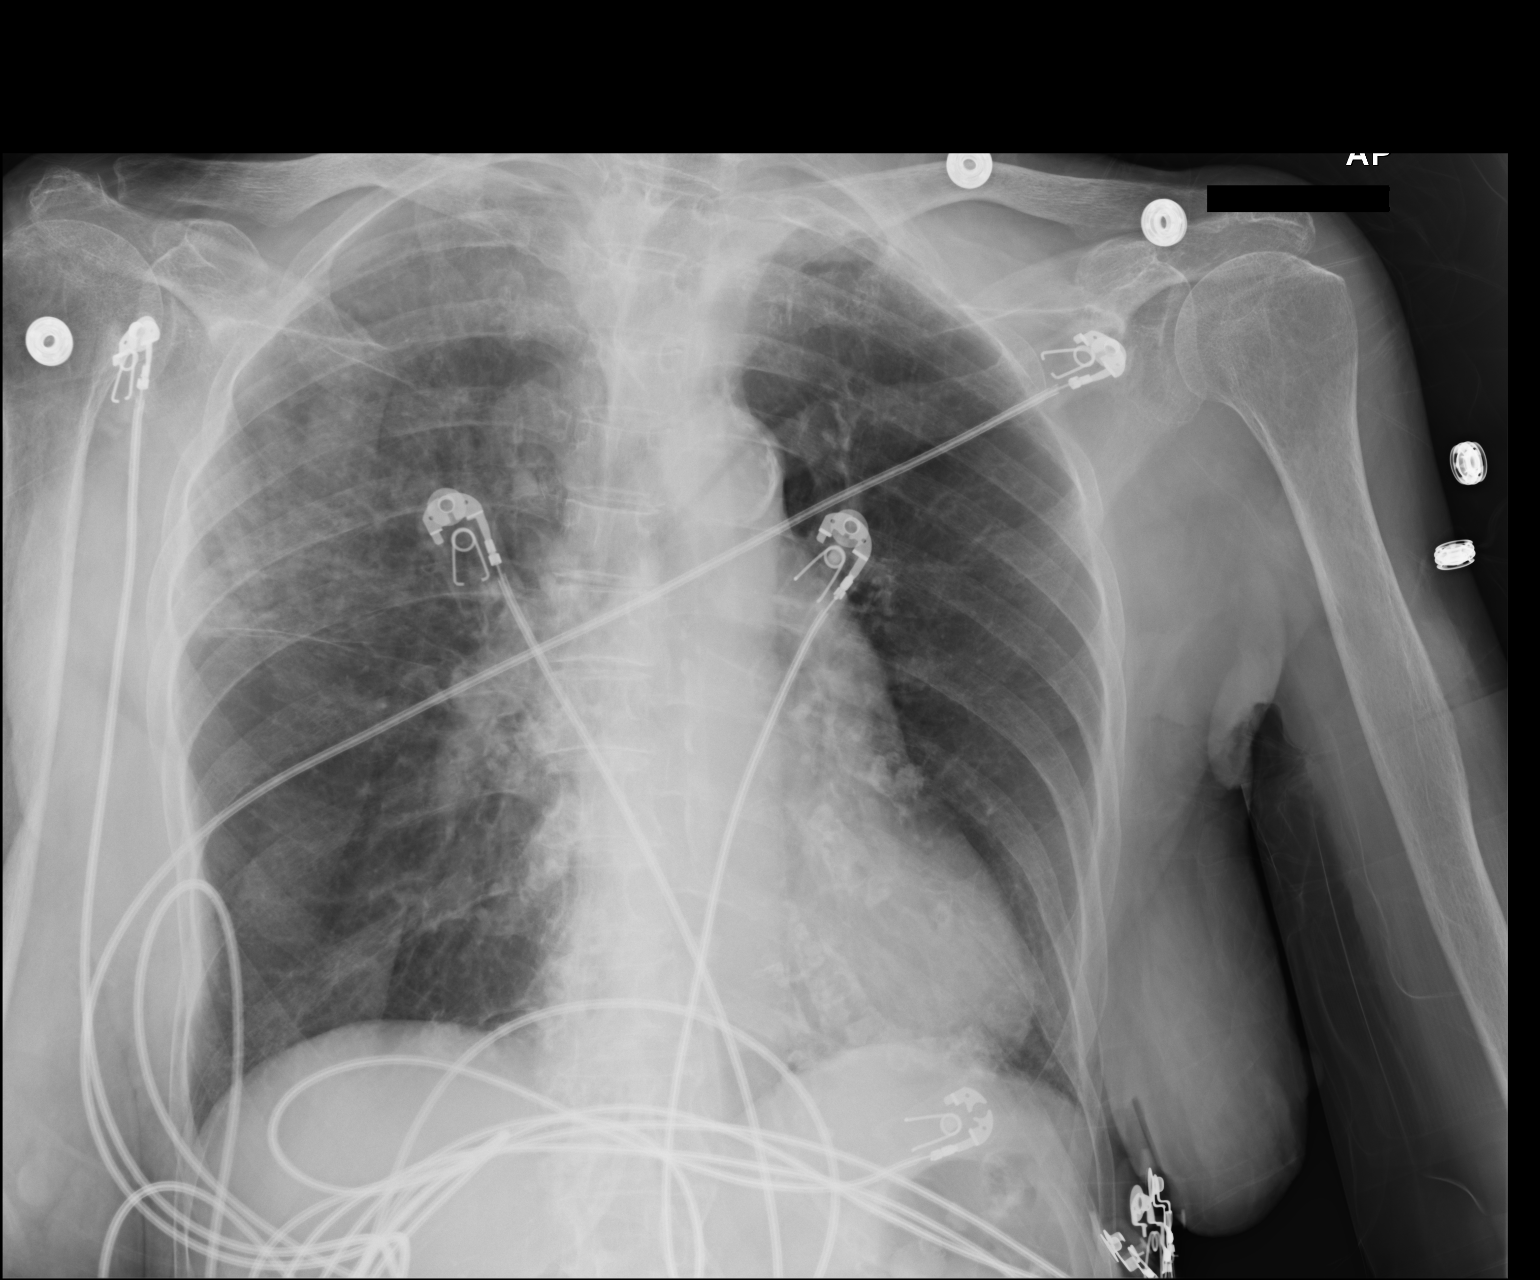

[1 of 1 positions shown; findings below may reference images not displayed]

FINDINGS: Progression of right upper lobe scattered airspace disease. Left
lung remains clear. Heart size normal. Prominent pulmonary outflow
tract. Atheromatous aortic arch.
No effusion.  No pneumothorax.
Visualized skeletal structures are unremarkable.
IMPRESSION: 1. Worsening right upper lobe airspace disease.

## 2016-04-13 IMAGING — CR DG ABDOMEN 1V
1 series · 1 of 1 positions shown · non-contrast
Comparison: None.

CLINICAL DATA: Constipation.

EXAM:
ABDOMEN - 1 VIEW

[AP]
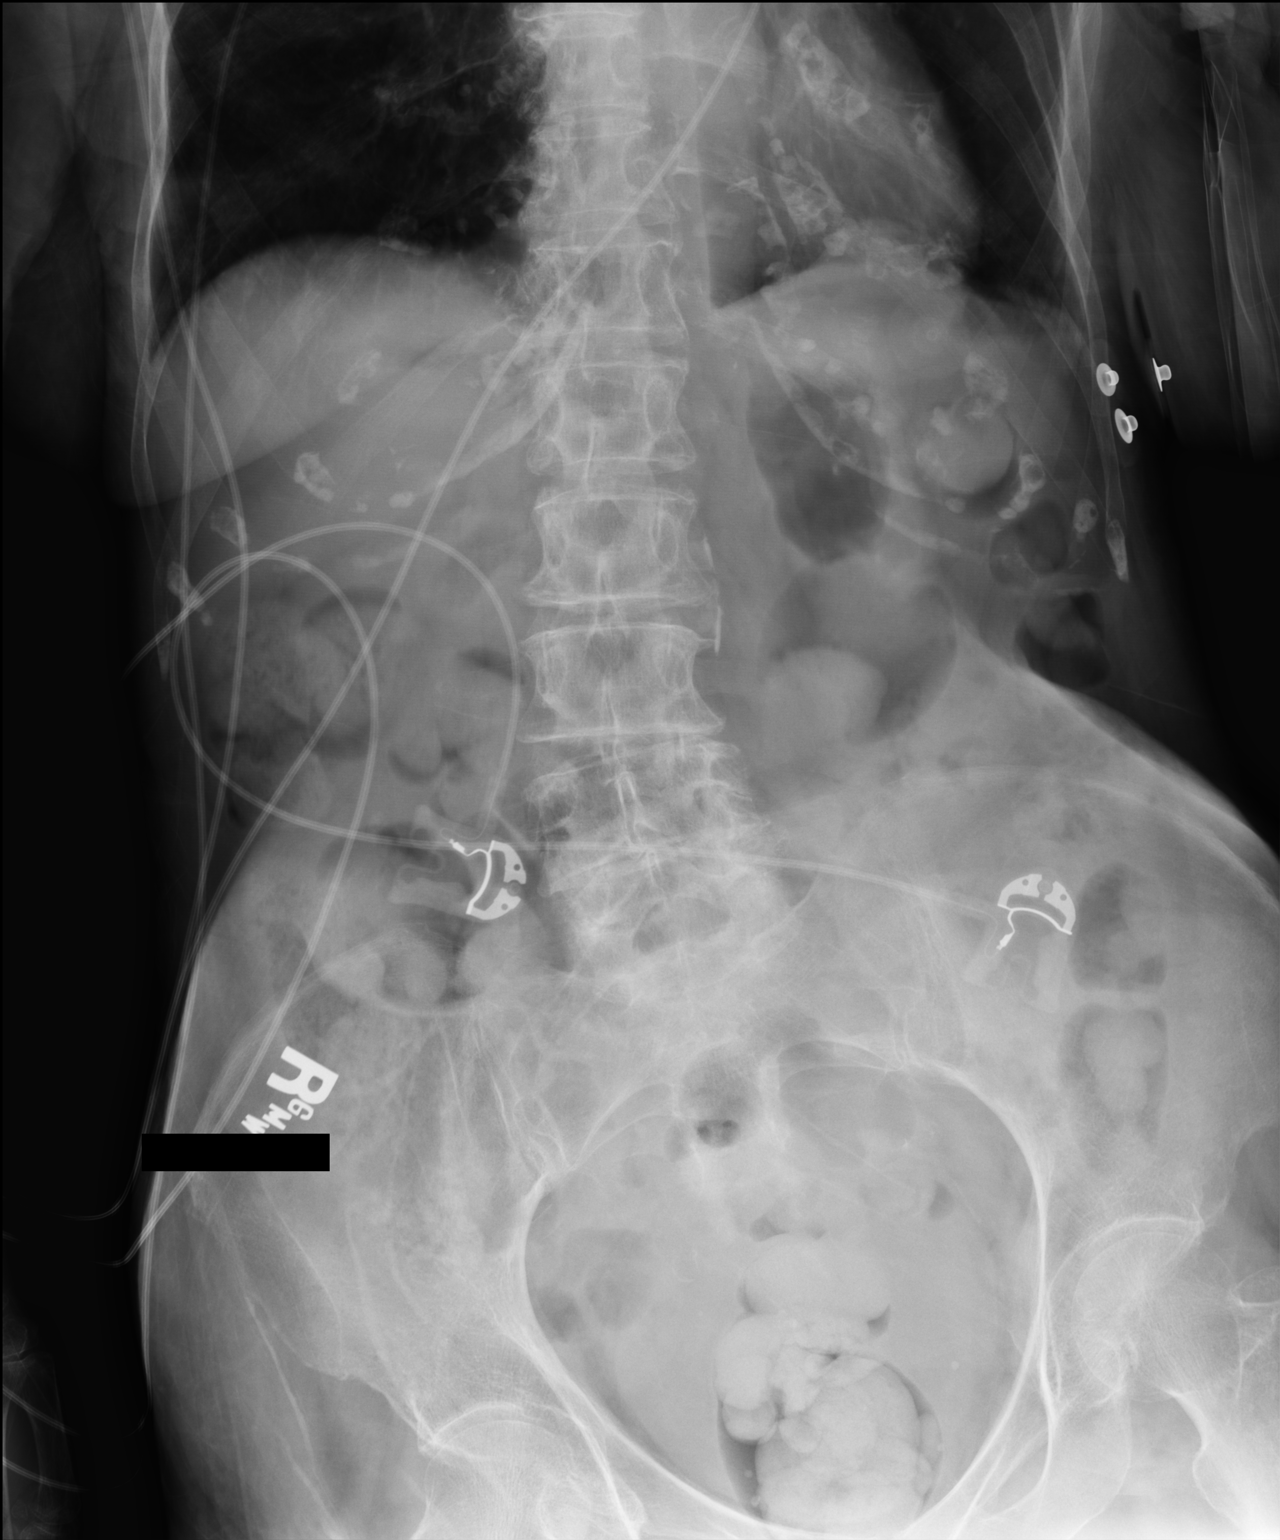

[1 of 1 positions shown; findings below may reference images not displayed]

FINDINGS: The lung bases are grossly clear. There is a large amount of stool
throughout the colon and down into the rectum suggesting
constipation. No distended small bowel loops to suggest obstruction.
The soft tissue shadows are grossly maintained. The bony structures
are grossly intact. Aortic calcifications are noted.
IMPRESSION: Large amount of stool throughout the colon and down into the rectum
suggesting constipation.
